# Patient Record
Sex: Female | Born: 1955
Health system: Southern US, Community
[De-identification: ages and names within clinical notes are randomized; demographics above are authoritative.]

## PROBLEM LIST (undated history)

## (undated) DIAGNOSIS — T7840XA Allergy, unspecified, initial encounter: Secondary | ICD-10-CM

## (undated) DIAGNOSIS — I10 Essential (primary) hypertension: Secondary | ICD-10-CM

## (undated) DIAGNOSIS — M810 Age-related osteoporosis without current pathological fracture: Secondary | ICD-10-CM

## (undated) DIAGNOSIS — E119 Type 2 diabetes mellitus without complications: Secondary | ICD-10-CM

## (undated) DIAGNOSIS — K219 Gastro-esophageal reflux disease without esophagitis: Secondary | ICD-10-CM

## (undated) DIAGNOSIS — E785 Hyperlipidemia, unspecified: Secondary | ICD-10-CM

## (undated) DIAGNOSIS — F419 Anxiety disorder, unspecified: Secondary | ICD-10-CM

## (undated) HISTORY — DX: Allergy, unspecified, initial encounter: T78.40XA

## (undated) HISTORY — DX: Essential (primary) hypertension: I10

## (undated) HISTORY — DX: Gastro-esophageal reflux disease without esophagitis: K21.9

## (undated) HISTORY — DX: Hyperlipidemia, unspecified: E78.5

## (undated) HISTORY — PX: OTHER SURGICAL HISTORY: SHX169

## (undated) HISTORY — DX: Anxiety disorder, unspecified: F41.9

## (undated) HISTORY — DX: Age-related osteoporosis without current pathological fracture: M81.0

## (undated) HISTORY — DX: Type 2 diabetes mellitus without complications: E11.9

## (undated) HISTORY — PX: TUBAL LIGATION: SHX77

## (undated) HISTORY — PX: POLYPECTOMY: SHX149

---

## 1998-04-21 ENCOUNTER — Other Ambulatory Visit: Admission: RE | Admit: 1998-04-21 | Discharge: 1998-04-21 | Payer: Self-pay | Admitting: Obstetrics and Gynecology

## 1999-12-21 ENCOUNTER — Encounter: Admission: RE | Admit: 1999-12-21 | Discharge: 1999-12-21 | Payer: Self-pay | Admitting: Obstetrics and Gynecology

## 1999-12-21 ENCOUNTER — Encounter: Payer: Self-pay | Admitting: Obstetrics and Gynecology

## 2000-12-26 ENCOUNTER — Encounter: Payer: Self-pay | Admitting: Obstetrics and Gynecology

## 2000-12-26 ENCOUNTER — Encounter: Admission: RE | Admit: 2000-12-26 | Discharge: 2000-12-26 | Payer: Self-pay | Admitting: Obstetrics and Gynecology

## 2002-01-02 ENCOUNTER — Encounter: Admission: RE | Admit: 2002-01-02 | Discharge: 2002-01-02 | Payer: Self-pay | Admitting: Obstetrics and Gynecology

## 2002-01-02 ENCOUNTER — Encounter: Payer: Self-pay | Admitting: Obstetrics and Gynecology

## 2002-11-13 ENCOUNTER — Ambulatory Visit (HOSPITAL_COMMUNITY): Admission: RE | Admit: 2002-11-13 | Discharge: 2002-11-13 | Payer: Self-pay | Admitting: Gastroenterology

## 2003-01-16 ENCOUNTER — Encounter: Payer: Self-pay | Admitting: Obstetrics and Gynecology

## 2003-01-16 ENCOUNTER — Encounter: Admission: RE | Admit: 2003-01-16 | Discharge: 2003-01-16 | Payer: Self-pay | Admitting: Obstetrics and Gynecology

## 2004-01-26 ENCOUNTER — Encounter: Admission: RE | Admit: 2004-01-26 | Discharge: 2004-01-26 | Payer: Self-pay | Admitting: Obstetrics and Gynecology

## 2005-01-27 ENCOUNTER — Encounter: Admission: RE | Admit: 2005-01-27 | Discharge: 2005-01-27 | Payer: Self-pay | Admitting: Obstetrics and Gynecology

## 2006-02-07 ENCOUNTER — Encounter: Admission: RE | Admit: 2006-02-07 | Discharge: 2006-02-07 | Payer: Self-pay | Admitting: Obstetrics and Gynecology

## 2007-02-21 ENCOUNTER — Encounter: Admission: RE | Admit: 2007-02-21 | Discharge: 2007-02-21 | Payer: Self-pay | Admitting: Obstetrics and Gynecology

## 2008-02-28 ENCOUNTER — Encounter: Admission: RE | Admit: 2008-02-28 | Discharge: 2008-02-28 | Payer: Self-pay | Admitting: Obstetrics and Gynecology

## 2009-03-02 ENCOUNTER — Encounter: Admission: RE | Admit: 2009-03-02 | Discharge: 2009-03-02 | Payer: Self-pay | Admitting: Obstetrics and Gynecology

## 2009-04-23 HISTORY — PX: COLONOSCOPY W/ POLYPECTOMY: SHX1380

## 2010-03-04 ENCOUNTER — Encounter: Admission: RE | Admit: 2010-03-04 | Discharge: 2010-03-04 | Payer: Self-pay | Admitting: Obstetrics and Gynecology

## 2010-09-29 ENCOUNTER — Ambulatory Visit: Payer: Self-pay | Admitting: Internal Medicine

## 2010-09-29 DIAGNOSIS — F411 Generalized anxiety disorder: Secondary | ICD-10-CM | POA: Insufficient documentation

## 2010-09-29 DIAGNOSIS — I10 Essential (primary) hypertension: Secondary | ICD-10-CM | POA: Insufficient documentation

## 2010-09-29 DIAGNOSIS — E559 Vitamin D deficiency, unspecified: Secondary | ICD-10-CM | POA: Insufficient documentation

## 2010-09-29 DIAGNOSIS — K219 Gastro-esophageal reflux disease without esophagitis: Secondary | ICD-10-CM

## 2010-09-30 ENCOUNTER — Ambulatory Visit: Payer: Self-pay | Admitting: Internal Medicine

## 2010-10-04 LAB — CONVERTED CEMR LAB
AST: 37 units/L (ref 0–37)
Alkaline Phosphatase: 110 units/L (ref 39–117)
Basophils Absolute: 0 10*3/uL (ref 0.0–0.1)
CO2: 28 meq/L (ref 19–32)
Chloride: 107 meq/L (ref 96–112)
Cholesterol: 172 mg/dL (ref 0–200)
Eosinophils Relative: 3 % (ref 0.0–5.0)
Glucose, Bld: 98 mg/dL (ref 70–99)
Hemoglobin: 14.4 g/dL (ref 12.0–15.0)
Lymphocytes Relative: 24.1 % (ref 12.0–46.0)
Lymphs Abs: 1.7 10*3/uL (ref 0.7–4.0)
MCV: 88.1 fL (ref 78.0–100.0)
Monocytes Absolute: 0.7 10*3/uL (ref 0.1–1.0)
Monocytes Relative: 10.3 % (ref 3.0–12.0)
Neutrophils Relative %: 62.4 % (ref 43.0–77.0)
Platelets: 248 10*3/uL (ref 150.0–400.0)
Potassium: 4.2 meq/L (ref 3.5–5.1)
RDW: 13.7 % (ref 11.5–14.6)
Sodium: 144 meq/L (ref 135–145)
Total Bilirubin: 0.2 mg/dL — ABNORMAL LOW (ref 0.3–1.2)
Total CHOL/HDL Ratio: 5
Total Protein: 6.4 g/dL (ref 6.0–8.3)
WBC: 7.1 10*3/uL (ref 4.5–10.5)

## 2010-11-18 ENCOUNTER — Other Ambulatory Visit (HOSPITAL_COMMUNITY): Payer: Self-pay | Admitting: *Deleted

## 2010-11-18 ENCOUNTER — Other Ambulatory Visit: Payer: Self-pay

## 2010-11-18 ENCOUNTER — Other Ambulatory Visit: Payer: Self-pay | Admitting: Obstetrics and Gynecology

## 2010-11-18 DIAGNOSIS — R1032 Left lower quadrant pain: Secondary | ICD-10-CM

## 2010-11-23 NOTE — Assessment & Plan Note (Signed)
Summary: new to est/diarhhea/kn   Vital Signs:  Patient profile:   55 year old female Height:      62 inches Weight:      169.8 pounds BMI:     31.17 Temp:     98.3 degrees F oral Pulse rate:   60 / minute Resp:     14 per minute BP sitting:   108 / 64  (left arm) Cuff size:   large  Vitals Entered By: Shonna Chock CMA (September 29, 2010 3:27 PM) CC: New Patient Establish: Diarrhea off/on since Aug   CC:  New Patient Establish: Diarrhea off/on since Aug.  History of Present Illness:    Miranda Gonzales is here to assess diarrhea, present intermittently since 05/2010. She has had  4 separate episodes, the last was 12/3. The first 2 episodes lasted up to 6 hrs & resoved w/o treatment,  the last 2 were responsive to Immodium within 2 hrs. The patient reports >6 stools per day, watery/unformed stools, voluminous stools, mucus in stool, fecal urgency,minimal  fecal soiling, nocturnal diarrhea, fasting diarrhea, and gassiness, but denies blood in stool, greasy stools, malodorous stools, alternating diarrhea/constipation, bloating, and abrupt onset of symptoms.  Associated symptoms include abdominal cramps  with 3rd & 4th episodes.  The patient denies fever, abdominal pain, vomiting, lightheadedness, and increased thirst.  The symptoms are  not worse with specific foods. She has no  risk factors for diarrhea such as  eating suspicious food, antibiotics or travel.  Her father had polyps. She had 2 small polyps 2010, Dr Reece Agar.  Preventive Screening-Counseling & Management  Alcohol-Tobacco     Smoking Status: never  Caffeine-Diet-Exercise     Does Patient Exercise: no  Current Medications (verified): 1)  Benicar Hct 20-12.5 Mg Tabs (Olmesartan Medoxomil-Hctz) .Marland Kitchen.. 1 By Mouth Once Daily 2)  Lexapro 30mg  .... 1 By Mouth Once Daily  Allergies (verified): 1)  ! Pcn  Past History:  Past Medical History: Anxiety, job related  Hypertension GERD  Past Surgical History: Colon  polypectomy Tubal ligation; G 0 P 0, Dr Elana Alm   Family History: Father: polyps, MI @ 55,cns hemorrhage Mother: DM, rectal cancer Siblings: sister:elevated  lipids, HTN  Social History: Occupation: Arts administrator Married Never Smoked Alcohol use-no Regular exercise-no Smoking Status:  never Does Patient Exercise:  no  Review of Systems  The patient denies anorexia, vision loss, decreased hearing, hoarseness, chest pain, dyspnea on exertion, peripheral edema, prolonged cough, hematuria, suspicious skin lesions, unusual weight change, abnormal bleeding, enlarged lymph nodes, and angioedema.         Weight loss of 7# with 1st episode. No  coke  colored urine; no clay colored stool. MS:  Denies joint pain, joint redness, and joint swelling. Derm:  Denies lesion(s) and rash.  Physical Exam  General:  well-nourished,in no acute distress; alert,appropriate and cooperative throughout examination Eyes:  No corneal or conjunctival inflammation noted. Perrla. No icterus; no lid lag Mouth:  Oral mucosa and oropharynx without lesions or exudates.  Teeth in good repair. No pharyngeal erythema.   Neck:  No deformities, masses, or tenderness noted. Lungs:  Normal respiratory effort, chest expands symmetrically. Lungs are clear to auscultation, no crackles or wheezes. Heart:  normal rate, regular rhythm, no gallop, no rub, no JVD, no HJR, and grade 1/2  /6 systolic murmur.   Abdomen:  Bowel sounds positive,abdomen soft and non-tender without masses, organomegaly or hernias noted. Pulses:  R and L carotid,radial,dorsalis pedis and posterior tibial pulses are  full and equal bilaterally Extremities:  No clubbing, cyanosis, edema, or deformity noted with normal full range of motion of all joints.   No onycholysis Neurologic:  alert & oriented X3 and DTRs symmetrical and normal.   Skin:  Intact without suspicious lesions or rashes; slightly damp Cervical Nodes:  No lymphadenopathy noted Axillary  Nodes:  No palpable lymphadenopathy Psych:  memory intact for recent and remote, normally interactive, and good eye contact.     Impression & Recommendations:  Problem # 1:  DIARRHEA (ICD-787.91)  Problem # 2:  HYPERTENSION (ICD-401.9) controlled Her updated medication list for this problem includes:    Benicar Hct 20-12.5 Mg Tabs (Olmesartan medoxomil-hctz) .Marland Kitchen... 1 by mouth once daily  Problem # 3:  VITAMIN D DEFICIENCY (ICD-268.9)  Problem # 4:  GERD (ICD-530.81)  controlled  Problem # 5:  ISCHEMIC HEART DISEASE, PREMATURE, FAMILY HX (ICD-V17.3) Father MI @ 47  Complete Medication List: 1)  Benicar Hct 20-12.5 Mg Tabs (Olmesartan medoxomil-hctz) .Marland Kitchen.. 1 by mouth once daily 2)  Lexapro 30mg   .... 1 by mouth once daily  Patient Instructions: 1)  Use "retroactive" food diary as discussed  if diarrhea recurs . Consider fasting labs: 2)  BMP ; 3)  Hepatic Panel; 4)  Lipid Panel ; 5)  TSH, free T4 ; 6)  CBC w/ Diff . 7)  It is important that you exercise regularly at least 20 minutes 5 times a week. If you develop chest pain, have severe difficulty breathing, or feel very tired , stop exercising immediately and seek medical attention.   Orders Added: 1)  New Patient Level IV [99204]  Appended Document: new to est/diarhhea/kn Patient dropped off note indicating that she checked her meds at home and she is taking 10mg  of Lexapro and not 30mg .Shonna Chock CMA  September 30, 2010 11:19 AM

## 2010-11-24 ENCOUNTER — Ambulatory Visit (HOSPITAL_COMMUNITY)
Admission: RE | Admit: 2010-11-24 | Discharge: 2010-11-24 | Disposition: A | Discharge status: Home / Self Care | Payer: Federal, State, Local not specified - PPO | Source: Ambulatory Visit | Attending: Internal Medicine | Admitting: Internal Medicine

## 2010-11-24 DIAGNOSIS — R1032 Left lower quadrant pain: Secondary | ICD-10-CM

## 2010-12-02 ENCOUNTER — Telehealth (INDEPENDENT_AMBULATORY_CARE_PROVIDER_SITE_OTHER): Payer: Self-pay | Admitting: *Deleted

## 2010-12-06 ENCOUNTER — Other Ambulatory Visit: Payer: Self-pay | Admitting: Internal Medicine

## 2010-12-06 ENCOUNTER — Encounter (INDEPENDENT_AMBULATORY_CARE_PROVIDER_SITE_OTHER): Payer: Self-pay | Admitting: *Deleted

## 2010-12-06 ENCOUNTER — Other Ambulatory Visit (INDEPENDENT_AMBULATORY_CARE_PROVIDER_SITE_OTHER): Payer: Federal, State, Local not specified - PPO

## 2010-12-06 DIAGNOSIS — R740 Nonspecific elevation of levels of transaminase and lactic acid dehydrogenase [LDH]: Secondary | ICD-10-CM

## 2010-12-06 DIAGNOSIS — Z8249 Family history of ischemic heart disease and other diseases of the circulatory system: Secondary | ICD-10-CM

## 2010-12-06 LAB — HEPATIC FUNCTION PANEL
AST: 43 U/L — ABNORMAL HIGH (ref 0–37)
Albumin: 3.6 g/dL (ref 3.5–5.2)
Total Bilirubin: 0.3 mg/dL (ref 0.3–1.2)
Total Protein: 6.6 g/dL (ref 6.0–8.3)

## 2010-12-09 NOTE — Progress Notes (Addendum)
Summary: need info for lab dated 2/13--added corrected info  Phone Note Call from Patient   Caller: Patient Summary of Call: patient called to schedule followup labs for 20/13/2012-------see phone note=12/11----it says:   Recheck fasting AST & ALT in 8-10  weeks with a special fasting cholesterol panel([facility] Panel, 1304X)   272.4, 790.4, V17.3---what do you want to do about [facility] lab info?? Initial call taken by: Jerolyn Shin,  December 02, 2010 9:51 AM  Follow-up for Phone Call        please change [facility] panel to NMR Lipoprofile (272.4, V17.3) Follow-up by: Marga Melnick MD,  December 02, 2010 12:48 PM  Additional Follow-up for Phone Call Additional follow up Details #1::        added corrected info Additional Follow-up by: Jerolyn Shin,  December 03, 2010 3:46 PM     Appended Document: need info for lab dated 2/13--added corrected info I talked to [place] ; patient will not be charged . Therefore do [facility] Panel( the one Kieler orders). She has + FH , elevated liver enzymes (790.4) & increased lipids & is post menopausal . Fasting [facility] Panel, hepatic panel; Codes: 272.4, 790.4, V17.3, 627.9). [employer]

## 2010-12-20 ENCOUNTER — Encounter: Payer: Self-pay | Admitting: Internal Medicine

## 2011-01-03 ENCOUNTER — Encounter: Payer: Self-pay | Admitting: Internal Medicine

## 2011-01-03 ENCOUNTER — Ambulatory Visit (INDEPENDENT_AMBULATORY_CARE_PROVIDER_SITE_OTHER): Payer: Federal, State, Local not specified - PPO | Admitting: Internal Medicine

## 2011-01-03 DIAGNOSIS — E785 Hyperlipidemia, unspecified: Secondary | ICD-10-CM

## 2011-01-03 DIAGNOSIS — Z8249 Family history of ischemic heart disease and other diseases of the circulatory system: Secondary | ICD-10-CM

## 2011-01-11 NOTE — Assessment & Plan Note (Signed)
Summary: review [facility]/kn   Vital Signs:  Patient profile:   55 year old female Weight:      168 pounds BMI:     30.84 Pulse rate:   72 / minute Resp:     12 per minute BP sitting:   112 / 70  (left arm) Cuff size:   large  Vitals Entered By: Shonna Chock CMA (January 03, 2011 3:30 PM) CC: Discuss [facility] lab, Hypertension Management, Lipid Management   CC:  Discuss [facility] lab, Hypertension Management, and Lipid Management.  History of Present Illness:     [facility] Panel reviewed ; major concern is HDL . He  denies the following symptoms: chest pain/pressure, dypsnea, palpitations, syncope, and pedal edema.  Dietary compliance has been fair.  The patient reports no exercise.  Adjunctive measures currently used by the patient include fiber, folic acid, and fish oil supplements. Her  father had MI @ 7.  Hypertension History:      Positive major cardiovascular risk factors include female age 13 years old or older, hyperlipidemia, hypertension, and family history for ischemic heart disease (males less than 56 years old).  Negative major cardiovascular risk factors include no history of diabetes and non-tobacco-user status.        Further assessment for target organ damage reveals no history of ASHD, stroke/TIA, or peripheral vascular disease.    Lipid Management History:      Positive NCEP/ATP III risk factors include female age 76 years old or older, early menopause without estrogen hormone replacement, HDL cholesterol less than 40, family history for ischemic heart disease (males less than 79 years old), and hypertension.  Negative NCEP/ATP III risk factors include non-diabetic, non-tobacco-user status, no ASHD (atherosclerotic heart disease), no prior stroke/TIA, no peripheral vascular disease, and no history of aortic aneurysm.      Current Medications (verified): 1)  Benicar Hct 20-12.5 Mg Tabs (Olmesartan Medoxomil-Hctz) .Marland Kitchen.. 1 By Mouth Once Daily 2)  Lexapro 10 Mg Tabs  (Escitalopram Oxalate) .Marland Kitchen.. 1 By Mouth Once Daily  Allergies: 1)  ! Pcn  Past History:  Past Medical History: Anxiety, job related  Hypertension GERD Hyperlipidemia: Fr[place] Study LDL goal = < 130.  Physical Exam  General:  well-nourished;alert,appropriate and cooperative throughout examination Lungs:  Normal respiratory effort, chest expands symmetrically. Lungs are clear to auscultation, no crackles or wheezes. Heart:  Normal rate and regular rhythm. S1 and S2 normal without gallop, murmur, click, rub or other extra sounds. Pulses:  R and L carotid,radial,dorsalis pedis and posterior tibial pulses are full and equal bilaterally Extremities:  No clubbing, cyanosis, edema.   Impression & Recommendations:  Problem # 1:  HYPERLIPIDEMIA (ICD-272.4)  Problem # 2:  ISCHEMIC HEART DISEASE, PREMATURE, FAMILY HX (ICD-V17.3)  Complete Medication List: 1)  Benicar Hct 20-12.5 Mg Tabs (Olmesartan medoxomil-hctz) .Marland Kitchen.. 1 by mouth once daily 2)  Lexapro 10 Mg Tabs (Escitalopram oxalate) .Marland Kitchen.. 1 by mouth once daily  Hypertension Assessment/Plan:      The patient's hypertensive risk group is category B: At least one risk factor (excluding diabetes) with no target organ damage.  Her calculated 10 year risk of coronary heart disease is 11 %.  Today's blood pressure is 112/70.    Lipid Assessment/Plan:      Based on NCEP/ATP III, the patient's risk factor category is "2 or more risk factors and a calculated 10 year CAD risk of < 20%".  The patient's lipid goals are as follows: Total cholesterol goal is 200; LDL  cholesterol goal is 130; HDL cholesterol goal is 40; Triglyceride goal is 150.    Patient Instructions: 1)  Focus on HDL raising interventions as discussed. Consider NMR Lipoprofile in 2013 to optimally assess risk. Stop vitamin E & Beta carotene.   Orders Added: 1)  Est. Patient Level III [04540]

## 2011-02-01 ENCOUNTER — Ambulatory Visit (INDEPENDENT_AMBULATORY_CARE_PROVIDER_SITE_OTHER): Payer: Federal, State, Local not specified - PPO | Admitting: Internal Medicine

## 2011-02-01 ENCOUNTER — Encounter: Payer: Self-pay | Admitting: Internal Medicine

## 2011-02-01 VITALS — BP 116/70 | HR 72 | Temp 98.2°F | Wt 162.8 lb

## 2011-02-01 DIAGNOSIS — J029 Acute pharyngitis, unspecified: Secondary | ICD-10-CM

## 2011-02-01 NOTE — Progress Notes (Signed)
  Subjective:    Patient ID: Miranda Gonzales, female    DOB: [birth year], 55 y.o.   MRN: [id number]  HPI SORE THROAT   Onset: 01/28/2011  Severity: 2 on 10 scale Better with: Echinacea & vit C did not help;but  ST is improving  Symptoms  Fever: no    Cough/URI sxs: yes, scant sputum , mainly dry Myalgias: no Headache: no Rash: no Swollen neck glands: yes    Recent Strep Exposure: no LUQ pain: no Heartburn/brash: no Allergy Sxs: no  Red Flags  Breathing difficulty: no  Trismus: no                                                                                                                                                    The Centor criteria for pharyngitis which include fever, pharyngeal exudate, tender cervical  lymphadenopathy , and absence of cough were assessed.      Review of Systems     Objective:   Physical Exam General appearance is one of good health and nourishment.See current vital signs Ears:  External ear exam shows no significant lesions or deformities.  Otoscopic examination reveals clear canals, tympanic membranes are intact bilaterally without bulging, retraction, inflammation or discharge. Eye - Pupils Equal Round Reactive to light, Conjunctiva without redness or discharge Oral exam: Dental hygiene is good; lips and gums are healthy appearing.There is no oropharyngeal erythema or exudate noted. Heart:  Normal rate and regular rhythm. S1 and S2 normal without gallop, murmur, click, rub or other extra sounds.                                                                                               Lungs:Chest clear to auscultation; no wheezes, rhonchi,rales ,or rubs present.No increased work of breathing. Lymphatic: No lymphadenopathy is noted about the head, neck,  Or axilla areas.        Assessment & Plan:  #1 pharyngitis , viral etiology   Plan: Zinc lozenges, vitamin C 2000 mg, Echinacea as discussed.

## 2011-02-01 NOTE — Patient Instructions (Signed)
Take the Immune Support as discussed ; report pain , pus & fever.

## 2011-02-03 ENCOUNTER — Telehealth: Payer: Self-pay | Admitting: *Deleted

## 2011-02-03 MED ORDER — SULFAMETHOXAZOLE-TMP DS 800-160 MG PO TABS: 1.0000 | ORAL_TABLET | Freq: Two times a day (BID) | ORAL | Status: AC

## 2011-02-03 NOTE — Telephone Encounter (Signed)
Left detailed msg rx sent to pharmacy .

## 2011-02-03 NOTE — Telephone Encounter (Signed)
Generic Septra DS one every 12 hours with  of 8 ounces of water( dispense 20 )

## 2011-02-04 ENCOUNTER — Other Ambulatory Visit: Payer: Self-pay | Admitting: Obstetrics and Gynecology

## 2011-02-04 DIAGNOSIS — Z1231 Encounter for screening mammogram for malignant neoplasm of breast: Secondary | ICD-10-CM

## 2011-02-07 ENCOUNTER — Other Ambulatory Visit (HOSPITAL_COMMUNITY): Payer: Self-pay | Admitting: Obstetrics and Gynecology

## 2011-02-08 ENCOUNTER — Other Ambulatory Visit (HOSPITAL_COMMUNITY): Payer: Self-pay | Admitting: Obstetrics and Gynecology

## 2011-02-10 ENCOUNTER — Other Ambulatory Visit (HOSPITAL_COMMUNITY): Payer: Self-pay | Admitting: Obstetrics and Gynecology

## 2011-02-14 ENCOUNTER — Other Ambulatory Visit (HOSPITAL_COMMUNITY): Payer: Self-pay | Admitting: Obstetrics and Gynecology

## 2011-03-07 ENCOUNTER — Ambulatory Visit
Admission: RE | Admit: 2011-03-07 | Discharge: 2011-03-07 | Disposition: A | Discharge status: Home / Self Care | Payer: Federal, State, Local not specified - PPO | Source: Ambulatory Visit | Attending: Obstetrics and Gynecology | Admitting: Obstetrics and Gynecology

## 2011-03-07 DIAGNOSIS — Z1231 Encounter for screening mammogram for malignant neoplasm of breast: Secondary | ICD-10-CM

## 2011-03-11 NOTE — Op Note (Signed)
NAME:  [name], [name] NO.:  [id number]   MEDICAL RECORD NO.:  [id number]                   PATIENT TYPE:  AMB   LOCATION:  ENDO                                 FACILITY:  [facility]   PHYSICIAN:  Miranda Gonzales, M.D.                DATE OF BIRTH:  05-13-1956   DATE OF PROCEDURE:  11/13/2002  DATE OF DISCHARGE:                                 OPERATIVE REPORT   PROCEDURE:  Colonoscopy.   REFERRED BY:  Miranda Gonzales, M.D.   INDICATIONS FOR PROCEDURE:  Miranda Gonzales is a 55 year old female born  12-25-1955. Miranda Gonzales has unexplained iron deficiency anemia based on a  serum Ferritin 3 NG/ML, iron saturation 3%, and hemoglobin 10 gm. After  three weeks of iron therapy, her hemoglobin rose from 10 gm to 11 gm. [name] submitted stool for guaiac testing and her stools were negative for  blood. Miranda Gonzales has not had a menstrual period since March 2003. She does  give blood to the [employer] on a regular basis.   ALLERGIES:  PENICILLIN.   CURRENT MEDICATIONS:  Iron, multivitamin, flax seed oil, soy.   PAST MEDICAL HISTORY:  Tubal ligation.   FAMILY HISTORY:  Negative for colon cancer.   HABITS:  Miranda Gonzales does not smoke cigarettes or consume alcohol.   ENDOSCOPIST:  Charolett Bumpers, M.D.   PREMEDICATION:  Versed 10 mg, Demerol 50 mg .   ENDOSCOPE:  Olympus pediatric colonoscope.   DESCRIPTION OF PROCEDURE:  After obtaining informed consent, Miranda Gonzales was  placed in the left lateral decubitus position. I administered intravenous  Demerol and intravenous Versed to achieve conscious sedation for the  procedure. The patient's blood pressure, oxygen saturation and cardiac  rhythm were monitored throughout the procedure and documented in the medical  record.   Anal inspection was normal. Digital rectal exam was normal. The Olympus  pediatric video colonoscope was introduced into the rectum and advanced to  the cecum. Colonic  preparation for the exam today was excellent.   RECTUM:  Normal.   SIGMOID COLON AND DESCENDING COLON:  Normal.   SPLENIC FLEXURE:  Normal.   TRANSVERSE COLON:  Normal.   HEPATIC FLEXURE:  Normal.   ASCENDING COLON:  Normal.   CECUM AND ILEOCECAL VALVE:  Normal.   ASSESSMENT:  Normal proctocolonoscopy to the cecum. No endoscopic evidence  for the presence of gastrointestinal bleeding or colorectal neoplasia.                                               Miranda Gonzales, M.D.    MJ/MEDQ  D:  11/13/2002  T:  11/13/2002  Job:  [id number]   cc:   Georgann Housekeeper, M.D.  [address]  [place]  [place]  [place] [address]  Fax: [phone]   S. Kyra Gonzales, M.D.  [phone] [address]  [place]  [place] [address]  Fax: [phone]

## 2011-04-28 ENCOUNTER — Other Ambulatory Visit: Payer: Self-pay | Admitting: Internal Medicine

## 2011-06-29 ENCOUNTER — Other Ambulatory Visit: Payer: Self-pay | Admitting: Internal Medicine

## 2011-06-29 MED ORDER — ESCITALOPRAM OXALATE 10 MG PO TABS: 10.0000 mg | ORAL_TABLET | Freq: Every day | ORAL | Status: DC

## 2011-06-29 NOTE — Telephone Encounter (Signed)
Rx sent in

## 2011-08-23 ENCOUNTER — Other Ambulatory Visit: Payer: Self-pay | Admitting: Internal Medicine

## 2011-12-19 ENCOUNTER — Other Ambulatory Visit: Payer: Self-pay | Admitting: Internal Medicine

## 2011-12-19 NOTE — Telephone Encounter (Signed)
Patient needs to schedule a CPX for 01/2012 or after

## 2011-12-19 NOTE — Telephone Encounter (Signed)
Patient needs to schedule a CPX for 01/2012 or after in order to continue receiving refills on med

## 2012-01-21 ENCOUNTER — Other Ambulatory Visit: Payer: Self-pay | Admitting: Internal Medicine

## 2012-01-23 ENCOUNTER — Other Ambulatory Visit: Payer: Self-pay | Admitting: Internal Medicine

## 2012-02-03 ENCOUNTER — Other Ambulatory Visit: Payer: Self-pay | Admitting: Obstetrics and Gynecology

## 2012-02-03 DIAGNOSIS — Z1231 Encounter for screening mammogram for malignant neoplasm of breast: Secondary | ICD-10-CM

## 2012-02-28 ENCOUNTER — Ambulatory Visit: Payer: Federal, State, Local not specified - PPO | Admitting: Internal Medicine

## 2012-03-05 ENCOUNTER — Ambulatory Visit: Payer: Federal, State, Local not specified - PPO | Admitting: Internal Medicine

## 2012-03-07 ENCOUNTER — Ambulatory Visit
Admission: RE | Admit: 2012-03-07 | Discharge: 2012-03-07 | Disposition: A | Discharge status: Home / Self Care | Payer: Federal, State, Local not specified - PPO | Source: Ambulatory Visit | Attending: Obstetrics and Gynecology | Admitting: Obstetrics and Gynecology

## 2012-03-07 DIAGNOSIS — Z1231 Encounter for screening mammogram for malignant neoplasm of breast: Secondary | ICD-10-CM

## 2012-03-12 ENCOUNTER — Encounter: Payer: Self-pay | Admitting: Internal Medicine

## 2012-03-12 ENCOUNTER — Ambulatory Visit (INDEPENDENT_AMBULATORY_CARE_PROVIDER_SITE_OTHER): Payer: Federal, State, Local not specified - PPO | Admitting: Internal Medicine

## 2012-03-12 VITALS — BP 110/70 | HR 75 | Ht 62.03 in | Wt 175.4 lb

## 2012-03-12 DIAGNOSIS — F411 Generalized anxiety disorder: Secondary | ICD-10-CM

## 2012-03-12 DIAGNOSIS — I10 Essential (primary) hypertension: Secondary | ICD-10-CM

## 2012-03-12 LAB — ALT: ALT: 49 U/L — ABNORMAL HIGH (ref 0–35)

## 2012-03-12 LAB — BASIC METABOLIC PANEL
BUN: 16 mg/dL (ref 6–23)
Calcium: 9.2 mg/dL (ref 8.4–10.5)
Glucose, Bld: 111 mg/dL — ABNORMAL HIGH (ref 70–99)
Sodium: 139 mEq/L (ref 135–145)

## 2012-03-12 NOTE — Patient Instructions (Addendum)
To prevent palpitations or premature beats, avoid stimulants such as decongestants, diet pills, nicotine, or caffeine (coffee, tea, cola, or chocolate) to excess.  Please try to go on My Chart within the next 24 hours to allow me to release the results directly to you.

## 2012-03-12 NOTE — Progress Notes (Signed)
Subjective:    Patient ID: Miranda Gonzales, female    DOB: Mar 11, 1956, 56 y.o.   MRN: [id number]  HPI She is here for a form completion related to her job. 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 Her training has not been detailed in this area; her references or "knowledge articles " which she has trouble comprehending.  These job stresses result in significant anxiety with associated physical signs and symptoms.   Specifically she notices pounding headache and tachycardia. She also has abdominal cramping with subsequent bowel change, loose to watery stool. This also if impacted her sleep. She states that she "cries all the time" and stays anxious/nervous.  Her initial job related symptoms were in 2011 and did improve with the SSRI Lexapro from her gynecologist.  She is concerned about the risk to her health long-term. Her father had heart disease her mother had 2 strokes.  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 She wishes to pursue this even though it would possibly involve down for a period.  She denies a constellation of headache, chest pain, flushing, and diarrhea    Review of Systems Constitutional: No fever, chills, significant weight change, fatigue, weakness or night sweats Eyes: No  blurred vision, double vision, or loss of vision  Cardiovascular: no chest pain,  syncope, nausea, sweating, claudication, or edema  Respiratory: No   dyspnea, paroxysmal nocturnal dyspnea, significant snoring, or  apnea    Gastrointestinal: No dysphagia, abdominal pain,  rectal bleeding, melena,  stool incontinence or jaundice Genitourinary: No dysuria,hematuria, pyuria, frequency Dermatologic: No rash, pruritus, urticaria, or change in color or temperature of skin Neurologic: No  vertigo, limb weakness,  gait disturbance,  memory loss, numbness or tingling Endocrine: No change in  hair/skin/ nails, excessive thirst, excessive hunger, excessive urination, or unexplained fatigue Hematologic/lymphatic: No bruising, lymphadenopathy,or  abnormal clotting      Objective:   Physical Exam Gen.: Healthy and well-nourished in appearance. Alert, appropriate and cooperative throughout exam. Head: Normocephalic without obvious abnormalities  Eyes: No corneal or conjunctival inflammation noted. No lid lag or proptosis. EOMI Mouth: Oral mucosa and oropharynx reveal no lesions or exudates. Teeth in good repair. Neck: No deformities, masses, or tenderness noted. Range of motion & thyroid normal  Lungs: Normal respiratory effort; chest expands symmetrically. Lungs are clear to auscultation without rales, wheezes, or increased work of breathing. Heart: Normal rate and rhythm. Normal S1 and S2. No gallop, click, or rub. Grade 1/6 systolic murmur . Abdomen: Bowel sounds normal; abdomen soft and nontender. No masses, organomegaly or hernias noted.Aorta palpable ; no AAA                                                                   Musculoskeletal/extremities: Tone & strength  normal.Joints normal. Nail health  good. Vascular: Carotid, radial artery, dorsalis pedis and  posterior tibial pulses are full and equal. No bruits present. Neurologic: Alert and oriented x3. Deep tendon reflexes symmetrical and normal. Fine tremor of hands Derm:no suspicious lesions or rashes. Lymph: No cervical, axillary lymphadenopathy present. Psych: Mood and affect are normal. Normally interactive  Assessment & Plan:

## 2012-03-12 NOTE — Assessment & Plan Note (Signed)
Medically it would be in her best interest to change her job  description.   Historically and clinically pheochromocytoma is not suggested Thyroid function tests will be performed along with a basic metabolic profile.

## 2012-03-12 NOTE — Assessment & Plan Note (Signed)
Blood pressure is well-controlled today; BMET will be checked

## 2012-03-13 LAB — T4, FREE: Free T4: 0.78 ng/dL (ref 0.60–1.60)

## 2012-03-13 LAB — TSH: TSH: 0.95 u[IU]/mL (ref 0.35–5.50)

## 2012-04-20 ENCOUNTER — Other Ambulatory Visit: Payer: Self-pay | Admitting: Internal Medicine

## 2012-05-18 ENCOUNTER — Other Ambulatory Visit: Payer: Self-pay | Admitting: Internal Medicine

## 2012-10-04 ENCOUNTER — Encounter: Payer: Self-pay | Admitting: Internal Medicine

## 2012-10-04 ENCOUNTER — Ambulatory Visit (INDEPENDENT_AMBULATORY_CARE_PROVIDER_SITE_OTHER): Payer: Federal, State, Local not specified - PPO | Admitting: Internal Medicine

## 2012-10-04 VITALS — BP 118/76 | HR 95 | Temp 97.9°F | Wt 172.4 lb

## 2012-10-04 DIAGNOSIS — A088 Other specified intestinal infections: Secondary | ICD-10-CM

## 2012-10-04 DIAGNOSIS — A084 Viral intestinal infection, unspecified: Secondary | ICD-10-CM

## 2012-10-04 NOTE — Progress Notes (Signed)
  Subjective:    Patient ID: Miranda Gonzales, female    DOB: 08/20/1956, 56 y.o.   MRN: [id number]  HPI Symptoms began 07/03/12 as a queasy sensation and some chills without fever. As of 9/11 she had had some cramps loose-watery stools on 3 occasions.  She has had the flu shot this year. She denies associated arthralgias or myalgias.  She's been out of work for 3 days ago and she feels that she will be well enough to return to work in the morning      Review of Systems She denies frontal headache, facial pain, sore throat, nasal purulence, cough, sputum production, dysuria, pyuria, or hematuria.     Objective:   Physical Exam General appearance:good health ;well nourished; no acute distress or increased work of breathing is present.  No  lymphadenopathy about the head, neck, or axilla noted.   Eyes: No conjunctival inflammation or lid edema is present. There is no scleral icterus.    Nose:  External nasal examination shows no deformity or inflammation. Nasal mucosa are pink and moist without lesions or exudates. No septal dislocation or deviation.No obstruction to airflow.   Oral exam: Dental hygiene is good; lips and gums are healthy appearing.There is no oropharyngeal erythema or exudate noted.   Neck:  No deformities,  masses, or tenderness noted.   Supple with full range of motion without pain.   Heart:  Normal rate and regular rhythm. S1 and S2 normal without gallop, murmur, click, rub or other extra sounds.   Lungs:Chest clear to auscultation; no wheezes, rhonchi,rales ,or rubs present.No increased work of breathing.    Abdomen: Abdomen soft without organomegaly or masses. Bowel sounds are hyperactive  Extremities:  No cyanosis, edema, or clubbing  noted    Skin: Warm & dry w/o jaundice or tenting.          Assessment & Plan:  #1 viral gastroenteritis, resolving  Plan: See recommendations

## 2012-10-04 NOTE — Patient Instructions (Addendum)
Stay on clear liquids for 48-72 hours or until bowels are normal.This would include  jello, sherbert (NOT ice cream), Lipton's chicken noodle soup(NOT cream based soups),Gatorade Lite, flat Ginger ale (without High Fructose Corn Syrup),dry toast or crackers, baked potato.No milk , dairy or grease until bowels are formed. Align , a [employer] , daily if stools are loose. Immodium AD for frankly watery stool. Report increasing pain, fever or rectal bleeding . You medically can return to work  08/05/2012

## 2012-10-20 ENCOUNTER — Other Ambulatory Visit: Payer: Self-pay | Admitting: Internal Medicine

## 2012-10-22 NOTE — Telephone Encounter (Signed)
Refill done.  

## 2012-11-15 ENCOUNTER — Other Ambulatory Visit: Payer: Self-pay | Admitting: Internal Medicine

## 2012-11-29 ENCOUNTER — Ambulatory Visit (INDEPENDENT_AMBULATORY_CARE_PROVIDER_SITE_OTHER): Payer: Federal, State, Local not specified - PPO | Admitting: Internal Medicine

## 2012-11-29 VITALS — BP 128/70 | HR 80 | Temp 98.1°F | Wt 180.0 lb

## 2012-11-29 DIAGNOSIS — J029 Acute pharyngitis, unspecified: Secondary | ICD-10-CM

## 2012-11-29 DIAGNOSIS — R05 Cough: Secondary | ICD-10-CM

## 2012-11-29 DIAGNOSIS — J069 Acute upper respiratory infection, unspecified: Secondary | ICD-10-CM

## 2012-11-29 MED ORDER — DOXYCYCLINE HYCLATE 100 MG PO TABS: 100.0000 mg | ORAL_TABLET | Freq: Two times a day (BID) | ORAL | Status: DC

## 2012-11-29 MED ORDER — FLUTICASONE PROPIONATE 50 MCG/ACT NA SUSP: 1.0000 | Freq: Two times a day (BID) | NASAL | Status: DC | PRN

## 2012-11-29 MED ORDER — HYDROCODONE-HOMATROPINE 5-1.5 MG/5ML PO SYRP: 5.0000 mL | ORAL_SOLUTION | Freq: Four times a day (QID) | ORAL | Status: DC | PRN

## 2012-11-29 NOTE — Progress Notes (Signed)
  Subjective:    Patient ID: Miranda Gonzales, female    DOB: April 23, 1956, 57 y.o.   MRN: [id number]  HPI The respiratory tract symptoms began 11/26/12 as sore throat then rhinitis, head congestion  & chest congestion  by 2/4 with ? colored sputum. Sputum is swallowed. Significant active  associated symptoms include persistent sore throat,scant green nasal purulence (mainly clear),  & ear pressure. Cough is not associated with  shortness of breath and wheezing . Some sneezing. Flu shot  current        Treatment with  Alka Seltzer Cold & throat lozenges w/o effect.   There is no history of asthma , seasonal, or perennial allergies. The patient had never smoked .                   Review of SystemsSymptoms not present include frontal headache, facial pain, dental pain,  and otic discharge Fever,chills & sweats not present . Itchy , watery eyes  were not noted.  Myalgias and arthralgias were not present     Objective:   Physical Exam General appearance:good health ;well nourished; no acute distress or increased work of breathing is present.  No  lymphadenopathy about the head, neck, or axilla noted.  Eyes: No conjunctival inflammation or lid edema is present. Ears:  External ear exam shows no significant lesions or deformities.  Otoscopic examination reveals clear canals, tympanic membranes are intact bilaterally without bulging, retraction, inflammation or discharge. Nose:  External nasal examination shows no deformity or inflammation. Nasal mucosa are pink and moist without lesions or exudates. No septal dislocation or deviation.No obstruction to airflow.  Oral exam: Dental hygiene is good; lips and gums are healthy appearing.There is no oropharyngeal erythema or exudate noted.  Neck:  No deformities, masses, or tenderness noted.    Heart:  Normal rate and regular rhythm. S1 and S2 normal without gallop, murmur, click, rub or other extra sounds.  Lungs:Chest clear to  auscultation; no wheezes, rhonchi,rales ,or rubs present.No increased work of breathing.  Brassy cough Extremities:  No cyanosis, edema, or clubbing  noted  Skin: Warm & dry .        Assessment & Plan:  #1 rhinosinusitis without significant bronchitis  Plan: Nasal hygiene interventions discussed. See prescription medications

## 2012-11-29 NOTE — Patient Instructions (Addendum)
Zicam Melts or Zinc lozenges for scratchy throat ; vitamin C 2000 mg daily; & Echinacea for 4-7 days.  Plain Mucinex (NOT D) for thick secretions ;force NON dairy fluids .   Nasal cleansing in the shower as discussed with lather of mild shampoo.After 10 seconds wash off lather while  exhaling through nostrils. Make sure that all residual soap is removed to prevent irritation.  Fluticasone 1 spray in each nostril twice a day as needed. Use the "crossover" technique into opposite nostril spraying toward opposite ear @ 45 degree angle, not straight up into nostril.  Use a Neti pot daily only  as needed for significant sinus congestion; going from open side to congested side . Plain Allegra (NOT D )  160 daily , Loratidine 10 mg , OR Zyrtec 10 mg @ bedtime  as needed for itchy eyes & sneezing.

## 2012-12-08 ENCOUNTER — Other Ambulatory Visit: Payer: Self-pay

## 2013-01-26 ENCOUNTER — Other Ambulatory Visit: Payer: Self-pay | Admitting: Internal Medicine

## 2013-01-30 ENCOUNTER — Other Ambulatory Visit: Payer: Self-pay

## 2013-01-30 DIAGNOSIS — Z1231 Encounter for screening mammogram for malignant neoplasm of breast: Secondary | ICD-10-CM

## 2013-02-14 ENCOUNTER — Other Ambulatory Visit: Payer: Self-pay | Admitting: Internal Medicine

## 2013-03-08 ENCOUNTER — Ambulatory Visit
Admission: RE | Admit: 2013-03-08 | Discharge: 2013-03-08 | Disposition: A | Discharge status: Home / Self Care | Payer: Federal, State, Local not specified - PPO | Source: Ambulatory Visit

## 2013-03-08 ENCOUNTER — Ambulatory Visit: Payer: Federal, State, Local not specified - PPO

## 2013-03-08 DIAGNOSIS — Z1231 Encounter for screening mammogram for malignant neoplasm of breast: Secondary | ICD-10-CM

## 2013-07-22 ENCOUNTER — Other Ambulatory Visit: Payer: Self-pay | Admitting: Internal Medicine

## 2013-07-23 ENCOUNTER — Other Ambulatory Visit: Payer: Self-pay | Admitting: *Deleted

## 2013-07-23 ENCOUNTER — Other Ambulatory Visit: Payer: Self-pay | Admitting: Internal Medicine

## 2013-07-23 DIAGNOSIS — I10 Essential (primary) hypertension: Secondary | ICD-10-CM

## 2013-07-23 MED ORDER — OLMESARTAN MEDOXOMIL-HCTZ 20-12.5 MG PO TABS: ORAL_TABLET | ORAL | Status: DC

## 2013-07-23 NOTE — Telephone Encounter (Signed)
Benicar refill sent to pharmacy. 

## 2013-07-23 NOTE — Telephone Encounter (Signed)
Benicar refilled for 1 month. OV is needed.

## 2013-08-21 ENCOUNTER — Other Ambulatory Visit: Payer: Self-pay | Admitting: *Deleted

## 2013-08-21 ENCOUNTER — Other Ambulatory Visit: Payer: Self-pay | Admitting: Internal Medicine

## 2013-08-21 MED ORDER — ESCITALOPRAM OXALATE 10 MG PO TABS: ORAL_TABLET | ORAL | Status: DC

## 2013-08-21 NOTE — Telephone Encounter (Signed)
Escitalopram refill sent to pharmacy #[id number], 0 refills. OV due

## 2013-08-29 ENCOUNTER — Other Ambulatory Visit: Payer: Self-pay

## 2013-09-20 ENCOUNTER — Other Ambulatory Visit: Payer: Self-pay | Admitting: Internal Medicine

## 2013-09-20 NOTE — Telephone Encounter (Signed)
Benicar refilled per protocol. OV due

## 2013-10-24 ENCOUNTER — Other Ambulatory Visit: Payer: Self-pay | Admitting: Internal Medicine

## 2013-10-25 NOTE — Telephone Encounter (Signed)
Benicar and Lexapro refilled for one month. Pt needs OV. JG//CMA

## 2013-11-21 ENCOUNTER — Other Ambulatory Visit: Payer: Self-pay | Admitting: Internal Medicine

## 2013-11-22 NOTE — Telephone Encounter (Signed)
Lexapro refilled for one month. OV due

## 2013-11-23 ENCOUNTER — Other Ambulatory Visit: Payer: Self-pay | Admitting: Internal Medicine

## 2013-11-25 NOTE — Telephone Encounter (Signed)
Benicar refilled per protocol. OV due. JG//CMA

## 2013-12-21 ENCOUNTER — Other Ambulatory Visit: Payer: Self-pay | Admitting: Internal Medicine

## 2013-12-27 ENCOUNTER — Other Ambulatory Visit: Payer: Self-pay | Admitting: *Deleted

## 2013-12-27 MED ORDER — OLMESARTAN MEDOXOMIL-HCTZ 20-12.5 MG PO TABS: ORAL_TABLET | ORAL | Status: DC

## 2013-12-27 MED ORDER — ESCITALOPRAM OXALATE 10 MG PO TABS: ORAL_TABLET | ORAL | Status: DC

## 2013-12-27 NOTE — Telephone Encounter (Signed)
Rx's sent to the pharmacy by e-script.  Pt needs complete physical.//AB/CMA

## 2014-01-19 ENCOUNTER — Other Ambulatory Visit: Payer: Self-pay | Admitting: Internal Medicine

## 2014-01-20 ENCOUNTER — Other Ambulatory Visit: Payer: Self-pay | Admitting: Internal Medicine

## 2014-01-20 MED ORDER — ESCITALOPRAM OXALATE 10 MG PO TABS: ORAL_TABLET | ORAL | Status: DC

## 2014-02-19 ENCOUNTER — Other Ambulatory Visit: Payer: Self-pay | Admitting: Internal Medicine

## 2014-02-24 ENCOUNTER — Other Ambulatory Visit: Payer: Self-pay | Admitting: Internal Medicine

## 2014-02-24 ENCOUNTER — Other Ambulatory Visit: Payer: Self-pay

## 2014-02-24 ENCOUNTER — Other Ambulatory Visit: Payer: Self-pay | Admitting: *Deleted

## 2014-02-24 DIAGNOSIS — Z1231 Encounter for screening mammogram for malignant neoplasm of breast: Secondary | ICD-10-CM

## 2014-02-24 MED ORDER — OLMESARTAN MEDOXOMIL-HCTZ 20-12.5 MG PO TABS: ORAL_TABLET | ORAL | Status: DC

## 2014-02-27 ENCOUNTER — Telehealth: Payer: Self-pay | Admitting: Internal Medicine

## 2014-02-27 ENCOUNTER — Encounter: Payer: Self-pay | Admitting: Internal Medicine

## 2014-02-27 ENCOUNTER — Ambulatory Visit (INDEPENDENT_AMBULATORY_CARE_PROVIDER_SITE_OTHER): Payer: Federal, State, Local not specified - PPO | Admitting: Internal Medicine

## 2014-02-27 ENCOUNTER — Other Ambulatory Visit (INDEPENDENT_AMBULATORY_CARE_PROVIDER_SITE_OTHER): Payer: Federal, State, Local not specified - PPO

## 2014-02-27 VITALS — BP 114/80 | HR 89 | Temp 97.9°F | Wt 193.8 lb

## 2014-02-27 DIAGNOSIS — E785 Hyperlipidemia, unspecified: Secondary | ICD-10-CM

## 2014-02-27 DIAGNOSIS — E559 Vitamin D deficiency, unspecified: Secondary | ICD-10-CM

## 2014-02-27 DIAGNOSIS — R7309 Other abnormal glucose: Secondary | ICD-10-CM

## 2014-02-27 DIAGNOSIS — M949 Disorder of cartilage, unspecified: Secondary | ICD-10-CM

## 2014-02-27 DIAGNOSIS — M899 Disorder of bone, unspecified: Secondary | ICD-10-CM

## 2014-02-27 DIAGNOSIS — I10 Essential (primary) hypertension: Secondary | ICD-10-CM

## 2014-02-27 DIAGNOSIS — K219 Gastro-esophageal reflux disease without esophagitis: Secondary | ICD-10-CM

## 2014-02-27 DIAGNOSIS — M8580 Other specified disorders of bone density and structure, unspecified site: Secondary | ICD-10-CM | POA: Insufficient documentation

## 2014-02-27 LAB — LIPID PANEL
CHOLESTEROL: 186 mg/dL (ref 0–200)
HDL: 40.6 mg/dL (ref >39.00)
LDL Cholesterol: 133 mg/dL — ABNORMAL HIGH (ref 0–99)
Total CHOL/HDL Ratio: 5
Triglycerides: 63 mg/dL (ref 0.0–149.0)
VLDL: 12.6 mg/dL (ref 0.0–40.0)

## 2014-02-27 LAB — CBC WITH DIFFERENTIAL/PLATELET
BASOS ABS: 0 10*3/uL (ref 0.0–0.1)
BASOS PCT: 0.2 % (ref 0.0–3.0)
EOS ABS: 0.2 10*3/uL (ref 0.0–0.7)
Eosinophils Relative: 2.9 % (ref 0.0–5.0)
HEMATOCRIT: 44.3 % (ref 36.0–46.0)
HEMOGLOBIN: 14.5 g/dL (ref 12.0–15.0)
LYMPHS ABS: 1.5 10*3/uL (ref 0.7–4.0)
Lymphocytes Relative: 20.4 % (ref 12.0–46.0)
MCHC: 32.8 g/dL (ref 30.0–36.0)
MCV: 86 fl (ref 78.0–100.0)
Monocytes Absolute: 0.8 10*3/uL (ref 0.1–1.0)
Monocytes Relative: 10.5 % (ref 3.0–12.0)
NEUTROS ABS: 4.9 10*3/uL (ref 1.4–7.7)
Neutrophils Relative %: 66 % (ref 43.0–77.0)
Platelets: 310 10*3/uL (ref 150.0–400.0)
RBC: 5.15 Mil/uL — AB (ref 3.87–5.11)
RDW: 13.7 % (ref 11.5–15.5)
WBC: 7.5 10*3/uL (ref 4.0–10.5)

## 2014-02-27 LAB — HEMOGLOBIN A1C: Hgb A1c MFr Bld: 6.7 % — ABNORMAL HIGH (ref 4.6–6.5)

## 2014-02-27 LAB — BASIC METABOLIC PANEL
BUN: 16 mg/dL (ref 6–23)
CO2: 30 meq/L (ref 19–32)
Calcium: 9.2 mg/dL (ref 8.4–10.5)
Chloride: 108 mEq/L (ref 96–112)
Creatinine, Ser: 0.9 mg/dL (ref 0.4–1.2)
GFR: 68.97 mL/min (ref >60.00)
Glucose, Bld: 113 mg/dL — ABNORMAL HIGH (ref 70–99)
Potassium: 4.3 mEq/L (ref 3.5–5.1)
SODIUM: 143 meq/L (ref 135–145)

## 2014-02-27 LAB — TSH: TSH: 1.61 u[IU]/mL (ref 0.35–4.50)

## 2014-02-27 LAB — HEPATIC FUNCTION PANEL
ALBUMIN: 3.6 g/dL (ref 3.5–5.2)
ALT: 37 U/L — ABNORMAL HIGH (ref 0–35)
AST: 28 U/L (ref 0–37)
Alkaline Phosphatase: 86 U/L (ref 39–117)
Bilirubin, Direct: 0.1 mg/dL (ref 0.0–0.3)
Total Bilirubin: 0.4 mg/dL (ref 0.2–1.2)
Total Protein: 6.7 g/dL (ref 6.0–8.3)

## 2014-02-27 MED ORDER — OLMESARTAN MEDOXOMIL-HCTZ 20-12.5 MG PO TABS
ORAL_TABLET | ORAL | Status: DC
Start: 2014-02-27 — End: 2014-07-21

## 2014-02-27 NOTE — Progress Notes (Signed)
Pre visit review using our clinic review tool, if applicable. No additional management support is needed unless otherwise documented below in the visit note. 

## 2014-02-27 NOTE — Patient Instructions (Addendum)
Your next office appointment will be determined based upon review of your pending labs . Those instructions will be transmitted to you through My Chart  OR  by mail;whichever process is your choice to receive results & recommendations .Minimal Blood Pressure Goal= AVERAGE < 140/90;  Ideal is an AVERAGE < 135/85. This AVERAGE should be calculated from @ least 5-7 BP readings taken @ different times of day on different days of week. You should not respond to isolated BP readings , but rather the AVERAGE for that week .Please bring your  blood pressure cuff to office visits to verify that it is reliable.It  can also be checked against the blood pressure device at the pharmacy. Finger or wrist cuffs are not dependable; an arm cuff is. Cardiovascular exercise, this can be as simple a program as walking, is recommended 30-45 minutes 3-4 times per week. If you're not exercising you should take 6-8 weeks to build up to this level. Reflux of gastric acid may be asymptomatic as this may occur mainly during sleep.The triggers for reflux  include stress; the "aspirin family" ; alcohol; peppermint; and caffeine (coffee, tea, cola, and chocolate). The aspirin family would include aspirin and the nonsteroidal agents such as ibuprofen &  Naproxen. Tylenol would not cause reflux. If having symptoms ; food & drink should be avoided for @ least 2 hours before going to bed.

## 2014-02-27 NOTE — Assessment & Plan Note (Signed)
Vit D level.

## 2014-02-27 NOTE — Assessment & Plan Note (Signed)
CBC & dif  Anti reflux measures 

## 2014-02-27 NOTE — Assessment & Plan Note (Signed)
Blood pressure goals reviewed. BMET 

## 2014-02-27 NOTE — Assessment & Plan Note (Signed)
Lipids, LFTs, TSH  Request copy NMR Lipoprofile

## 2014-02-27 NOTE — Telephone Encounter (Signed)
Patient is asking that a referral be sent to Dr. Brenton Grills with [facility] for her to have Colonoscopy.  She states that she discussed with Dr. Linna Darner about wanting to have a physical before 10 years and since her last one was in July 2010 they are needing a referral before scheduling.  Fax#: [phone]

## 2014-02-27 NOTE — Progress Notes (Signed)
   Subjective:    Patient ID: Miranda Gonzales, female    DOB: [birth year], 58 y.o.   MRN: [id number]  HPI  She is here for refill of her blood pressure medications and update of her active health issues  She is not on her blood pressure at home. She does not restrict salt. She is not exercising  She does have occasional palpitations which he relates to ingestion of tea ;these are nonexertional in nature.  Family history is positive for hypertension in mother, father, and sister. Her mother had a stroke in her 79s  Father had myocardial infarction at 18  Her advanced cholesterol testing was performed in 2001; those results aren't not available in the chart  She has GERD which is essentially stable with no worrisome signs or symptoms. Her last colonoscopy 2010. She's considering asking her gastroenterologist if he will repeat the colonoscopy this year as her mother had rectal cancer.  Her bone density was performed last year. She is unsure of the results. Values in 2012 reveal significant osteopenia with a value of -2.2 at the spine. She believes she is on 1000 international units of vitamin D daily  FBS had been up to 111 as per records     Review of Systems  Specifically denied are significant dyspepsia, dysphagia, abdominal pain, melena, rectal bleeding, or unexplained weight loss  She has no chest pain, dyspnea, claudication, edema, or paroxysmal nocturnal dyspnea.     Objective:   Physical Exam Appears healthy and well-nourished & in no acute distress  No carotid bruits are present.No neck pain distention present at 10 - 15 degrees. Thyroid normal to palpation  Heart rhythm and rate are normal with no gallop or murmur  Chest is clear with no increased work of breathing  There is no evidence of aortic aneurysm or renal artery bruits  Abdomen soft with no organomegaly or masses. No HJR  No clubbing, cyanosis or edema present.  Pedal pulses are intact   No ischemic  skin changes are present . Fingernails healthy   Alert and oriented. Strength, tone, DTRs reflexes normal          Assessment & Plan:  See Current Assessment & Plan in Problem List under specific Diagnosis

## 2014-02-27 NOTE — Assessment & Plan Note (Signed)
Results of 2014 study requested Weight bearing exercises

## 2014-02-28 ENCOUNTER — Other Ambulatory Visit: Payer: Self-pay | Admitting: Internal Medicine

## 2014-02-28 DIAGNOSIS — E785 Hyperlipidemia, unspecified: Secondary | ICD-10-CM

## 2014-02-28 DIAGNOSIS — R7402 Elevation of levels of lactic acid dehydrogenase (LDH): Secondary | ICD-10-CM | POA: Insufficient documentation

## 2014-02-28 DIAGNOSIS — R740 Nonspecific elevation of levels of transaminase and lactic acid dehydrogenase [LDH]: Secondary | ICD-10-CM

## 2014-02-28 DIAGNOSIS — E119 Type 2 diabetes mellitus without complications: Secondary | ICD-10-CM | POA: Insufficient documentation

## 2014-02-28 DIAGNOSIS — Z800 Family history of malignant neoplasm of digestive organs: Secondary | ICD-10-CM

## 2014-02-28 DIAGNOSIS — Z1211 Encounter for screening for malignant neoplasm of colon: Secondary | ICD-10-CM

## 2014-03-04 LAB — VITAMIN D 1,25 DIHYDROXY
Vitamin D 1, 25 (OH)2 Total: 99 pg/mL — ABNORMAL HIGH (ref 18–72)
Vitamin D2 1, 25 (OH)2: <8 pg/mL
Vitamin D3 1, 25 (OH)2: 99 pg/mL

## 2014-03-09 ENCOUNTER — Encounter: Payer: Self-pay | Admitting: Internal Medicine

## 2014-03-11 ENCOUNTER — Telehealth: Payer: Self-pay | Admitting: Internal Medicine

## 2014-03-11 ENCOUNTER — Ambulatory Visit
Admission: RE | Admit: 2014-03-11 | Discharge: 2014-03-11 | Disposition: A | Discharge status: Home / Self Care | Payer: Federal, State, Local not specified - PPO | Source: Ambulatory Visit

## 2014-03-11 DIAGNOSIS — Z1231 Encounter for screening mammogram for malignant neoplasm of breast: Secondary | ICD-10-CM

## 2014-03-11 NOTE — Telephone Encounter (Signed)
Rec'd from [facility] forward 12 pages to Dr.Hopper

## 2014-03-16 ENCOUNTER — Encounter: Payer: Self-pay | Admitting: Internal Medicine

## 2014-03-22 ENCOUNTER — Other Ambulatory Visit: Payer: Self-pay | Admitting: Internal Medicine

## 2014-03-24 ENCOUNTER — Encounter: Payer: Self-pay | Admitting: Internal Medicine

## 2014-03-24 NOTE — Telephone Encounter (Signed)
OK , R X2

## 2014-03-24 NOTE — Telephone Encounter (Signed)
Last office visit 02/27/2014

## 2014-04-30 ENCOUNTER — Other Ambulatory Visit: Payer: Self-pay | Admitting: Gastroenterology

## 2014-06-19 ENCOUNTER — Other Ambulatory Visit: Payer: Self-pay | Admitting: Internal Medicine

## 2014-06-20 NOTE — Telephone Encounter (Signed)
02/27/14 last office visit

## 2014-06-20 NOTE — Telephone Encounter (Signed)
OK X 3 mo 

## 2014-07-21 ENCOUNTER — Encounter (HOSPITAL_COMMUNITY): Payer: Self-pay | Admitting: Pharmacy Technician

## 2014-07-28 ENCOUNTER — Encounter (HOSPITAL_COMMUNITY): Payer: Self-pay | Admitting: *Deleted

## 2014-08-11 ENCOUNTER — Other Ambulatory Visit: Payer: Self-pay | Admitting: Gastroenterology

## 2014-08-11 NOTE — Anesthesia Preprocedure Evaluation (Addendum)
Anesthesia Evaluation  Patient identified by MRN, date of birth, ID band Patient awake    Reviewed: Allergy & Precautions, H&P , NPO status , Patient's Chart, lab work & pertinent test results  Airway Mallampati: II TM Distance: >3 FB Neck ROM: Full    Dental no notable dental hx.    Pulmonary neg pulmonary ROS,  breath sounds clear to auscultation  Pulmonary exam normal       Cardiovascular Exercise Tolerance: Good hypertension, Pt. on medications Rhythm:Regular Rate:Normal     Neuro/Psych negative neurological ROS  negative psych ROS   GI/Hepatic Neg liver ROS, GERD-  Medicated and Controlled,  Endo/Other  diabetes  Renal/GU negative Renal ROS  negative genitourinary   Musculoskeletal negative musculoskeletal ROS (+)   Abdominal   Peds negative pediatric ROS (+)  Hematology negative hematology ROS (+)   Anesthesia Other Findings   Reproductive/Obstetrics negative OB ROS                          Anesthesia Physical Anesthesia Plan  ASA: II  Anesthesia Plan: MAC   Post-op Pain Management:    Induction: Intravenous  Airway Management Planned: Nasal Cannula  Additional Equipment:   Intra-op Plan:   Post-operative Plan: Extubation in OR  Informed Consent: I have reviewed the patients History and Physical, chart, labs and discussed the procedure including the risks, benefits and alternatives for the proposed anesthesia with the patient or authorized representative who has indicated his/her understanding and acceptance.   Dental advisory given  Plan Discussed with: CRNA  Anesthesia Plan Comments:         Anesthesia Quick Evaluation

## 2014-08-12 ENCOUNTER — Ambulatory Visit (HOSPITAL_COMMUNITY)
Admission: RE | Admit: 2014-08-12 | Discharge: 2014-08-12 | Disposition: A | Discharge status: Home / Self Care | Payer: Federal, State, Local not specified - PPO | Source: Ambulatory Visit | Attending: Gastroenterology | Admitting: Gastroenterology

## 2014-08-12 ENCOUNTER — Encounter (HOSPITAL_COMMUNITY): Payer: Federal, State, Local not specified - PPO | Admitting: Anesthesiology

## 2014-08-12 ENCOUNTER — Encounter (HOSPITAL_COMMUNITY): Payer: Self-pay | Admitting: *Deleted

## 2014-08-12 ENCOUNTER — Encounter (HOSPITAL_COMMUNITY)
Admission: RE | Disposition: A | Discharge status: Home / Self Care | Payer: Self-pay | Source: Ambulatory Visit | Attending: Gastroenterology

## 2014-08-12 ENCOUNTER — Ambulatory Visit (HOSPITAL_COMMUNITY): Payer: Federal, State, Local not specified - PPO | Admitting: Anesthesiology

## 2014-08-12 DIAGNOSIS — Z800 Family history of malignant neoplasm of digestive organs: Secondary | ICD-10-CM | POA: Insufficient documentation

## 2014-08-12 DIAGNOSIS — Z1211 Encounter for screening for malignant neoplasm of colon: Secondary | ICD-10-CM | POA: Insufficient documentation

## 2014-08-12 DIAGNOSIS — Z86010 Personal history of colonic polyps: Secondary | ICD-10-CM | POA: Insufficient documentation

## 2014-08-12 DIAGNOSIS — I10 Essential (primary) hypertension: Secondary | ICD-10-CM | POA: Insufficient documentation

## 2014-08-12 HISTORY — PX: COLONOSCOPY WITH PROPOFOL: SHX5780

## 2014-08-12 SURGERY — COLONOSCOPY WITH PROPOFOL
Anesthesia: Monitor Anesthesia Care

## 2014-08-12 MED ORDER — PROPOFOL 10 MG/ML IV BOLUS
INTRAVENOUS | Status: AC
  Filled 2014-08-12: qty 20

## 2014-08-12 MED ORDER — LACTATED RINGERS IV SOLN
INTRAVENOUS | Status: DC | PRN
  Administered 2014-08-12: 11:00:00 via INTRAVENOUS

## 2014-08-12 MED ORDER — PROPOFOL 10 MG/ML IV EMUL
INTRAVENOUS | Status: DC | PRN
  Administered 2014-08-12: 60 mg via INTRAVENOUS

## 2014-08-12 MED ORDER — PROPOFOL INFUSION 10 MG/ML OPTIME
INTRAVENOUS | Status: DC | PRN
  Administered 2014-08-12: 140 ug/kg/min via INTRAVENOUS

## 2014-08-12 MED ORDER — LACTATED RINGERS IV SOLN
INTRAVENOUS | Status: DC
  Administered 2014-08-12: 1000 mL via INTRAVENOUS

## 2014-08-12 MED ORDER — LIDOCAINE HCL (CARDIAC) 20 MG/ML IV SOLN
INTRAVENOUS | Status: AC
  Filled 2014-08-12: qty 5

## 2014-08-12 SURGICAL SUPPLY — 22 items

## 2014-08-12 NOTE — H&P (Signed)
  Procedure: Surveillance colonoscopy. Colonoscopy with removal of a 5 mm ascending colon adenomatous polyp performed on 04/28/2009  History: The patient is a 58 year old female born [birth year]. Her mother was diagnosed with rectal cancer. She is scheduled to undergo a surveillance colonoscopy with polypectomy to prevent colon cancer.   Medication allergies: Penicillin  Past medical history: Hypertension. Bilateral tubal ligation.  Family history: Mother diagnosed with rectal cancer  Exam: The patient is alert and lying comfortably on the endoscopy stretcher. Abdomen is soft and nontender to palpation. Lungs are clear to auscultation. Cardiac exam reveals a regular rhythm.  Plan: Proceed with surveillance colonoscopy

## 2014-08-12 NOTE — Anesthesia Postprocedure Evaluation (Signed)
  Anesthesia Post-op Note  Patient: Miranda Gonzales  Procedure(s) Performed: Procedure(s) (LRB): COLONOSCOPY WITH PROPOFOL (N/A)  Patient Location: PACU  Anesthesia Type: MAC  Level of Consciousness: awake and alert   Airway and Oxygen Therapy: Patient Spontanous Breathing  Post-op Pain: mild  Post-op Assessment: Post-op Vital signs reviewed, Patient's Cardiovascular Status Stable, Respiratory Function Stable, Patent Airway and No signs of Nausea or vomiting  Last Vitals:  Filed Vitals:   08/12/14 1235  BP:   Pulse: 74  Temp:   Resp: 30    Post-op Vital Signs: stable   Complications: No apparent anesthesia complications

## 2014-08-12 NOTE — Discharge Instructions (Signed)
Colonoscopy, Care After °These instructions give you information on caring for yourself after your procedure. Your doctor may also give you more specific instructions. Call your doctor if you have any problems or questions after your procedure. °HOME CARE °· Do not drive for 24 hours. °· Do not sign important papers or use machinery for 24 hours. °· You may shower. °· You may go back to your usual activities, but go slower for the first 24 hours. °· Take rest breaks often during the first 24 hours. °· Walk around or use warm packs on your belly (abdomen) if you have belly cramping or gas. °· Drink enough fluids to keep your pee (urine) clear or pale yellow. °· Resume your normal diet. Avoid heavy or fried foods. °· Avoid drinking alcohol for 24 hours or as told by your doctor. °· Only take medicines as told by your doctor. °If a tissue sample (biopsy) was taken during the procedure:  °· Do not take aspirin or blood thinners for 7 days, or as told by your doctor. °· Do not drink alcohol for 7 days, or as told by your doctor. °· Eat soft foods for the first 24 hours. °GET HELP IF: °You still have a small amount of blood in your poop (stool) 2-3 days after the procedure. °GET HELP RIGHT AWAY IF: °· You have more than a small amount of blood in your poop. °· You see clumps of tissue (blood clots) in your poop. °· Your belly is puffy (swollen). °· You feel sick to your stomach (nauseous) or throw up (vomit). °· You have a fever. °· You have belly pain that gets worse and medicine does not help. °MAKE SURE YOU: °· Understand these instructions. °· Will watch your condition. °· Will get help right away if you are not doing well or get worse. °Document Released: 11/12/2010 Document Revised: 10/15/2013 Document Reviewed: 06/17/2013 °Exit[employer]® Patient Information ©2015 [employer]. This information is not intended to replace advice given to you by your health care provider. Make sure you discuss any questions you have with  your health care provider. ° °

## 2014-08-12 NOTE — Transfer of Care (Signed)
Immediate Anesthesia Transfer of Care Note  Patient: Miranda Gonzales  Procedure(s) Performed: Procedure(s): COLONOSCOPY WITH PROPOFOL (N/A)  Patient Location: PACU  Anesthesia Type:MAC  Level of Consciousness: awake, alert  and oriented  Airway & Oxygen Therapy: Patient Spontanous Breathing and Patient connected to face mask oxygen  Post-op Assessment: Report given to PACU RN and Post -op Vital signs reviewed and stable  Post vital signs: Reviewed and stable  Complications: No apparent anesthesia complications

## 2014-08-12 NOTE — Op Note (Signed)
Procedure: Surveillance colonoscopy. Colonoscopy with removal of a 5 mm ascending colon adenomatous colon polyp performed on 04/28/2009. Mother diagnosed with rectal cancer  Endoscopist: Earle Gell   Premedication: Propofol administered by anesthesia  Procedure: The patient was placed in the left lateral decubitus position. Anal inspection and digital rectal exam were normal. The Pentax pediatric colonoscope was introduced into the rectum and easily advanced to the cecum. A normal-appearing appendiceal orifice was identified. A normal-appearing ileocecal valve was identified. Colonic preparation for the exam today was good  Rectum. Normal. Retroflex view of the distal rectum normal  Sigmoid colon and descending colon. Normal  Splenic flexure. Normal  Transverse colon. Normal  Hepatic flexure. Normal  Ascending colon. Normal  Cecum and ileocecal valve. Normal  Assessment: Normal surveillance colonoscopy  Recommendation: Schedule surveillance colonoscopy in 5 years

## 2014-08-13 ENCOUNTER — Encounter (HOSPITAL_COMMUNITY): Payer: Self-pay | Admitting: Gastroenterology

## 2014-09-17 ENCOUNTER — Other Ambulatory Visit: Payer: Self-pay

## 2014-09-17 MED ORDER — ESCITALOPRAM OXALATE 10 MG PO TABS: 10.0000 mg | ORAL_TABLET | ORAL | Status: DC

## 2014-09-17 NOTE — Telephone Encounter (Signed)
OK X 3 mos 

## 2014-12-16 ENCOUNTER — Other Ambulatory Visit: Payer: Self-pay | Admitting: Internal Medicine

## 2014-12-17 ENCOUNTER — Other Ambulatory Visit: Payer: Self-pay | Admitting: Internal Medicine

## 2014-12-17 NOTE — Telephone Encounter (Signed)
Med last filled 09/17/14 #30 plus 2 refills  Last seen 02/27/14

## 2014-12-17 NOTE — Telephone Encounter (Signed)
OK X 1 but due for F/U OV

## 2015-01-16 ENCOUNTER — Other Ambulatory Visit: Payer: Self-pay | Admitting: Internal Medicine

## 2015-01-19 ENCOUNTER — Other Ambulatory Visit: Payer: Self-pay

## 2015-01-19 NOTE — Telephone Encounter (Signed)
Last seen 5/15  This is last refill

## 2015-01-19 NOTE — Telephone Encounter (Signed)
OK #30 Needs OV PTR ; last seen 5/15

## 2015-01-30 ENCOUNTER — Other Ambulatory Visit: Payer: Self-pay

## 2015-01-30 DIAGNOSIS — Z1231 Encounter for screening mammogram for malignant neoplasm of breast: Secondary | ICD-10-CM

## 2015-02-15 ENCOUNTER — Other Ambulatory Visit: Payer: Self-pay | Admitting: Internal Medicine

## 2015-02-16 NOTE — Telephone Encounter (Signed)
#  30 Last refiull w/o OV ; last seen 5/15

## 2015-03-13 ENCOUNTER — Ambulatory Visit
Admission: RE | Admit: 2015-03-13 | Discharge: 2015-03-13 | Disposition: A | Discharge status: Home / Self Care | Payer: Federal, State, Local not specified - PPO | Source: Ambulatory Visit

## 2015-03-13 DIAGNOSIS — Z1231 Encounter for screening mammogram for malignant neoplasm of breast: Secondary | ICD-10-CM

## 2015-03-16 ENCOUNTER — Telehealth: Payer: Self-pay | Admitting: Internal Medicine

## 2015-03-19 NOTE — Telephone Encounter (Signed)
I scheduled patient for 6/1, but she will run out of medication that was requested before then. Can you please call in enough for her. Pharmacy is [facility] on [address]

## 2015-03-20 ENCOUNTER — Other Ambulatory Visit: Payer: Self-pay

## 2015-03-20 ENCOUNTER — Other Ambulatory Visit: Payer: Self-pay | Admitting: Internal Medicine

## 2015-03-20 MED ORDER — OLMESARTAN MEDOXOMIL-HCTZ 20-12.5 MG PO TABS: 1.0000 | ORAL_TABLET | ORAL | Status: DC

## 2015-03-20 MED ORDER — ESCITALOPRAM OXALATE 10 MG PO TABS: ORAL_TABLET | ORAL | Status: DC

## 2015-03-20 NOTE — Telephone Encounter (Signed)
benicar and lexapro rx called in to pharm

## 2015-03-20 NOTE — Telephone Encounter (Signed)
benecar rx sent to pharm

## 2015-03-20 NOTE — Telephone Encounter (Signed)
Please advise, thanks.

## 2015-03-20 NOTE — Telephone Encounter (Signed)
OK #30 of each

## 2015-03-25 ENCOUNTER — Other Ambulatory Visit: Payer: Self-pay | Admitting: Internal Medicine

## 2015-03-25 ENCOUNTER — Other Ambulatory Visit (INDEPENDENT_AMBULATORY_CARE_PROVIDER_SITE_OTHER): Payer: Federal, State, Local not specified - PPO

## 2015-03-25 ENCOUNTER — Encounter: Payer: Self-pay | Admitting: Internal Medicine

## 2015-03-25 ENCOUNTER — Ambulatory Visit (INDEPENDENT_AMBULATORY_CARE_PROVIDER_SITE_OTHER): Payer: Federal, State, Local not specified - PPO | Admitting: Internal Medicine

## 2015-03-25 VITALS — BP 118/72 | HR 78 | Temp 97.6°F | Wt 192.1 lb

## 2015-03-25 DIAGNOSIS — E785 Hyperlipidemia, unspecified: Secondary | ICD-10-CM | POA: Diagnosis not present

## 2015-03-25 DIAGNOSIS — F411 Generalized anxiety disorder: Secondary | ICD-10-CM | POA: Diagnosis not present

## 2015-03-25 DIAGNOSIS — E119 Type 2 diabetes mellitus without complications: Secondary | ICD-10-CM

## 2015-03-25 DIAGNOSIS — E559 Vitamin D deficiency, unspecified: Secondary | ICD-10-CM

## 2015-03-25 DIAGNOSIS — I10 Essential (primary) hypertension: Secondary | ICD-10-CM | POA: Diagnosis not present

## 2015-03-25 LAB — HEPATIC FUNCTION PANEL
ALT: 37 U/L — ABNORMAL HIGH (ref 0–35)
AST: 23 U/L (ref 0–37)
Albumin: 4 g/dL (ref 3.5–5.2)
Alkaline Phosphatase: 108 U/L (ref 39–117)
BILIRUBIN TOTAL: 0.4 mg/dL (ref 0.2–1.2)
Bilirubin, Direct: 0 mg/dL (ref 0.0–0.3)
TOTAL PROTEIN: 7.2 g/dL (ref 6.0–8.3)

## 2015-03-25 LAB — BASIC METABOLIC PANEL
BUN: 16 mg/dL (ref 6–23)
CALCIUM: 9.5 mg/dL (ref 8.4–10.5)
CO2: 31 mEq/L (ref 19–32)
Chloride: 104 mEq/L (ref 96–112)
Creatinine, Ser: 0.85 mg/dL (ref 0.40–1.20)
GFR: 72.46 mL/min (ref >60.00)
Glucose, Bld: 121 mg/dL — ABNORMAL HIGH (ref 70–99)
POTASSIUM: 4.8 meq/L (ref 3.5–5.1)
Sodium: 140 mEq/L (ref 135–145)

## 2015-03-25 LAB — TSH: TSH: 2.05 u[IU]/mL (ref 0.35–4.50)

## 2015-03-25 LAB — MICROALBUMIN / CREATININE URINE RATIO
Creatinine,U: 39.5 mg/dL
MICROALB/CREAT RATIO: 1.8 mg/g (ref 0.0–30.0)
Microalb, Ur: <0.7 mg/dL (ref 0.0–1.9)

## 2015-03-25 LAB — VITAMIN D 25 HYDROXY (VIT D DEFICIENCY, FRACTURES): VITD: 17.98 ng/mL — ABNORMAL LOW (ref 30.00–100.00)

## 2015-03-25 LAB — HEMOGLOBIN A1C: HEMOGLOBIN A1C: 6.4 % (ref 4.6–6.5)

## 2015-03-25 NOTE — Assessment & Plan Note (Signed)
Blood pressure goals reviewed. BMET 

## 2015-03-25 NOTE — Patient Instructions (Signed)
  Your next office appointment will be determined based upon review of your pending labs  and  xrays  Those written interpretation of the lab results and instructions will be transmitted to you by mail for your records.  Critical results will be called.   Followup as needed for any active or acute issue. Please report any significant change in your symptoms.  Minimal Blood Pressure Goal= AVERAGE < 140/90;  Ideal is an AVERAGE < 135/85. This AVERAGE should be calculated from @ least 5-7 BP readings taken @ different times of day on different days of week. You should not respond to isolated BP readings , but rather the AVERAGE for that week .Please bring your  blood pressure cuff to office visits to verify that it is reliable.It  can also be checked against the blood pressure device at the pharmacy. Finger or wrist cuffs are not dependable; an arm cuff is.

## 2015-03-25 NOTE — Assessment & Plan Note (Signed)
NMR LipoProfile, hepatic function, TSH

## 2015-03-25 NOTE — Assessment & Plan Note (Signed)
A1c , urine microalbumin, BMET 

## 2015-03-25 NOTE — Assessment & Plan Note (Signed)
   Renew generic Lexapro as it is a low dose and she has excellent response with no adverse effects

## 2015-03-25 NOTE — Progress Notes (Signed)
   Subjective:    Patient ID: Miranda Gonzales, female    DOB: [birth year], 59 y.o.   MRN: [id number]  HPI  The patient is here to assess status of active health conditions.  PMH, FH, & Social History reviewed & updated.  She has been compliant with medications without adverse effects. He does eat red meat; she does not restrict salt. She does not exercise.  She's never smoked and does not drink.  Family history includes myocardial infarction her father @ 62 & 20. Mother had stroke in her 32s. Her mother and sister had hypertension. Her mother also had diabetes.  Colonoscopy was performed 2015 and was negative.She had a polyp in 2010.Father had polyps and mother rectal cancer.  She is on 1000 international units vitamin D daily.   She's been compliant with the generic low-dose Lexapro. She finds that keeps her calm and helps her handle stresses at work.  She has no active cardiovascular, psychiatric, GI, or diabetic related symptoms.   Review of Systems  Chest pain, palpitations, tachycardia, exertional dyspnea, paroxysmal nocturnal dyspnea, claudication or edema are absent. No unexplained weight loss, abdominal pain, significant dyspepsia, dysphagia, melena, rectal bleeding, or persistently small caliber stools. Dysuria, pyuria, hematuria, frequency, nocturia or polyuria are denied. Change in hair, skin, nails denied. No bowel changes of constipation or diarrhea. No intolerance to heat or cold.    Objective:   Physical Exam   Pertinent or positive findings include: Wax present in the right canal partially obscuring the TM. Abdomen is protuberant. Crepitus of knees, left greater than right.  General appearance :adequately nourished; in no distress. Eyes: No conjunctival inflammation or scleral icterus is present. Oral exam:  Lips and gums are healthy appearing.There is no oropharyngeal erythema or exudate noted. Dental hygiene is good. Heart:  Normal rate and regular rhythm. S1  and S2 normal without gallop, murmur, click, rub or other extra sounds   Lungs:Chest clear to auscultation; no wheezes, rhonchi,rales ,or rubs present.No increased work of breathing.  Abdomen: bowel sounds normal, soft and non-tender without masses, organomegaly or hernias noted.  No guarding or rebound.  Vascular : all pulses equal ; no bruits present. Skin:Warm & dry.  Intact without suspicious lesions or rashes ; no tenting or jaundice  Lymphatic: No lymphadenopathy is noted about the head, neck, axilla Neuro: Strength, tone & DTRs normal.        Assessment & Plan:  See Current Assessment & Plan in Problem List under specific Diagnosis

## 2015-03-27 LAB — NMR LIPOPROFILE WITH LIPIDS
CHOLESTEROL, TOTAL: 201 mg/dL — AB (ref 100–199)
HDL Particle Number: 29.4 umol/L — ABNORMAL LOW (ref >=30.5)
HDL Size: 8.7 nm — ABNORMAL LOW (ref >=9.2)
HDL-C: 50 mg/dL (ref >39)
LARGE HDL: 2.1 umol/L — AB (ref >=4.8)
LARGE VLDL-P: 3.8 nmol/L — AB (ref <=2.7)
LDL (calc): 130 mg/dL — ABNORMAL HIGH (ref 0–99)
LDL Particle Number: 1613 nmol/L — ABNORMAL HIGH (ref <1000)
LDL SIZE: 21.8 nm (ref >=20.8)
LP-IR Score: 61 — ABNORMAL HIGH (ref <=45)
SMALL LDL PARTICLE NUMBER: 368 nmol/L (ref <=527)
Triglycerides: 106 mg/dL (ref 0–149)
VLDL SIZE: 47.3 nm — AB (ref <=46.6)

## 2015-03-30 ENCOUNTER — Telehealth: Payer: Self-pay | Admitting: Internal Medicine

## 2015-03-30 ENCOUNTER — Other Ambulatory Visit: Payer: Self-pay | Admitting: Internal Medicine

## 2015-03-30 DIAGNOSIS — E785 Hyperlipidemia, unspecified: Secondary | ICD-10-CM

## 2015-03-30 MED ORDER — SIMVASTATIN 20 MG PO TABS: 20.0000 mg | ORAL_TABLET | Freq: Every day | ORAL | Status: DC

## 2015-03-30 NOTE — Telephone Encounter (Signed)
Patient aware and will come in around the beginning of August for fasting labs.

## 2015-03-30 NOTE — Telephone Encounter (Signed)
Rx sent Fasting labs in 10 weeks

## 2015-03-30 NOTE — Telephone Encounter (Signed)
Patient would like to start a statin for cholesterol. Please advise, thanks.

## 2015-03-30 NOTE — Telephone Encounter (Signed)
Patient states she just had a vm stating recent labs and asking if she would like cholesterol meds sent to pharmacy.  Patient would like something sent to her pharmacy.  [facility] [address].

## 2015-04-16 ENCOUNTER — Other Ambulatory Visit: Payer: Self-pay | Admitting: Internal Medicine

## 2015-04-17 ENCOUNTER — Other Ambulatory Visit: Payer: Self-pay | Admitting: Emergency Medicine

## 2015-04-17 MED ORDER — ESCITALOPRAM OXALATE 10 MG PO TABS: ORAL_TABLET | ORAL | Status: DC

## 2015-06-08 ENCOUNTER — Other Ambulatory Visit (INDEPENDENT_AMBULATORY_CARE_PROVIDER_SITE_OTHER): Payer: Federal, State, Local not specified - PPO

## 2015-06-08 DIAGNOSIS — E785 Hyperlipidemia, unspecified: Secondary | ICD-10-CM | POA: Diagnosis not present

## 2015-06-08 LAB — HEPATIC FUNCTION PANEL
ALBUMIN: 3.8 g/dL (ref 3.5–5.2)
ALK PHOS: 87 U/L (ref 39–117)
ALT: 41 U/L — ABNORMAL HIGH (ref 0–35)
AST: 23 U/L (ref 0–37)
Bilirubin, Direct: 0.1 mg/dL (ref 0.0–0.3)
Total Bilirubin: 0.3 mg/dL (ref 0.2–1.2)
Total Protein: 6.8 g/dL (ref 6.0–8.3)

## 2015-06-08 LAB — LIPID PANEL
Cholesterol: 139 mg/dL (ref 0–200)
HDL: 37.8 mg/dL — ABNORMAL LOW (ref >39.00)
LDL CALC: 77 mg/dL (ref 0–99)
NONHDL: 101.17
Total CHOL/HDL Ratio: 4
Triglycerides: 121 mg/dL (ref 0.0–149.0)
VLDL: 24.2 mg/dL (ref 0.0–40.0)

## 2015-06-08 LAB — CK: CK TOTAL: 50 U/L (ref 7–177)

## 2016-01-13 ENCOUNTER — Telehealth: Payer: Self-pay | Admitting: Behavioral Health

## 2016-01-13 NOTE — Telephone Encounter (Signed)
Unable to reach patient at time of Pre-Visit Call.  Left message for patient to return call when available.    

## 2016-01-14 ENCOUNTER — Encounter: Payer: Self-pay | Admitting: Family Medicine

## 2016-01-14 ENCOUNTER — Ambulatory Visit (INDEPENDENT_AMBULATORY_CARE_PROVIDER_SITE_OTHER): Payer: Federal, State, Local not specified - PPO | Admitting: Family Medicine

## 2016-01-14 VITALS — BP 99/65 | HR 77 | Temp 97.9°F | Ht 62.0 in | Wt 182.8 lb

## 2016-01-14 DIAGNOSIS — I10 Essential (primary) hypertension: Secondary | ICD-10-CM | POA: Diagnosis not present

## 2016-01-14 DIAGNOSIS — R739 Hyperglycemia, unspecified: Secondary | ICD-10-CM

## 2016-01-14 DIAGNOSIS — Z1159 Encounter for screening for other viral diseases: Secondary | ICD-10-CM

## 2016-01-14 DIAGNOSIS — E785 Hyperlipidemia, unspecified: Secondary | ICD-10-CM | POA: Diagnosis not present

## 2016-01-14 DIAGNOSIS — Z114 Encounter for screening for human immunodeficiency virus [HIV]: Secondary | ICD-10-CM

## 2016-01-14 LAB — HIV ANTIBODY (ROUTINE TESTING W REFLEX): HIV: NONREACTIVE

## 2016-01-14 MED ORDER — OLMESARTAN MEDOXOMIL 20 MG PO TABS
20.0000 mg | ORAL_TABLET | Freq: Every day | ORAL | Status: DC
Start: 2016-01-14 — End: 2016-01-17

## 2016-01-14 NOTE — Progress Notes (Signed)
Pre visit review using our clinic review tool, if applicable. No additional management support is needed unless otherwise documented below in the visit note. 

## 2016-01-14 NOTE — Patient Instructions (Signed)

## 2016-01-15 LAB — LIPID PANEL
CHOL/HDL RATIO: 4
CHOLESTEROL: 124 mg/dL (ref 0–200)
HDL: 35.4 mg/dL — ABNORMAL LOW (ref >39.00)
LDL Cholesterol: 72 mg/dL (ref 0–99)
NonHDL: 88.59
TRIGLYCERIDES: 83 mg/dL (ref 0.0–149.0)
VLDL: 16.6 mg/dL (ref 0.0–40.0)

## 2016-01-15 LAB — COMPREHENSIVE METABOLIC PANEL
ALBUMIN: 4.3 g/dL (ref 3.5–5.2)
ALK PHOS: 77 U/L (ref 39–117)
ALT: 25 U/L (ref 0–35)
AST: 18 U/L (ref 0–37)
BILIRUBIN TOTAL: 0.4 mg/dL (ref 0.2–1.2)
BUN: 24 mg/dL — ABNORMAL HIGH (ref 6–23)
CALCIUM: 10 mg/dL (ref 8.4–10.5)
CO2: 32 mEq/L (ref 19–32)
CREATININE: 0.81 mg/dL (ref 0.40–1.20)
Chloride: 101 mEq/L (ref 96–112)
GFR: 76.4 mL/min (ref >60.00)
Glucose, Bld: 82 mg/dL (ref 70–99)
Potassium: 3.9 mEq/L (ref 3.5–5.1)
Sodium: 140 mEq/L (ref 135–145)
TOTAL PROTEIN: 7.3 g/dL (ref 6.0–8.3)

## 2016-01-15 LAB — HEMOGLOBIN A1C: Hgb A1c MFr Bld: 6.5 % (ref 4.6–6.5)

## 2016-01-15 LAB — HEPATITIS C ANTIBODY: HCV AB: NEGATIVE

## 2016-01-17 MED ORDER — OLMESARTAN MEDOXOMIL 20 MG PO TABS: 20.0000 mg | ORAL_TABLET | Freq: Every day | ORAL | Status: DC

## 2016-01-17 MED ORDER — SIMVASTATIN 20 MG PO TABS: 20.0000 mg | ORAL_TABLET | Freq: Every day | ORAL | Status: DC

## 2016-01-17 NOTE — Progress Notes (Signed)
Patient ID: Miranda Gonzales, female    DOB: Aug 11, 1956  Age: 60 y.o. MRN: [id number]    Subjective:  Subjective HPI TAKAI CAUL presents to establish care and f/u bp.   No complaint.s    Review of Systems  Constitutional: Negative for diaphoresis, appetite change, fatigue and unexpected weight change.  Eyes: Negative for pain, redness and visual disturbance.  Respiratory: Negative for cough, chest tightness, shortness of breath and wheezing.   Cardiovascular: Negative for chest pain, palpitations and leg swelling.  Endocrine: Negative for cold intolerance, heat intolerance, polydipsia, polyphagia and polyuria.  Genitourinary: Negative for dysuria, frequency and difficulty urinating.  Neurological: Negative for dizziness, light-headedness, numbness and headaches.    History Past Medical History  Diagnosis Date  . Hyperlipidemia   . Hypertension   . GERD (gastroesophageal reflux disease)   . Anxiety     She has past surgical history that includes Colonoscopy w/ polypectomy (04/2009); Tubal ligation; and Colonoscopy with propofol (N/A, 08/12/2014).   Her family history includes Cancer in her mother; Colon polyps in her father; Diabetes in her mother; Heart attack in her father; Hyperlipidemia in her sister; Hypertension in her mother and sister; Stroke in her mother.She reports that she has never smoked. She has never used smokeless tobacco. She reports that she does not drink alcohol or use illicit drugs.  Current Outpatient Prescriptions on File Prior to Visit  Medication Sig Dispense Refill  . escitalopram (LEXAPRO) 10 MG tablet Take 1 tablet (10 mg total) by mouth every morning. 90 tablet 3  . ibuprofen (ADVIL,MOTRIN) 200 MG tablet Take 800 mg by mouth once as needed for headache or mild pain.    . mometasone (ELOCON) 0.1 % ointment   2   No current facility-administered medications on file prior to visit.     Objective:  Objective Physical Exam  Constitutional: She is  oriented to person, place, and time. She appears well-developed and well-nourished.  HENT:  Head: Normocephalic and atraumatic.  Eyes: Conjunctivae and EOM are normal.  Neck: Normal range of motion. Neck supple. No JVD present. Carotid bruit is not present. No thyromegaly present.  Cardiovascular: Normal rate, regular rhythm and normal heart sounds.   No murmur heard. Pulmonary/Chest: Effort normal and breath sounds normal. No respiratory distress. She has no wheezes. She has no rales. She exhibits no tenderness.  Musculoskeletal: She exhibits no edema.  Neurological: She is alert and oriented to person, place, and time.  Psychiatric: She has a normal mood and affect.   BP 99/65 mmHg  Pulse 77  Temp(Src) 97.9 F (36.6 C) (Oral)  Ht 5\' 2"  (1.575 m)  Wt 182 lb 12.8 oz (82.918 kg)  BMI 33.43 kg/m2  SpO2 99% Wt Readings from Last 3 Encounters:  01/14/16 182 lb 12.8 oz (82.918 kg)  03/25/15 192 lb 1.3 oz (87.127 kg)  08/12/14 179 lb (81.194 kg)     Lab Results  Component Value Date   WBC 7.5 02/27/2014   HGB 14.5 02/27/2014   HCT 44.3 02/27/2014   PLT 310.0 02/27/2014   GLUCOSE 82 01/14/2016   CHOL 124 01/14/2016   TRIG 83.0 01/14/2016   HDL 35.40* 01/14/2016   LDLCALC 72 01/14/2016   ALT 25 01/14/2016   AST 18 01/14/2016   NA 140 01/14/2016   K 3.9 01/14/2016   CL 101 01/14/2016   CREATININE 0.81 01/14/2016   BUN 24* 01/14/2016   CO2 32 01/14/2016   TSH 2.05 03/25/2015   HGBA1C 6.5 01/14/2016  MICROALBUR <0.7 03/25/2015    Mm Screening Breast Tomo Bilateral  03/13/2015  CLINICAL DATA:  Screening. EXAM: DIGITAL SCREENING BILATERAL MAMMOGRAM WITH 3D TOMO WITH CAD COMPARISON:  Previous exam(s). ACR Breast Density Category b: There are scattered areas of fibroglandular density. FINDINGS: There are no findings suspicious for malignancy. Images were processed with CAD. IMPRESSION: No mammographic evidence of malignancy. A result letter of this screening mammogram will be  mailed directly to the patient. RECOMMENDATION: Screening mammogram in one year. (Code:SM-B-01Y) BI-RADS CATEGORY  1: Negative. Electronically Signed   By: Conchita Paris M.D.   On: 03/13/2015 08:53     Assessment & Plan:  Plan I have discontinued Ms. Kirtley's BENICAR HCT and nystatin. I have also changed her simvastatin. Additionally, I am having her maintain her ibuprofen, mometasone, escitalopram, and olmesartan.  Meds ordered this encounter  Medications  . DISCONTD: olmesartan (BENICAR) 20 MG tablet    Sig: Take 1 tablet (20 mg total) by mouth daily.    Dispense:  90 tablet    Refill:  1  . simvastatin (ZOCOR) 20 MG tablet    Sig: Take 1 tablet (20 mg total) by mouth at bedtime.    Dispense:  90 tablet    Refill:  3  . olmesartan (BENICAR) 20 MG tablet    Sig: Take 1 tablet (20 mg total) by mouth daily.    Dispense:  90 tablet    Refill:  1    Problem List Items Addressed This Visit      Unprioritized   Essential hypertension - Primary    Stable con't meds      Relevant Medications   simvastatin (ZOCOR) 20 MG tablet   olmesartan (BENICAR) 20 MG tablet   Other Relevant Orders   Comprehensive metabolic panel (Completed)   Lipid panel (Completed)   Hyperlipidemia   Relevant Medications   simvastatin (ZOCOR) 20 MG tablet   olmesartan (BENICAR) 20 MG tablet   Other Relevant Orders   Comprehensive metabolic panel (Completed)   Lipid panel (Completed)    Other Visit Diagnoses    Hyperglycemia        Relevant Orders    Hemoglobin A1c (Completed)    Need for hepatitis C screening test        Relevant Orders    Hepatitis C Antibody (Completed)    Screening for HIV (human immunodeficiency virus)        Relevant Orders    HIV antibody (Completed)       Follow-up: Return in about 3 weeks (around 02/04/2016), or if symptoms worsen or fail to improve, for hypertension.  Garnet Koyanagi, DO

## 2016-01-17 NOTE — Assessment & Plan Note (Signed)
Stable con't meds 

## 2016-02-08 ENCOUNTER — Ambulatory Visit (INDEPENDENT_AMBULATORY_CARE_PROVIDER_SITE_OTHER): Payer: Federal, State, Local not specified - PPO | Admitting: Family Medicine

## 2016-02-08 ENCOUNTER — Encounter: Payer: Self-pay | Admitting: Family Medicine

## 2016-02-08 VITALS — BP 116/72 | HR 76 | Temp 98.1°F | Ht 62.0 in | Wt 181.0 lb

## 2016-02-08 DIAGNOSIS — R739 Hyperglycemia, unspecified: Secondary | ICD-10-CM

## 2016-02-08 DIAGNOSIS — I10 Essential (primary) hypertension: Secondary | ICD-10-CM | POA: Diagnosis not present

## 2016-02-08 MED ORDER — OLMESARTAN MEDOXOMIL 20 MG PO TABS: ORAL_TABLET | ORAL | Status: DC

## 2016-02-08 NOTE — Progress Notes (Signed)
Pre visit review using our clinic review tool, if applicable. No additional management support is needed unless otherwise documented below in the visit note. 

## 2016-02-08 NOTE — Patient Instructions (Signed)
Hypertension Hypertension, commonly called high blood pressure, is when the force of blood pumping through your arteries is too strong. Your arteries are the blood vessels that carry blood from your heart throughout your body. A blood pressure reading consists of a higher number over a lower number, such as 110/72. The higher number (systolic) is the pressure inside your arteries when your heart pumps. The lower number (diastolic) is the pressure inside your arteries when your heart relaxes. Ideally you want your blood pressure below 120/80. Hypertension forces your heart to work harder to pump blood. Your arteries may become narrow or stiff. Having untreated or uncontrolled hypertension can cause heart attack, stroke, kidney disease, and other problems. RISK FACTORS Some risk factors for high blood pressure are controllable. Others are not.  Risk factors you cannot control include:   Race. You may be at higher risk if you are African American.  Age. Risk increases with age.  Gender. Men are at higher risk than women before age 45 years. After age 65, women are at higher risk than men. Risk factors you can control include:  Not getting enough exercise or physical activity.  Being overweight.  Getting too much fat, sugar, calories, or salt in your diet.  Drinking too much alcohol. SIGNS AND SYMPTOMS Hypertension does not usually cause signs or symptoms. Extremely high blood pressure (hypertensive crisis) may cause headache, anxiety, shortness of breath, and nosebleed. DIAGNOSIS To check if you have hypertension, your health care provider will measure your blood pressure while you are seated, with your arm held at the level of your heart. It should be measured at least twice using the same arm. Certain conditions can cause a difference in blood pressure between your right and left arms. A blood pressure reading that is higher than normal on one occasion does not mean that you need treatment. If  it is not clear whether you have high blood pressure, you may be asked to return on a different day to have your blood pressure checked again. Or, you may be asked to monitor your blood pressure at home for 1 or more weeks. TREATMENT Treating high blood pressure includes making lifestyle changes and possibly taking medicine. Living a healthy lifestyle can help lower high blood pressure. You may need to change some of your habits. Lifestyle changes may include:  Following the DASH diet. This diet is high in fruits, vegetables, and whole grains. It is low in salt, red meat, and added sugars.  Keep your sodium intake below 2,300 mg per day.  Getting at least 30-45 minutes of aerobic exercise at least 4 times per week.  Losing weight if necessary.  Not smoking.  Limiting alcoholic beverages.  Learning ways to reduce stress. Your health care provider may prescribe medicine if lifestyle changes are not enough to get your blood pressure under control, and if one of the following is true:  You are 18-59 years of age and your systolic blood pressure is above 140.  You are 60 years of age or older, and your systolic blood pressure is above 150.  Your diastolic blood pressure is above 90.  You have diabetes, and your systolic blood pressure is over 140 or your diastolic blood pressure is over 90.  You have kidney disease and your blood pressure is above 140/90.  You have heart disease and your blood pressure is above 140/90. Your personal target blood pressure may vary depending on your medical conditions, your age, and other factors. HOME CARE INSTRUCTIONS    Have your blood pressure rechecked as directed by your health care provider.   Take medicines only as directed by your health care provider. Follow the directions carefully. Blood pressure medicines must be taken as prescribed. The medicine does not work as well when you skip doses. Skipping doses also puts you at risk for  problems.  Do not smoke.   Monitor your blood pressure at home as directed by your health care provider. SEEK MEDICAL CARE IF:   You think you are having a reaction to medicines taken.  You have recurrent headaches or feel dizzy.  You have swelling in your ankles.  You have trouble with your vision. SEEK IMMEDIATE MEDICAL CARE IF:  You develop a severe headache or confusion.  You have unusual weakness, numbness, or feel faint.  You have severe chest or abdominal pain.  You vomit repeatedly.  You have trouble breathing. MAKE SURE YOU:   Understand these instructions.  Will watch your condition.  Will get help right away if you are not doing well or get worse.   This information is not intended to replace advice given to you by your health care provider. Make sure you discuss any questions you have with your health care provider.   Document Released: 10/10/2005 Document Revised: 02/24/2015 Document Reviewed: 08/02/2013 Elsevier Interactive Patient Education 2016 Elsevier Inc.  

## 2016-02-08 NOTE — Progress Notes (Signed)
Patient ID: Miranda Gonzales, female    DOB: 02-Oct-1956  Age: 60 y.o. MRN: [id number]    Subjective:  Subjective HPI LOURINE ELLINGHAM presents for f/u bp and labs.  No complaints.  Home pressures have been low.    Review of Systems  Constitutional: Negative for diaphoresis, appetite change, fatigue and unexpected weight change.  Eyes: Negative for pain, redness and visual disturbance.  Respiratory: Negative for cough, chest tightness, shortness of breath and wheezing.   Cardiovascular: Negative for chest pain, palpitations and leg swelling.  Endocrine: Negative for cold intolerance, heat intolerance, polydipsia, polyphagia and polyuria.  Genitourinary: Negative for dysuria, frequency and difficulty urinating.  Neurological: Negative for dizziness, light-headedness, numbness and headaches.    History Past Medical History  Diagnosis Date  . Hyperlipidemia   . Hypertension   . GERD (gastroesophageal reflux disease)   . Anxiety     She has past surgical history that includes Colonoscopy w/ polypectomy (04/2009); Tubal ligation; and Colonoscopy with propofol (N/A, 08/12/2014).   Her family history includes Cancer in her mother; Colon polyps in her father; Diabetes in her mother; Heart attack in her father; Hyperlipidemia in her sister; Hypertension in her mother and sister; Stroke in her mother.She reports that she has never smoked. She has never used smokeless tobacco. She reports that she does not drink alcohol or use illicit drugs.  Current Outpatient Prescriptions on File Prior to Visit  Medication Sig Dispense Refill  . escitalopram (LEXAPRO) 10 MG tablet Take 1 tablet (10 mg total) by mouth every morning. 90 tablet 3  . ibuprofen (ADVIL,MOTRIN) 200 MG tablet Take 800 mg by mouth once as needed for headache or mild pain.    . mometasone (ELOCON) 0.1 % ointment   2  . simvastatin (ZOCOR) 20 MG tablet Take 1 tablet (20 mg total) by mouth at bedtime. 90 tablet 3   No current  facility-administered medications on file prior to visit.     Objective:  Objective Physical Exam  Constitutional: She is oriented to person, place, and time. She appears well-developed and well-nourished.  HENT:  Head: Normocephalic and atraumatic.  Eyes: Conjunctivae and EOM are normal.  Neck: Normal range of motion. Neck supple. No JVD present. Carotid bruit is not present. No thyromegaly present.  Cardiovascular: Normal rate, regular rhythm and normal heart sounds.   No murmur heard. Pulmonary/Chest: Effort normal and breath sounds normal. No respiratory distress. She has no wheezes. She has no rales. She exhibits no tenderness.  Musculoskeletal: She exhibits no edema.  Neurological: She is alert and oriented to person, place, and time.  Psychiatric: She has a normal mood and affect. Her behavior is normal.  Nursing note and vitals reviewed.  BP 116/72 mmHg  Pulse 76  Temp(Src) 98.1 F (36.7 C) (Oral)  Ht 5\' 2"  (1.575 m)  Wt 181 lb (82.101 kg)  BMI 33.10 kg/m2  SpO2 97% Wt Readings from Last 3 Encounters:  02/08/16 181 lb (82.101 kg)  01/14/16 182 lb 12.8 oz (82.918 kg)  03/25/15 192 lb 1.3 oz (87.127 kg)     Lab Results  Component Value Date   WBC 7.5 02/27/2014   HGB 14.5 02/27/2014   HCT 44.3 02/27/2014   PLT 310.0 02/27/2014   GLUCOSE 82 01/14/2016   CHOL 124 01/14/2016   TRIG 83.0 01/14/2016   HDL 35.40* 01/14/2016   LDLCALC 72 01/14/2016   ALT 25 01/14/2016   AST 18 01/14/2016   NA 140 01/14/2016   K 3.9 01/14/2016  CL 101 01/14/2016   CREATININE 0.81 01/14/2016   BUN 24* 01/14/2016   CO2 32 01/14/2016   TSH 2.05 03/25/2015   HGBA1C 6.5 01/14/2016   MICROALBUR <0.7 03/25/2015    Mm Screening Breast Tomo Bilateral  03/13/2015  CLINICAL DATA:  Screening. EXAM: DIGITAL SCREENING BILATERAL MAMMOGRAM WITH 3D TOMO WITH CAD COMPARISON:  Previous exam(s). ACR Breast Density Category b: There are scattered areas of fibroglandular density. FINDINGS: There  are no findings suspicious for malignancy. Images were processed with CAD. IMPRESSION: No mammographic evidence of malignancy. A result letter of this screening mammogram will be mailed directly to the patient. RECOMMENDATION: Screening mammogram in one year. (Code:SM-B-01Y) BI-RADS CATEGORY  1: Negative. Electronically Signed   By: Conchita Paris M.D.   On: 03/13/2015 08:53     Assessment & Plan:  Plan I have discontinued Ms. Crandle's olmesartan. I am also having her start on olmesartan. Additionally, I am having her maintain her ibuprofen, mometasone, escitalopram, and simvastatin.  Meds ordered this encounter  Medications  . olmesartan (BENICAR) 20 MG tablet    Sig: 1/2 tab po qd    Dispense:  45 tablet    Refill:  3    Problem List Items Addressed This Visit      Unprioritized   Essential hypertension - Primary    bp runnning low--- will lower benicar dose F/u 3 weeks       Relevant Medications   olmesartan (BENICAR) 20 MG tablet   Other Relevant Orders   Lipid panel   Hemoglobin A1c   Comprehensive metabolic panel    Other Visit Diagnoses    Hyperglycemia        Relevant Orders    Lipid panel    Hemoglobin A1c    Comprehensive metabolic panel       Follow-up: Return in about 3 weeks (around 02/29/2016), or if symptoms worsen or fail to improve, for hypertension.  Ann Held, DO

## 2016-02-08 NOTE — Assessment & Plan Note (Signed)
bp runnning low--- will lower benicar dose F/u 3 weeks

## 2016-02-09 ENCOUNTER — Other Ambulatory Visit: Payer: Self-pay

## 2016-02-09 DIAGNOSIS — Z1231 Encounter for screening mammogram for malignant neoplasm of breast: Secondary | ICD-10-CM

## 2016-02-23 ENCOUNTER — Other Ambulatory Visit (INDEPENDENT_AMBULATORY_CARE_PROVIDER_SITE_OTHER): Payer: Federal, State, Local not specified - PPO

## 2016-02-23 DIAGNOSIS — I10 Essential (primary) hypertension: Secondary | ICD-10-CM

## 2016-02-23 DIAGNOSIS — R739 Hyperglycemia, unspecified: Secondary | ICD-10-CM | POA: Diagnosis not present

## 2016-02-23 LAB — COMPREHENSIVE METABOLIC PANEL
ALBUMIN: 4 g/dL (ref 3.5–5.2)
ALK PHOS: 76 U/L (ref 39–117)
ALT: 30 U/L (ref 0–35)
AST: 20 U/L (ref 0–37)
BILIRUBIN TOTAL: 0.3 mg/dL (ref 0.2–1.2)
BUN: 21 mg/dL (ref 6–23)
CALCIUM: 9.3 mg/dL (ref 8.4–10.5)
CO2: 30 mEq/L (ref 19–32)
Chloride: 107 mEq/L (ref 96–112)
Creatinine, Ser: 0.72 mg/dL (ref 0.40–1.20)
GFR: 87.49 mL/min (ref >60.00)
GLUCOSE: 121 mg/dL — AB (ref 70–99)
Potassium: 4.2 mEq/L (ref 3.5–5.1)
Sodium: 141 mEq/L (ref 135–145)
TOTAL PROTEIN: 6.5 g/dL (ref 6.0–8.3)

## 2016-02-23 LAB — LIPID PANEL
CHOLESTEROL: 92 mg/dL (ref 0–200)
HDL: 33 mg/dL — AB (ref >39.00)
LDL Cholesterol: 50 mg/dL (ref 0–99)
NonHDL: 59.21
TRIGLYCERIDES: 44 mg/dL (ref 0.0–149.0)
Total CHOL/HDL Ratio: 3
VLDL: 8.8 mg/dL (ref 0.0–40.0)

## 2016-02-23 LAB — HEMOGLOBIN A1C: Hgb A1c MFr Bld: 6.5 % (ref 4.6–6.5)

## 2016-02-29 ENCOUNTER — Ambulatory Visit (INDEPENDENT_AMBULATORY_CARE_PROVIDER_SITE_OTHER): Payer: Federal, State, Local not specified - PPO | Admitting: Family Medicine

## 2016-02-29 ENCOUNTER — Encounter: Payer: Self-pay | Admitting: Family Medicine

## 2016-02-29 VITALS — BP 110/74 | HR 78 | Temp 98.3°F | Ht 62.0 in | Wt 177.8 lb

## 2016-02-29 DIAGNOSIS — M546 Pain in thoracic spine: Secondary | ICD-10-CM

## 2016-02-29 DIAGNOSIS — I10 Essential (primary) hypertension: Secondary | ICD-10-CM | POA: Diagnosis not present

## 2016-02-29 MED ORDER — OLMESARTAN MEDOXOMIL 20 MG PO TABS: ORAL_TABLET | ORAL | Status: DC

## 2016-02-29 MED ORDER — CYCLOBENZAPRINE HCL 10 MG PO TABS: 10.0000 mg | ORAL_TABLET | Freq: Three times a day (TID) | ORAL | Status: DC | PRN

## 2016-02-29 NOTE — Patient Instructions (Signed)
+      Thoracic Strain A thoracic strain, which is sometimes called a mid-back strain, is an injury to the muscles or tendons that attach to the upper part of your back behind your chest. This type of injury occurs when a muscle is overstretched or overloaded.  Thoracic strains can range from mild to severe. Mild strains may involve stretching a muscle or tendon without tearing it. These injuries may heal in 1-2 weeks. More severe strains involve tearing of muscle fibers or tendons. These will cause more pain and may take 6-8 weeks to heal. CAUSES This condition may be caused by:  An injury in which a sudden force is placed on the muscle.  Exercising without properly warming up.  Overuse of the muscle.  Improper form during certain movements.  Other injuries that surround or cause stress on the mid-back, causing a strain on the muscles. In some cases, the cause may not be known. RISK FACTORS This injury is more common in:  Athletes.  People with obesity. SYMPTOMS The main symptom of this condition is pain, especially with movement. Other symptoms include:  Bruising.  Swelling.  Spasm. DIAGNOSIS This condition may be diagnosed with a physical exam. X-rays may be taken to check for a fracture. TREATMENT This condition may be treated with:  Resting and icing the injured area.  Physical therapy. This will involve doing stretching and strengthening exercises.  Medicines for pain and inflammation. HOME CARE INSTRUCTIONS  Rest as needed. Follow instructions from your health care provider about any restrictions on activity.  If directed, apply ice to the injured area:  Put ice in a plastic bag.  Place a towel between your skin and the bag.  Leave the ice on for 20 minutes, 2-3 times per day.  Take over-the-counter and prescription medicines only as told by your health care provider.  Begin doing exercises as told by your health care provider or physical  therapist.  Always warm up properly before physical activity or sports.  Bend your knees before you lift heavy objects.  Keep all follow-up visits as told by your health care provider. This is important. SEEK MEDICAL CARE IF:  Your pain is not helped by medicine.  Your pain, bruising, or swelling is getting worse.  You have a fever. SEEK IMMEDIATE MEDICAL CARE IF:  You have shortness of breath.  You have chest pain.  You develop numbness or weakness in your legs.  You have involuntary loss of urine (urinary incontinence).   This information is not intended to replace advice given to you by your health care provider. Make sure you discuss any questions you have with your health care provider.   Document Released: 12/31/2003 Document Revised: 07/01/2015 Document Reviewed: 12/04/2014 Elsevier Interactive Patient Education [employer].

## 2016-02-29 NOTE — Assessment & Plan Note (Signed)
Stable con't meds 

## 2016-02-29 NOTE — Progress Notes (Signed)
Pre visit review using our clinic review tool, if applicable. No additional management support is needed unless otherwise documented below in the visit note. 

## 2016-02-29 NOTE — Progress Notes (Signed)
Subjective:    Patient ID: Miranda Gonzales, female    DOB: [birth year], 60 y.o.   MRN: [id number]  HPI  Patient here for f/u bp and c/o spasms in mid back between shoulder blades.  It started this am while she was putting her makeup on.    Past Medical History  Diagnosis Date  . Hyperlipidemia   . Hypertension   . GERD (gastroesophageal reflux disease)   . Anxiety     Review of Systems  Constitutional: Negative for diaphoresis, appetite change, fatigue and unexpected weight change.  Eyes: Negative for pain, redness and visual disturbance.  Respiratory: Negative for cough, chest tightness, shortness of breath and wheezing.   Cardiovascular: Negative for chest pain, palpitations and leg swelling.  Endocrine: Negative for cold intolerance, heat intolerance, polydipsia, polyphagia and polyuria.  Genitourinary: Negative for dysuria, frequency and difficulty urinating.  Musculoskeletal: Positive for back pain. Negative for myalgias and neck pain.  Neurological: Negative for dizziness, light-headedness, numbness and headaches.       Objective:    Physical Exam  Constitutional: She is oriented to person, place, and time. She appears well-developed and well-nourished.  HENT:  Head: Normocephalic and atraumatic.  Eyes: Conjunctivae and EOM are normal.  Neck: Normal range of motion. Neck supple. No JVD present. Carotid bruit is not present. No thyromegaly present.  Cardiovascular: Normal rate, regular rhythm and normal heart sounds.   No murmur heard. Pulmonary/Chest: Effort normal and breath sounds normal. No respiratory distress. She has no wheezes. She has no rales. She exhibits no tenderness.  Musculoskeletal: She exhibits tenderness. She exhibits no edema.       Arms: Neurological: She is alert and oriented to person, place, and time.  Psychiatric: She has a normal mood and affect. Her behavior is normal.  Nursing note and vitals reviewed.   BP 110/74 mmHg  Pulse 78   Temp(Src) 98.3 F (36.8 C) (Oral)  Ht 5\' 2"  (1.575 m)  Wt 177 lb 12.8 oz (80.65 kg)  BMI 32.51 kg/m2  SpO2 95% Wt Readings from Last 3 Encounters:  02/29/16 177 lb 12.8 oz (80.65 kg)  02/08/16 181 lb (82.101 kg)  01/14/16 182 lb 12.8 oz (82.918 kg)     Lab Results  Component Value Date   WBC 7.5 02/27/2014   HGB 14.5 02/27/2014   HCT 44.3 02/27/2014   PLT 310.0 02/27/2014   GLUCOSE 121* 02/23/2016   CHOL 92 02/23/2016   TRIG 44.0 02/23/2016   HDL 33.00* 02/23/2016   LDLCALC 50 02/23/2016   ALT 30 02/23/2016   AST 20 02/23/2016   NA 141 02/23/2016   K 4.2 02/23/2016   CL 107 02/23/2016   CREATININE 0.72 02/23/2016   BUN 21 02/23/2016   CO2 30 02/23/2016   TSH 2.05 03/25/2015   HGBA1C 6.5 02/23/2016   MICROALBUR <0.7 03/25/2015    Mm Screening Breast Tomo Bilateral  03/13/2015  CLINICAL DATA:  Screening. EXAM: DIGITAL SCREENING BILATERAL MAMMOGRAM WITH 3D TOMO WITH CAD COMPARISON:  Previous exam(s). ACR Breast Density Category b: There are scattered areas of fibroglandular density. FINDINGS: There are no findings suspicious for malignancy. Images were processed with CAD. IMPRESSION: No mammographic evidence of malignancy. A result letter of this screening mammogram will be mailed directly to the patient. RECOMMENDATION: Screening mammogram in one year. (Code:SM-B-01Y) BI-RADS CATEGORY  1: Negative. Electronically Signed   By: Conchita Paris M.D.   On: 03/13/2015 08:53       Assessment & Plan:  Problem List Items Addressed This Visit      Unprioritized   Essential hypertension    Stable con't meds       Relevant Medications   olmesartan (BENICAR) 20 MG tablet    Other Visit Diagnoses    Midline thoracic back pain    -  Primary    Relevant Medications    cyclobenzaprine (FLEXERIL) 10 MG tablet        Ann Held, DO

## 2016-03-18 ENCOUNTER — Ambulatory Visit
Admission: RE | Admit: 2016-03-18 | Discharge: 2016-03-18 | Disposition: A | Discharge status: Home / Self Care | Payer: Federal, State, Local not specified - PPO | Source: Ambulatory Visit

## 2016-03-18 DIAGNOSIS — Z1231 Encounter for screening mammogram for malignant neoplasm of breast: Secondary | ICD-10-CM

## 2016-06-04 ENCOUNTER — Other Ambulatory Visit: Payer: Self-pay | Admitting: Internal Medicine

## 2016-06-06 NOTE — Telephone Encounter (Signed)
Routing to patient's new pcp to handle 

## 2016-06-19 ENCOUNTER — Other Ambulatory Visit: Payer: Self-pay | Admitting: Internal Medicine

## 2016-06-21 ENCOUNTER — Other Ambulatory Visit: Payer: Self-pay | Admitting: Internal Medicine

## 2016-06-23 ENCOUNTER — Telehealth: Payer: Self-pay | Admitting: Family Medicine

## 2016-06-23 DIAGNOSIS — E785 Hyperlipidemia, unspecified: Secondary | ICD-10-CM

## 2016-06-23 NOTE — Telephone Encounter (Signed)
Relation to [id number] Call back [phone] Pharmacy: [facility] [place], [place] [phone] ([phone] (Fax)     Reason for call:  Patient requesting a refill simvastatin (ZOCOR) 20 MG tablet

## 2016-06-24 MED ORDER — SIMVASTATIN 20 MG PO TABS: 20.0000 mg | ORAL_TABLET | Freq: Every day | ORAL | 1 refills | Status: DC

## 2016-09-02 ENCOUNTER — Other Ambulatory Visit: Payer: Self-pay | Admitting: Emergency Medicine

## 2016-09-02 MED ORDER — ESCITALOPRAM OXALATE 10 MG PO TABS: ORAL_TABLET | ORAL | 0 refills | Status: DC

## 2016-09-06 ENCOUNTER — Other Ambulatory Visit: Payer: Self-pay

## 2016-11-30 DIAGNOSIS — H5213 Myopia, bilateral: Secondary | ICD-10-CM | POA: Diagnosis not present

## 2016-11-30 DIAGNOSIS — H43813 Vitreous degeneration, bilateral: Secondary | ICD-10-CM | POA: Diagnosis not present

## 2016-12-03 ENCOUNTER — Other Ambulatory Visit: Payer: Self-pay | Admitting: Family Medicine

## 2016-12-05 NOTE — Telephone Encounter (Signed)
Pt has f/u with PCP on 12/23/16. Lexapro refill sent to pharmacy.

## 2016-12-20 ENCOUNTER — Other Ambulatory Visit: Payer: Self-pay | Admitting: Family Medicine

## 2016-12-20 DIAGNOSIS — E785 Hyperlipidemia, unspecified: Secondary | ICD-10-CM

## 2016-12-23 ENCOUNTER — Encounter: Payer: Self-pay | Admitting: Family Medicine

## 2016-12-23 ENCOUNTER — Ambulatory Visit (INDEPENDENT_AMBULATORY_CARE_PROVIDER_SITE_OTHER): Payer: Federal, State, Local not specified - PPO | Admitting: Family Medicine

## 2016-12-23 VITALS — BP 112/70 | HR 72 | Temp 98.0°F | Resp 16 | Ht 62.0 in | Wt 191.6 lb

## 2016-12-23 DIAGNOSIS — I10 Essential (primary) hypertension: Secondary | ICD-10-CM

## 2016-12-23 DIAGNOSIS — Z23 Encounter for immunization: Secondary | ICD-10-CM | POA: Diagnosis not present

## 2016-12-23 DIAGNOSIS — E119 Type 2 diabetes mellitus without complications: Secondary | ICD-10-CM | POA: Diagnosis not present

## 2016-12-23 DIAGNOSIS — E785 Hyperlipidemia, unspecified: Secondary | ICD-10-CM | POA: Diagnosis not present

## 2016-12-23 LAB — LIPID PANEL
CHOL/HDL RATIO: 3.2 ratio (ref <5.0)
CHOLESTEROL: 121 mg/dL (ref <200)
HDL: 38 mg/dL — ABNORMAL LOW (ref >50)
LDL Cholesterol: 67 mg/dL (ref <100)
TRIGLYCERIDES: 82 mg/dL (ref <150)
VLDL: 16 mg/dL (ref <30)

## 2016-12-23 LAB — CBC WITH DIFFERENTIAL/PLATELET
BASOS ABS: 0 {cells}/uL (ref 0–200)
Basophils Relative: 0 %
EOS PCT: 4 %
Eosinophils Absolute: 364 cells/uL (ref 15–500)
HEMATOCRIT: 46.2 % — AB (ref 35.0–45.0)
HEMOGLOBIN: 15.1 g/dL (ref 11.7–15.5)
LYMPHS ABS: 2366 {cells}/uL (ref 850–3900)
Lymphocytes Relative: 26 %
MCH: 28.9 pg (ref 27.0–33.0)
MCHC: 32.7 g/dL (ref 32.0–36.0)
MCV: 88.3 fL (ref 80.0–100.0)
MONO ABS: 910 {cells}/uL (ref 200–950)
MPV: 11.8 fL (ref 7.5–12.5)
Monocytes Relative: 10 %
NEUTROS ABS: 5460 {cells}/uL (ref 1500–7800)
Neutrophils Relative %: 60 %
Platelets: 291 10*3/uL (ref 140–400)
RBC: 5.23 MIL/uL — AB (ref 3.80–5.10)
RDW: 13.1 % (ref 11.0–15.0)
WBC: 9.1 10*3/uL (ref 3.8–10.8)

## 2016-12-23 LAB — COMPREHENSIVE METABOLIC PANEL
ALT: 41 U/L — AB (ref 6–29)
AST: 25 U/L (ref 10–35)
Albumin: 4 g/dL (ref 3.6–5.1)
Alkaline Phosphatase: 85 U/L (ref 33–130)
BILIRUBIN TOTAL: 0.4 mg/dL (ref 0.2–1.2)
BUN: 13 mg/dL (ref 7–25)
CHLORIDE: 106 mmol/L (ref 98–110)
CO2: 27 mmol/L (ref 20–31)
CREATININE: 0.76 mg/dL (ref 0.50–0.99)
Calcium: 9.8 mg/dL (ref 8.6–10.4)
Glucose, Bld: 85 mg/dL (ref 65–99)
Potassium: 4.8 mmol/L (ref 3.5–5.3)
SODIUM: 142 mmol/L (ref 135–146)
TOTAL PROTEIN: 6.3 g/dL (ref 6.1–8.1)

## 2016-12-23 LAB — POC URINALSYSI DIPSTICK (AUTOMATED)
Bilirubin, UA: NEGATIVE
GLUCOSE UA: NEGATIVE
KETONES UA: NEGATIVE
Leukocytes, UA: NEGATIVE
Nitrite, UA: NEGATIVE
PROTEIN UA: NEGATIVE
RBC UA: NEGATIVE
Urobilinogen, UA: 0.2
pH, UA: 6

## 2016-12-23 MED ORDER — SIMVASTATIN 20 MG PO TABS: 20.0000 mg | ORAL_TABLET | Freq: Every day | ORAL | 1 refills | Status: DC

## 2016-12-23 MED ORDER — OLMESARTAN MEDOXOMIL 20 MG PO TABS: ORAL_TABLET | ORAL | 3 refills | Status: DC

## 2016-12-23 NOTE — Assessment & Plan Note (Signed)
Check labs con't meds 

## 2016-12-23 NOTE — Assessment & Plan Note (Signed)
Stable con't meds 

## 2016-12-23 NOTE — Progress Notes (Signed)
Patient ID: Miranda Gonzales, female    DOB: [birth year]  Age: 61 y.o. MRN: [id number]    Subjective:  Subjective  HPI Miranda Gonzales presents for f/u dm, cholesterol and htn.  Pt has not been good with diet or exercise .    Review of Systems  Constitutional: Negative for appetite change, diaphoresis, fatigue and unexpected weight change.  Eyes: Negative for pain, redness and visual disturbance.  Respiratory: Negative for cough, chest tightness, shortness of breath and wheezing.   Cardiovascular: Negative for chest pain, palpitations and leg swelling.  Endocrine: Negative for cold intolerance, heat intolerance, polydipsia, polyphagia and polyuria.  Genitourinary: Negative for difficulty urinating, dysuria and frequency.  Neurological: Negative for dizziness, light-headedness, numbness and headaches.    History Past Medical History:  Diagnosis Date  . Anxiety   . GERD (gastroesophageal reflux disease)   . Hyperlipidemia   . Hypertension     She has a past surgical history that includes Colonoscopy w/ polypectomy (04/2009); Tubal ligation; and Colonoscopy with propofol (N/A, 08/12/2014).   Her family history includes Cancer in her mother; Colon polyps in her father; Diabetes in her mother; Heart attack in her father; Hyperlipidemia in her sister; Hypertension in her mother and sister; Stroke in her mother.She reports that she has never smoked. She has never used smokeless tobacco. She reports that she does not drink alcohol or use drugs.  Current Outpatient Prescriptions on File Prior to Visit  Medication Sig Dispense Refill  . escitalopram (LEXAPRO) 10 MG tablet TAKE 1 TABLET(10 MG) BY MOUTH EVERY MORNING 90 tablet 0  . ibuprofen (ADVIL,MOTRIN) 200 MG tablet Take 800 mg by mouth once as needed for headache or mild pain.    . cyclobenzaprine (FLEXERIL) 10 MG tablet Take 1 tablet (10 mg total) by mouth 3 (three) times daily as needed for muscle spasms. (Patient not taking: Reported on  12/23/2016) 30 tablet 0  . mometasone (ELOCON) 0.1 % ointment   2   No current facility-administered medications on file prior to visit.      Objective:  Objective  Physical Exam  Constitutional: She is oriented to person, place, and time. She appears well-developed and well-nourished.  HENT:  Head: Normocephalic and atraumatic.  Eyes: Conjunctivae and EOM are normal.  Neck: Normal range of motion. Neck supple. No JVD present. Carotid bruit is not present. No thyromegaly present.  Cardiovascular: Normal rate, regular rhythm and normal heart sounds.   No murmur heard. Pulmonary/Chest: Effort normal and breath sounds normal. No respiratory distress. She has no wheezes. She has no rales. She exhibits no tenderness.  Musculoskeletal: She exhibits no edema.  Neurological: She is alert and oriented to person, place, and time.  Psychiatric: She has a normal mood and affect. Her behavior is normal. Judgment and thought content normal.  Nursing note and vitals reviewed. Sensory exam of the foot is normal, tested with the monofilament. Good pulses, no lesions or ulcers, good peripheral pulses.  BP 112/70 (BP Location: Left Arm, Patient Position: Sitting, Cuff Size: Large)   Pulse 72   Temp 98 F (36.7 C) (Oral)   Resp 16   Ht 5\' 2"  (1.575 m)   Wt 191 lb 9.6 oz (86.9 kg)   SpO2 96%   BMI 35.04 kg/m  Wt Readings from Last 3 Encounters:  12/23/16 191 lb 9.6 oz (86.9 kg)  02/29/16 177 lb 12.8 oz (80.6 kg)  02/08/16 181 lb (82.1 kg)     Lab Results  Component Value Date  WBC 7.5 02/27/2014   HGB 14.5 02/27/2014   HCT 44.3 02/27/2014   PLT 310.0 02/27/2014   GLUCOSE 121 (H) 02/23/2016   CHOL 92 02/23/2016   TRIG 44.0 02/23/2016   HDL 33.00 (L) 02/23/2016   LDLCALC 50 02/23/2016   ALT 30 02/23/2016   AST 20 02/23/2016   NA 141 02/23/2016   K 4.2 02/23/2016   CL 107 02/23/2016   CREATININE 0.72 02/23/2016   BUN 21 02/23/2016   CO2 30 02/23/2016   TSH 2.05 03/25/2015   HGBA1C  6.5 02/23/2016   MICROALBUR <0.7 03/25/2015    Mm Screening Breast Tomo Bilateral  Result Date: 03/18/2016 CLINICAL DATA:  Screening. EXAM: 2D DIGITAL SCREENING BILATERAL MAMMOGRAM WITH CAD AND ADJUNCT TOMO COMPARISON:  Previous exam(s). ACR Breast Density Category b: There are scattered areas of fibroglandular density. FINDINGS: There are no findings suspicious for malignancy. Images were processed with CAD. IMPRESSION: No mammographic evidence of malignancy. A result letter of this screening mammogram will be mailed directly to the patient. RECOMMENDATION: Screening mammogram in one year. (Code:[id number]-B-01Y) BI-RADS CATEGORY  1: Negative. Electronically Signed   By: Abelardo Diesel M.D.   On: 03/22/2016 07:57     Assessment & Plan:  Plan  I have changed Miranda Gonzales's simvastatin. I am also having her maintain her ibuprofen, mometasone, cyclobenzaprine, escitalopram, and olmesartan.  Meds ordered this encounter  Medications  . simvastatin (ZOCOR) 20 MG tablet    Sig: Take 1 tablet (20 mg total) by mouth daily at 6 PM.    Dispense:  90 tablet    Refill:  1  . olmesartan (BENICAR) 20 MG tablet    Sig: 1/2 tab po qd    Dispense:  45 tablet    Refill:  3    Problem List Items Addressed This Visit      Unprioritized   Diabetes mellitus type 2, controlled, without complications (HCC)    Stable con't meds  Check labs      Relevant Medications   simvastatin (ZOCOR) 20 MG tablet   olmesartan (BENICAR) 20 MG tablet   Essential hypertension    Stable con't meds      Relevant Medications   simvastatin (ZOCOR) 20 MG tablet   olmesartan (BENICAR) 20 MG tablet   Other Relevant Orders   CBC with Differential/Platelet   Comprehensive metabolic panel   POCT Urinalysis Dipstick (Automated) (Completed)   Hyperlipidemia    Check labs  con't meds      Relevant Medications   simvastatin (ZOCOR) 20 MG tablet   olmesartan (BENICAR) 20 MG tablet    Other Visit Diagnoses    Type 2 diabetes,  diet controlled (HCC)    -  Primary   Relevant Medications   simvastatin (ZOCOR) 20 MG tablet   olmesartan (BENICAR) 20 MG tablet   Other Relevant Orders   Hemoglobin A1c   Comprehensive metabolic panel   Hyperlipidemia LDL goal <100       Relevant Medications   simvastatin (ZOCOR) 20 MG tablet   olmesartan (BENICAR) 20 MG tablet   Other Relevant Orders   Lipid panel   Comprehensive metabolic panel   Need for diphtheria-tetanus-pertussis (Tdap) vaccine       Relevant Orders   Tdap vaccine greater than or equal to 7yo IM (Completed)   Need for 23-polyvalent pneumococcal polysaccharide vaccine       Relevant Orders   Pneumococcal polysaccharide vaccine 23-valent greater than or equal to 2yo subcutaneous/IM (Completed)  Follow-up: Return in about 3 months (around 03/25/2017) for hypertension, hyperlipidemia, diabetes II.  Ann Held, DO

## 2016-12-23 NOTE — Assessment & Plan Note (Signed)
Stable con't meds Check labs 

## 2016-12-23 NOTE — Progress Notes (Signed)
Pre visit review using our clinic review tool, if applicable. No additional management support is needed unless otherwise documented below in the visit note. 

## 2016-12-23 NOTE — Patient Instructions (Signed)
Carbohydrate Counting for Diabetes Mellitus, Adult Carbohydrate counting is a method for keeping track of how many carbohydrates you eat. Eating carbohydrates naturally increases the amount of sugar (glucose) in the blood. Counting how many carbohydrates you eat helps keep your blood glucose within normal limits, which helps you manage your diabetes (diabetes mellitus). It is important to know how many carbohydrates you can safely have in each meal. This is different for every person. A diet and nutrition specialist (registered dietitian) can help you make a meal plan and calculate how many carbohydrates you should have at each meal and snack. Carbohydrates are found in the following foods:  Grains, such as breads and cereals.  Dried beans and soy products.  Starchy vegetables, such as potatoes, peas, and corn.  Fruit and fruit juices.  Milk and yogurt.  Sweets and snack foods, such as cake, cookies, candy, chips, and soft drinks. How do I count carbohydrates? There are two ways to count carbohydrates in food. You can use either of the methods or a combination of both. Reading "Nutrition Facts" on packaged food  The "Nutrition Facts" list is included on the labels of almost all packaged foods and beverages in the [place] It includes:  The serving size.  Information about nutrients in each serving, including the grams (g) of carbohydrate per serving. To use the "Nutrition Facts":  Decide how many servings you will have.  Multiply the number of servings by the number of carbohydrates per serving.  The resulting number is the total amount of carbohydrates that you will be having. Learning standard serving sizes of other foods  When you eat foods containing carbohydrates that are not packaged or do not include "Nutrition Facts" on the label, you need to measure the servings in order to count the amount of carbohydrates:  Measure the foods that you will eat with a food scale or measuring  cup, if needed.  Decide how many standard-size servings you will eat.  Multiply the number of servings by 15. Most carbohydrate-rich foods have about 15 g of carbohydrates per serving.  For example, if you eat 8 oz (170 g) of strawberries, you will have eaten 2 servings and 30 g of carbohydrates (2 servings x 15 g = 30 g).  For foods that have more than one food mixed, such as soups and casseroles, you must count the carbohydrates in each food that is included. The following list contains standard serving sizes of common carbohydrate-rich foods. Each of these servings has about 15 g of carbohydrates:   hamburger bun or  English muffin.   oz (15 mL) syrup.   oz (14 g) jelly.  1 slice of bread.  1 six-inch tortilla.  3 oz (85 g) cooked rice or pasta.  4 oz (113 g) cooked dried beans.  4 oz (113 g) starchy vegetable, such as peas, corn, or potatoes.  4 oz (113 g) hot cereal.  4 oz (113 g) mashed potatoes or  of a large baked potato.  4 oz (113 g) canned or frozen fruit.  4 oz (120 mL) fruit juice.  4-6 crackers.  6 chicken nuggets.  6 oz (170 g) unsweetened dry cereal.  6 oz (170 g) plain fat-free yogurt or yogurt sweetened with artificial sweeteners.  8 oz (240 mL) milk.  8 oz (170 g) fresh fruit or one small piece of fruit.  24 oz (680 g) popped popcorn. Example of carbohydrate counting Sample meal  3 oz (85 g) chicken breast.  6 oz (  170 g) brown rice.  4 oz (113 g) corn.  8 oz (240 mL) milk.  8 oz (170 g) strawberries with sugar-free whipped topping. Carbohydrate calculation 1. Identify the foods that contain carbohydrates:  Rice.  Corn.  Milk.  Strawberries. 2. Calculate how many servings you have of each food:  2 servings rice.  1 serving corn.  1 serving milk.  1 serving strawberries. 3. Multiply each number of servings by 15 g:  2 servings rice x 15 g = 30 g.  1 serving corn x 15 g = 15 g.  1 serving milk x 15 g = 15  g.  1 serving strawberries x 15 g = 15 g. 4. Add together all of the amounts to find the total grams of carbohydrates eaten:  30 g + 15 g + 15 g + 15 g = 75 g of carbohydrates total. This information is not intended to replace advice given to you by your health care provider. Make sure you discuss any questions you have with your health care provider. Document Released: 10/10/2005 Document Revised: 04/29/2016 Document Reviewed: 03/23/2016 Elsevier Interactive Patient Education  2017 Elsevier Inc.  

## 2016-12-24 LAB — HEMOGLOBIN A1C
Hgb A1c MFr Bld: 6.1 % — ABNORMAL HIGH (ref <5.7)
MEAN PLASMA GLUCOSE: 128 mg/dL

## 2017-01-04 DIAGNOSIS — K080 Exfoliation of teeth due to systemic causes: Secondary | ICD-10-CM | POA: Diagnosis not present

## 2017-01-23 ENCOUNTER — Telehealth: Payer: Self-pay | Admitting: Family Medicine

## 2017-01-23 NOTE — Telephone Encounter (Signed)
Caller name: Zissel Relationship to patient: self Can be reached: [phone]  Reason for call: pt asking if we have received the more potent shingles shot. Please call her back. Ok to leave msg.

## 2017-01-23 NOTE — Telephone Encounter (Signed)
Called left detailed message that "Yes" we do have and PCP instructed for her to call her insurance company to determine her cost/if any, then she can schedule a nurse visit appointment here at our office to have done.

## 2017-02-15 ENCOUNTER — Other Ambulatory Visit: Payer: Self-pay | Admitting: Obstetrics and Gynecology

## 2017-02-15 DIAGNOSIS — Z1231 Encounter for screening mammogram for malignant neoplasm of breast: Secondary | ICD-10-CM

## 2017-03-02 ENCOUNTER — Other Ambulatory Visit: Payer: Self-pay | Admitting: Family Medicine

## 2017-03-05 ENCOUNTER — Other Ambulatory Visit: Payer: Self-pay | Admitting: Family Medicine

## 2017-03-05 DIAGNOSIS — I10 Essential (primary) hypertension: Secondary | ICD-10-CM

## 2017-03-21 ENCOUNTER — Ambulatory Visit: Payer: Federal, State, Local not specified - PPO

## 2017-03-22 ENCOUNTER — Other Ambulatory Visit: Payer: Self-pay | Admitting: Family Medicine

## 2017-03-22 DIAGNOSIS — E785 Hyperlipidemia, unspecified: Secondary | ICD-10-CM

## 2017-03-28 ENCOUNTER — Ambulatory Visit
Admission: RE | Admit: 2017-03-28 | Discharge: 2017-03-28 | Disposition: A | Discharge status: Home / Self Care | Payer: Federal, State, Local not specified - PPO | Source: Ambulatory Visit | Attending: Obstetrics and Gynecology | Admitting: Obstetrics and Gynecology

## 2017-03-28 DIAGNOSIS — Z1231 Encounter for screening mammogram for malignant neoplasm of breast: Secondary | ICD-10-CM | POA: Diagnosis not present

## 2017-03-31 ENCOUNTER — Ambulatory Visit: Payer: Federal, State, Local not specified - PPO | Admitting: Family Medicine

## 2017-04-06 DIAGNOSIS — D225 Melanocytic nevi of trunk: Secondary | ICD-10-CM | POA: Diagnosis not present

## 2017-04-06 DIAGNOSIS — L814 Other melanin hyperpigmentation: Secondary | ICD-10-CM | POA: Diagnosis not present

## 2017-04-06 DIAGNOSIS — L570 Actinic keratosis: Secondary | ICD-10-CM | POA: Diagnosis not present

## 2017-05-26 ENCOUNTER — Other Ambulatory Visit: Payer: Self-pay | Admitting: Family Medicine

## 2017-05-26 DIAGNOSIS — I10 Essential (primary) hypertension: Secondary | ICD-10-CM

## 2017-06-19 ENCOUNTER — Telehealth: Payer: Self-pay | Admitting: Family Medicine

## 2017-06-19 DIAGNOSIS — I10 Essential (primary) hypertension: Secondary | ICD-10-CM

## 2017-06-19 DIAGNOSIS — E119 Type 2 diabetes mellitus without complications: Secondary | ICD-10-CM

## 2017-06-19 DIAGNOSIS — E785 Hyperlipidemia, unspecified: Secondary | ICD-10-CM

## 2017-06-19 NOTE — Telephone Encounter (Signed)
Pt says that she would like to have her shingles vac and has to have a recheck A1C. Pt says that she was advised to come back and repeat lab in Sept.    Is it okay to schedule shingles? And also could orders please be placed for lab

## 2017-06-19 NOTE — Telephone Encounter (Signed)
Shingrix out of stock (on back order). Labs ordered- needs lab appt (Fasting). Please schedule at her convenience. Thank you.

## 2017-06-20 ENCOUNTER — Other Ambulatory Visit: Payer: Self-pay | Admitting: Family Medicine

## 2017-06-20 DIAGNOSIS — E785 Hyperlipidemia, unspecified: Secondary | ICD-10-CM

## 2017-06-20 NOTE — Telephone Encounter (Signed)
Called. Lvm for pt to return call to schedule labs. Advised of shingles vac.

## 2017-06-21 ENCOUNTER — Other Ambulatory Visit: Payer: Self-pay | Admitting: Family Medicine

## 2017-06-21 DIAGNOSIS — E785 Hyperlipidemia, unspecified: Secondary | ICD-10-CM

## 2017-06-21 NOTE — Telephone Encounter (Signed)
Sent Rx to pharmacy. LB 

## 2017-06-27 ENCOUNTER — Other Ambulatory Visit (INDEPENDENT_AMBULATORY_CARE_PROVIDER_SITE_OTHER): Payer: Federal, State, Local not specified - PPO

## 2017-06-27 DIAGNOSIS — E785 Hyperlipidemia, unspecified: Secondary | ICD-10-CM

## 2017-06-27 DIAGNOSIS — I10 Essential (primary) hypertension: Secondary | ICD-10-CM | POA: Diagnosis not present

## 2017-06-27 DIAGNOSIS — E119 Type 2 diabetes mellitus without complications: Secondary | ICD-10-CM

## 2017-06-27 LAB — LIPID PANEL
CHOL/HDL RATIO: 4
Cholesterol: 138 mg/dL (ref 0–200)
HDL: 36.2 mg/dL — ABNORMAL LOW (ref >39.00)
LDL CALC: 82 mg/dL (ref 0–99)
NonHDL: 102.17
TRIGLYCERIDES: 99 mg/dL (ref 0.0–149.0)
VLDL: 19.8 mg/dL (ref 0.0–40.0)

## 2017-06-27 LAB — COMPREHENSIVE METABOLIC PANEL
ALT: 29 U/L (ref 0–35)
AST: 20 U/L (ref 0–37)
Albumin: 3.8 g/dL (ref 3.5–5.2)
Alkaline Phosphatase: 104 U/L (ref 39–117)
BILIRUBIN TOTAL: 0.4 mg/dL (ref 0.2–1.2)
BUN: 14 mg/dL (ref 6–23)
CALCIUM: 9.3 mg/dL (ref 8.4–10.5)
CHLORIDE: 105 meq/L (ref 96–112)
CO2: 30 meq/L (ref 19–32)
CREATININE: 0.85 mg/dL (ref 0.40–1.20)
GFR: 71.92 mL/min (ref >60.00)
GLUCOSE: 131 mg/dL — AB (ref 70–99)
Potassium: 4.3 mEq/L (ref 3.5–5.1)
Sodium: 140 mEq/L (ref 135–145)
Total Protein: 6.3 g/dL (ref 6.0–8.3)

## 2017-06-27 LAB — HEMOGLOBIN A1C: Hgb A1c MFr Bld: 6.4 % (ref 4.6–6.5)

## 2017-06-30 ENCOUNTER — Other Ambulatory Visit: Payer: Self-pay | Admitting: Family Medicine

## 2017-06-30 DIAGNOSIS — E119 Type 2 diabetes mellitus without complications: Secondary | ICD-10-CM

## 2017-06-30 DIAGNOSIS — E785 Hyperlipidemia, unspecified: Secondary | ICD-10-CM

## 2017-07-03 DIAGNOSIS — Z6834 Body mass index (BMI) 34.0-34.9, adult: Secondary | ICD-10-CM | POA: Diagnosis not present

## 2017-07-03 DIAGNOSIS — Z01419 Encounter for gynecological examination (general) (routine) without abnormal findings: Secondary | ICD-10-CM | POA: Diagnosis not present

## 2017-07-19 DIAGNOSIS — K080 Exfoliation of teeth due to systemic causes: Secondary | ICD-10-CM | POA: Diagnosis not present

## 2017-08-26 ENCOUNTER — Other Ambulatory Visit: Payer: Self-pay | Admitting: Family Medicine

## 2017-08-26 DIAGNOSIS — I10 Essential (primary) hypertension: Secondary | ICD-10-CM

## 2017-08-29 NOTE — Telephone Encounter (Signed)
Pt is due for follow up please call and schedule appointment.  

## 2017-08-30 NOTE — Telephone Encounter (Signed)
Call pt. LVM to call office back to schedule.

## 2017-09-05 ENCOUNTER — Ambulatory Visit: Payer: Federal, State, Local not specified - PPO | Admitting: Family Medicine

## 2017-09-21 ENCOUNTER — Other Ambulatory Visit: Payer: Self-pay | Admitting: Family Medicine

## 2017-09-21 DIAGNOSIS — E785 Hyperlipidemia, unspecified: Secondary | ICD-10-CM

## 2017-10-05 ENCOUNTER — Ambulatory Visit: Payer: Federal, State, Local not specified - PPO | Admitting: Family Medicine

## 2017-11-09 DIAGNOSIS — D229 Melanocytic nevi, unspecified: Secondary | ICD-10-CM | POA: Diagnosis not present

## 2017-11-09 DIAGNOSIS — L814 Other melanin hyperpigmentation: Secondary | ICD-10-CM | POA: Diagnosis not present

## 2017-11-09 DIAGNOSIS — L821 Other seborrheic keratosis: Secondary | ICD-10-CM | POA: Diagnosis not present

## 2017-11-09 DIAGNOSIS — L820 Inflamed seborrheic keratosis: Secondary | ICD-10-CM | POA: Diagnosis not present

## 2017-11-09 DIAGNOSIS — L570 Actinic keratosis: Secondary | ICD-10-CM | POA: Diagnosis not present

## 2017-11-14 ENCOUNTER — Encounter: Payer: Self-pay | Admitting: Family Medicine

## 2017-11-14 ENCOUNTER — Ambulatory Visit: Payer: Federal, State, Local not specified - PPO | Admitting: Family Medicine

## 2017-11-14 VITALS — BP 120/76 | HR 79 | Temp 97.7°F | Resp 16 | Ht 62.0 in | Wt 185.0 lb

## 2017-11-14 DIAGNOSIS — E119 Type 2 diabetes mellitus without complications: Secondary | ICD-10-CM | POA: Diagnosis not present

## 2017-11-14 DIAGNOSIS — E559 Vitamin D deficiency, unspecified: Secondary | ICD-10-CM

## 2017-11-14 DIAGNOSIS — E785 Hyperlipidemia, unspecified: Secondary | ICD-10-CM

## 2017-11-14 DIAGNOSIS — F411 Generalized anxiety disorder: Secondary | ICD-10-CM

## 2017-11-14 DIAGNOSIS — I10 Essential (primary) hypertension: Secondary | ICD-10-CM

## 2017-11-14 LAB — LIPID PANEL
CHOLESTEROL: 133 mg/dL (ref 0–200)
HDL: 40.8 mg/dL (ref >39.00)
LDL Cholesterol: 77 mg/dL (ref 0–99)
NonHDL: 91.88
TRIGLYCERIDES: 76 mg/dL (ref 0.0–149.0)
Total CHOL/HDL Ratio: 3
VLDL: 15.2 mg/dL (ref 0.0–40.0)

## 2017-11-14 LAB — COMPREHENSIVE METABOLIC PANEL
ALBUMIN: 4.2 g/dL (ref 3.5–5.2)
ALK PHOS: 116 U/L (ref 39–117)
ALT: 34 U/L (ref 0–35)
AST: 22 U/L (ref 0–37)
BUN: 16 mg/dL (ref 6–23)
CALCIUM: 9.5 mg/dL (ref 8.4–10.5)
CO2: 32 mEq/L (ref 19–32)
Chloride: 105 mEq/L (ref 96–112)
Creatinine, Ser: 0.67 mg/dL (ref 0.40–1.20)
GFR: 94.53 mL/min (ref >60.00)
Glucose, Bld: 109 mg/dL — ABNORMAL HIGH (ref 70–99)
Potassium: 4.5 mEq/L (ref 3.5–5.1)
Sodium: 141 mEq/L (ref 135–145)
TOTAL PROTEIN: 7.1 g/dL (ref 6.0–8.3)
Total Bilirubin: 0.5 mg/dL (ref 0.2–1.2)

## 2017-11-14 LAB — MICROALBUMIN / CREATININE URINE RATIO
CREATININE, U: 127.7 mg/dL
MICROALB/CREAT RATIO: 1 mg/g (ref 0.0–30.0)
Microalb, Ur: 1.3 mg/dL (ref 0.0–1.9)

## 2017-11-14 LAB — VITAMIN D 25 HYDROXY (VIT D DEFICIENCY, FRACTURES): VITD: 16.13 ng/mL — ABNORMAL LOW (ref 30.00–100.00)

## 2017-11-14 LAB — HEMOGLOBIN A1C: Hgb A1c MFr Bld: 6.6 % — ABNORMAL HIGH (ref 4.6–6.5)

## 2017-11-14 NOTE — Patient Instructions (Signed)

## 2017-11-14 NOTE — Assessment & Plan Note (Signed)
Well controlled, no changes to meds. Encouraged heart healthy diet such as the DASH diet and exercise as tolerated.  °

## 2017-11-14 NOTE — Progress Notes (Signed)
Patient ID: Miranda Gonzales, female   DOB: 06/28/1956, 62 y.o.   MRN: [id number]    Subjective:  I acted as a Education administrator for Dr. Carollee Herter.  Guerry Bruin, [place]   Patient ID: Miranda Gonzales, female    DOB: 02/25/1956, 63 y.o.   MRN: [id number]  Chief Complaint  Patient presents with  . Diabetes    HPI  Patient is in today for follow up diabetes, htn , and cholesterol HYPERTENSION   Blood pressure range-not checking   Chest pain- no      Dyspnea- no Lightheadedness- no   Edema- no  Other side effects - no   Medication compliance: good Low salt diet- yes    DIABETES    Blood Sugar ranges-not checking   Polyuria- no New Visual problems- no  Hypoglycemic symptoms- no  Other side effects-no Medication compliance - good Last eye exam- [id number] Foot exam- today   HYPERLIPIDEMIA  Medication compliance- good RUQ pain- no  Muscle aches- no Other side effects-no    Patient Care Team: Ann Held, DO as PCP - General (Family Medicine) Everlene Farrier, MD as Consulting Physician (Obstetrics and Gynecology) Katy Apo, MD as Consulting Physician (Ophthalmology)   Past Medical History:  Diagnosis Date  . Anxiety   . GERD (gastroesophageal reflux disease)   . Hyperlipidemia   . Hypertension     Past Surgical History:  Procedure Laterality Date  . COLONOSCOPY W/ POLYPECTOMY  04/2009   due 2015  . COLONOSCOPY WITH PROPOFOL N/A 08/12/2014   Procedure: COLONOSCOPY WITH PROPOFOL;  Surgeon: Garlan Fair, MD;  Location: [facility];  Service: Endoscopy;  Laterality: N/A;  . TUBAL LIGATION      Family History  Problem Relation Age of Onset  . Cancer Mother        rectal  . Diabetes Mother   . Hypertension Mother   . Stroke Mother        in 54s  . Colon polyps Father   . Heart attack Father        45 & 66  . Hyperlipidemia Sister   . Hypertension Sister   . Breast cancer Maternal Aunt     Social History   Socioeconomic History  . Marital status:  Married    Spouse name: Not on file  . Number of children: Not on file  . Years of education: Not on file  . Highest education level: Not on file  Social Needs  . Financial resource strain: Not on file  . Food insecurity - worry: Not on file  . Food insecurity - inability: Not on file  . Transportation needs - medical: Not on file  . Transportation needs - non-medical: Not on file  Occupational History  . Not on file  Tobacco Use  . Smoking status: Never Smoker  . Smokeless tobacco: Never Used  Substance and Sexual Activity  . Alcohol use: No  . Drug use: No  . Sexual activity: Not on file  Other Topics Concern  . Not on file  Social History Narrative  . Not on file    Outpatient Medications Prior to Visit  Medication Sig Dispense Refill  . escitalopram (LEXAPRO) 10 MG tablet TAKE 1 TABLET(10 MG) BY MOUTH EVERY MORNING 90 tablet 2  . olmesartan (BENICAR) 20 MG tablet TAKE 1/2 TABLET BY MOUTH EVERY DAY 45 tablet 0  . simvastatin (ZOCOR) 20 MG tablet TAKE 1 TABLET BY MOUTH EVERY NIGHT AT BEDTIME 90 tablet 0  . cyclobenzaprine (  FLEXERIL) 10 MG tablet Take 1 tablet (10 mg total) by mouth 3 (three) times daily as needed for muscle spasms. (Patient not taking: Reported on 12/23/2016) 30 tablet 0  . ibuprofen (ADVIL,MOTRIN) 200 MG tablet Take 800 mg by mouth once as needed for headache or mild pain.    . mometasone (ELOCON) 0.1 % ointment   2  . olmesartan (BENICAR) 20 MG tablet 1/2 tab po qd 45 tablet 3   No facility-administered medications prior to visit.     Allergies  Allergen Reactions  . Penicillins     REACTION: Rash and itching    Review of Systems  Constitutional: Negative for chills, fever and malaise/fatigue.  HENT: Negative for congestion and hearing loss.   Eyes: Negative for blurred vision and discharge.  Respiratory: Negative for cough, sputum production and shortness of breath.   Cardiovascular: Negative for chest pain, palpitations and leg swelling.    Gastrointestinal: Negative for abdominal pain, blood in stool, constipation, diarrhea, heartburn, nausea and vomiting.  Genitourinary: Negative for dysuria, frequency, hematuria and urgency.  Musculoskeletal: Negative for back pain, falls and myalgias.  Skin: Negative for rash.  Neurological: Negative for dizziness, sensory change, loss of consciousness, weakness and headaches.  Endo/Heme/Allergies: Negative for environmental allergies. Does not bruise/bleed easily.  Psychiatric/Behavioral: Negative for depression and suicidal ideas. The patient is not nervous/anxious and does not have insomnia.        Objective:    Physical Exam  Constitutional: She is oriented to person, place, and time. She appears well-developed and well-nourished.  HENT:  Head: Normocephalic and atraumatic.  Eyes: Conjunctivae and EOM are normal.  Neck: Normal range of motion. Neck supple. No JVD present. Carotid bruit is not present. No thyromegaly present.  Cardiovascular: Normal rate, regular rhythm and normal heart sounds.  No murmur heard. Pulmonary/Chest: Effort normal and breath sounds normal. No respiratory distress. She has no wheezes. She has no rales. She exhibits no tenderness.  Musculoskeletal: She exhibits no edema.  Neurological: She is alert and oriented to person, place, and time.  Psychiatric: She has a normal mood and affect. Her behavior is normal. Judgment and thought content normal.  Nursing note and vitals reviewed. Sensory exam of the foot is normal, tested with the monofilament. Good pulses, no lesions or ulcers, good peripheral pulses.  BP 120/76 (BP Location: Left Arm, Cuff Size: Normal)   Pulse 79   Temp 97.7 F (36.5 C) (Oral)   Resp 16   Ht 5\' 2"  (1.575 m)   Wt 185 lb (83.9 kg)   SpO2 96%   BMI 33.84 kg/m  Wt Readings from Last 3 Encounters:  11/14/17 185 lb (83.9 kg)  12/23/16 191 lb 9.6 oz (86.9 kg)  02/29/16 177 lb 12.8 oz (80.6 kg)   BP Readings from Last 3  Encounters:  11/14/17 120/76  12/23/16 112/70  02/29/16 110/74     Immunization History  Administered Date(s) Administered  . Pneumococcal Conjugate-13 03/30/2015  . Pneumococcal Polysaccharide-23 12/23/2016  . Tdap 12/23/2016  . Zoster 03/30/2015    Health Maintenance  Topic Date Due  . OPHTHALMOLOGY EXAM  11/25/2017  . HEMOGLOBIN A1C  12/25/2017  . MAMMOGRAM  03/28/2018  . PAP SMEAR  05/24/2018  . FOOT EXAM  11/14/2018  . PNEUMOCOCCAL POLYSACCHARIDE VACCINE (2) 12/23/2021  . COLONOSCOPY  08/12/2024  . TETANUS/TDAP  12/24/2026  . INFLUENZA VACCINE  Completed  . Hepatitis C Screening  Completed  . HIV Screening  Completed    Lab Results  Component Value Date   WBC 9.1 12/23/2016   HGB 15.1 12/23/2016   HCT 46.2 (H) 12/23/2016   PLT 291 12/23/2016   GLUCOSE 131 (H) 06/27/2017   CHOL 138 06/27/2017   TRIG 99.0 06/27/2017   HDL 36.20 (L) 06/27/2017   LDLCALC 82 06/27/2017   ALT 29 06/27/2017   AST 20 06/27/2017   NA 140 06/27/2017   K 4.3 06/27/2017   CL 105 06/27/2017   CREATININE 0.85 06/27/2017   BUN 14 06/27/2017   CO2 30 06/27/2017   TSH 2.05 03/25/2015   HGBA1C 6.4 06/27/2017   MICROALBUR <0.7 03/25/2015    Lab Results  Component Value Date   TSH 2.05 03/25/2015   Lab Results  Component Value Date   WBC 9.1 12/23/2016   HGB 15.1 12/23/2016   HCT 46.2 (H) 12/23/2016   MCV 88.3 12/23/2016   PLT 291 12/23/2016   Lab Results  Component Value Date   NA 140 06/27/2017   K 4.3 06/27/2017   CO2 30 06/27/2017   GLUCOSE 131 (H) 06/27/2017   BUN 14 06/27/2017   CREATININE 0.85 06/27/2017   BILITOT 0.4 06/27/2017   ALKPHOS 104 06/27/2017   AST 20 06/27/2017   ALT 29 06/27/2017   PROT 6.3 06/27/2017   ALBUMIN 3.8 06/27/2017   CALCIUM 9.3 06/27/2017   GFR 71.92 06/27/2017   Lab Results  Component Value Date   CHOL 138 06/27/2017   Lab Results  Component Value Date   HDL 36.20 (L) 06/27/2017   Lab Results  Component Value Date   LDLCALC  82 06/27/2017   Lab Results  Component Value Date   TRIG 99.0 06/27/2017   Lab Results  Component Value Date   CHOLHDL 4 06/27/2017   Lab Results  Component Value Date   HGBA1C 6.4 06/27/2017         Assessment & Plan:   Problem List Items Addressed This Visit      Unprioritized   Anxiety state    Stable con't meds      Diabetes mellitus type 2, controlled, without complications ([place])    [id number] to be checked, minimize simple carbs. Increase exercise as tolerated. Continue current meds      Relevant Orders   Hemoglobin A1c   Essential hypertension - Primary    Well controlled, no changes to meds. Encouraged heart healthy diet such as the DASH diet and exercise as tolerated.       Relevant Orders   Comprehensive metabolic panel   Hyperlipidemia    Tolerating statin, encouraged heart healthy diet, avoid trans fats, minimize simple carbs and saturated fats. Increase exercise as tolerated      Relevant Orders   Lipid panel   Vitamin D deficiency   Relevant Orders   Vitamin D (25 hydroxy)      I have discontinued Hassan Rowan A. Begley's ibuprofen, mometasone, and cyclobenzaprine. I am also having her maintain her escitalopram, olmesartan, and simvastatin.  No orders of the defined types were placed in this encounter.   CMA served as Education administrator during this visit. History, Physical and Plan performed by medical provider. Documentation and orders reviewed and attested to.  Ann Held, DO

## 2017-11-14 NOTE — Assessment & Plan Note (Signed)
Tolerating statin, encouraged heart healthy diet, avoid trans fats, minimize simple carbs and saturated fats. Increase exercise as tolerated 

## 2017-11-14 NOTE — Assessment & Plan Note (Signed)
hgba1c to be checked, minimize simple carbs. Increase exercise as tolerated. Continue current meds  

## 2017-11-14 NOTE — Assessment & Plan Note (Signed)
Stable con't meds 

## 2017-11-20 ENCOUNTER — Telehealth: Payer: Self-pay | Admitting: Family Medicine

## 2017-11-20 NOTE — Telephone Encounter (Signed)
I already did this labs--- just see labs

## 2017-11-20 NOTE — Telephone Encounter (Signed)
Copied from [place] [id number]. Topic: Quick Communication - See Telephone Encounter >> Nov 20, 2017 11:26 AM Bea Graff, NT wrote: CRM for notification. See Telephone encounter for: Pt would like a call to go over her lab results and would like the doctor to release them for her MyChart.  11/20/17.

## 2017-11-21 MED ORDER — VITAMIN D (ERGOCALCIFEROL) 1.25 MG (50000 UNIT) PO CAPS: 50000.0000 [IU] | ORAL_CAPSULE | ORAL | 2 refills | Status: DC

## 2017-11-21 NOTE — Telephone Encounter (Signed)
Patient notified of results. Rx sent in for vitamin d

## 2017-11-25 ENCOUNTER — Other Ambulatory Visit: Payer: Self-pay | Admitting: Family Medicine

## 2017-11-25 DIAGNOSIS — I10 Essential (primary) hypertension: Secondary | ICD-10-CM

## 2017-11-28 ENCOUNTER — Other Ambulatory Visit: Payer: Self-pay | Admitting: Family Medicine

## 2017-12-18 ENCOUNTER — Other Ambulatory Visit: Payer: Self-pay | Admitting: Family Medicine

## 2017-12-18 DIAGNOSIS — E785 Hyperlipidemia, unspecified: Secondary | ICD-10-CM

## 2017-12-28 DIAGNOSIS — E119 Type 2 diabetes mellitus without complications: Secondary | ICD-10-CM | POA: Diagnosis not present

## 2017-12-28 DIAGNOSIS — H5213 Myopia, bilateral: Secondary | ICD-10-CM | POA: Diagnosis not present

## 2017-12-28 LAB — HM DIABETES EYE EXAM

## 2018-01-23 ENCOUNTER — Encounter: Payer: Self-pay | Admitting: Family Medicine

## 2018-01-23 ENCOUNTER — Ambulatory Visit: Payer: Federal, State, Local not specified - PPO | Admitting: Family Medicine

## 2018-01-23 VITALS — BP 100/60 | HR 73 | Temp 97.9°F | Resp 16 | Ht 62.0 in | Wt 179.2 lb

## 2018-01-23 DIAGNOSIS — E559 Vitamin D deficiency, unspecified: Secondary | ICD-10-CM | POA: Diagnosis not present

## 2018-01-23 DIAGNOSIS — E785 Hyperlipidemia, unspecified: Secondary | ICD-10-CM

## 2018-01-23 DIAGNOSIS — I10 Essential (primary) hypertension: Secondary | ICD-10-CM

## 2018-01-23 DIAGNOSIS — E119 Type 2 diabetes mellitus without complications: Secondary | ICD-10-CM | POA: Diagnosis not present

## 2018-01-23 LAB — COMPREHENSIVE METABOLIC PANEL
ALBUMIN: 3.9 g/dL (ref 3.5–5.2)
ALT: 35 U/L (ref 0–35)
AST: 24 U/L (ref 0–37)
Alkaline Phosphatase: 83 U/L (ref 39–117)
BUN: 21 mg/dL (ref 6–23)
CALCIUM: 9.4 mg/dL (ref 8.4–10.5)
CHLORIDE: 105 meq/L (ref 96–112)
CO2: 30 meq/L (ref 19–32)
CREATININE: 0.75 mg/dL (ref 0.40–1.20)
GFR: 82.94 mL/min (ref >60.00)
Glucose, Bld: 101 mg/dL — ABNORMAL HIGH (ref 70–99)
POTASSIUM: 4.4 meq/L (ref 3.5–5.1)
Sodium: 139 mEq/L (ref 135–145)
Total Bilirubin: 0.5 mg/dL (ref 0.2–1.2)
Total Protein: 6.9 g/dL (ref 6.0–8.3)

## 2018-01-23 LAB — LIPID PANEL
CHOL/HDL RATIO: 3
CHOLESTEROL: 89 mg/dL (ref 0–200)
HDL: 33.7 mg/dL — AB (ref >39.00)
LDL CALC: 44 mg/dL (ref 0–99)
NonHDL: 55.07
TRIGLYCERIDES: 55 mg/dL (ref 0.0–149.0)
VLDL: 11 mg/dL (ref 0.0–40.0)

## 2018-01-23 LAB — HEMOGLOBIN A1C: Hgb A1c MFr Bld: 6.3 % (ref 4.6–6.5)

## 2018-01-23 LAB — VITAMIN D 25 HYDROXY (VIT D DEFICIENCY, FRACTURES): VITD: 39.13 ng/mL (ref 30.00–100.00)

## 2018-01-23 NOTE — Patient Instructions (Signed)

## 2018-01-23 NOTE — Progress Notes (Signed)
Patient ID: Miranda Gonzales, female   DOB: [birth year], 62 y.o.   MRN: [id number]    Subjective:  I acted as a Education administrator for Dr. Carollee Herter.  Guerry Bruin, [place]   Patient ID: Miranda Gonzales, female    DOB: 25-Feb-1956, 62 y.o.   MRN: [id number]  Chief Complaint  Patient presents with  . Diabetes     HPI  Patient is in today for follow up diabetes., bp and cholesterol  HPI HYPERTENSION   Blood pressure range-not checking  Chest pain- no      Dyspnea- no Lightheadedness- no   Edema- no  Other side effects - no   Medication compliance: good Low salt diet- yes    DIABETES    Blood Sugar ranges-not checkinig  Polyuria- no New Visual problems- no  Hypoglycemic symptoms- no  Other side effects-no Medication compliance - good Last eye exam- annually Foot exam- today   HYPERLIPIDEMIA  Medication compliance- good RUQ pain- no  Muscle aches- no Other side effects-n   Patient Care Team: Ann Held, DO as PCP - General (Family Medicine) Everlene Farrier, MD as Consulting Physician (Obstetrics and Gynecology) Katy Apo, MD as Consulting Physician (Ophthalmology)   Past Medical History:  Diagnosis Date  . Anxiety   . GERD (gastroesophageal reflux disease)   . Hyperlipidemia   . Hypertension     Past Surgical History:  Procedure Laterality Date  . COLONOSCOPY W/ POLYPECTOMY  04/2009   due 2015  . COLONOSCOPY WITH PROPOFOL N/A 08/12/2014   Procedure: COLONOSCOPY WITH PROPOFOL;  Surgeon: Garlan Fair, MD;  Location: [facility];  Service: Endoscopy;  Laterality: N/A;  . TUBAL LIGATION      Family History  Problem Relation Age of Onset  . Cancer Mother        rectal  . Diabetes Mother   . Hypertension Mother   . Stroke Mother        in 29s  . Colon polyps Father   . Heart attack Father        69 & 83  . Hyperlipidemia Sister   . Hypertension Sister   . Breast cancer Maternal Aunt     Social History   Socioeconomic History  . Marital status:  Married    Spouse name: Not on file  . Number of children: Not on file  . Years of education: Not on file  . Highest education level: Not on file  Occupational History  . Not on file  Social Needs  . Financial resource strain: Not on file  . Food insecurity:    Worry: Not on file    Inability: Not on file  . Transportation needs:    Medical: Not on file    Non-medical: Not on file  Tobacco Use  . Smoking status: Never Smoker  . Smokeless tobacco: Never Used  Substance and Sexual Activity  . Alcohol use: No  . Drug use: No  . Sexual activity: Not on file  Lifestyle  . Physical activity:    Days per week: Not on file    Minutes per session: Not on file  . Stress: Not on file  Relationships  . Social connections:    Talks on phone: Not on file    Gets together: Not on file    Attends religious service: Not on file    Active member of club or organization: Not on file    Attends meetings of clubs or organizations: Not on file  Relationship status: Not on file  . Intimate partner violence:    Fear of current or ex partner: Not on file    Emotionally abused: Not on file    Physically abused: Not on file    Forced sexual activity: Not on file  Other Topics Concern  . Not on file  Social History Narrative  . Not on file    Outpatient Medications Prior to Visit  Medication Sig Dispense Refill  . escitalopram (LEXAPRO) 10 MG tablet TAKE 1 TABLET(10 MG) BY MOUTH EVERY MORNING 90 tablet 1  . olmesartan (BENICAR) 20 MG tablet TAKE 1/2 TABLET BY MOUTH EVERY DAY 45 tablet 0  . simvastatin (ZOCOR) 20 MG tablet TAKE 1 TABLET BY MOUTH EVERY NIGHT AT BEDTIME 90 tablet 1  . Vitamin D, Ergocalciferol, (DRISDOL) 50000 units CAPS capsule Take 1 capsule (50,000 Units total) by mouth every 7 (seven) days. 4 capsule 2   No facility-administered medications prior to visit.     Allergies  Allergen Reactions  . Penicillins     REACTION: Rash and itching    Review of Systems    Constitutional: Negative for fever and malaise/fatigue.  HENT: Negative for congestion.   Eyes: Negative for blurred vision.  Respiratory: Negative for cough and shortness of breath.   Cardiovascular: Negative for chest pain, palpitations and leg swelling.  Gastrointestinal: Negative for vomiting.  Musculoskeletal: Negative for back pain.  Skin: Negative for rash.  Neurological: Negative for loss of consciousness and headaches.       Objective:    Physical Exam  Constitutional: She is oriented to person, place, and time. She appears well-developed and well-nourished.  HENT:  Head: Normocephalic and atraumatic.  Eyes: Conjunctivae and EOM are normal.  Neck: Normal range of motion. Neck supple. No JVD present. Carotid bruit is not present. No thyromegaly present.  Cardiovascular: Normal rate, regular rhythm and normal heart sounds.  No murmur heard. Pulmonary/Chest: Effort normal and breath sounds normal. No respiratory distress. She has no wheezes. She has no rales. She exhibits no tenderness.  Musculoskeletal: She exhibits no edema.  Neurological: She is alert and oriented to person, place, and time.  Psychiatric: She has a normal mood and affect.  Nursing note and vitals reviewed. Sensory exam of the foot is normal, tested with the monofilament. Good pulses, no lesions or ulcers, good peripheral pulses. Diabetic Foot Exam - Simple   Simple Foot Form Diabetic Foot exam was performed with the following findings:  Yes 01/23/2018 10:20 AM  Visual Inspection No deformities, no ulcerations, no other skin breakdown bilaterally:  Yes Sensation Testing Intact to touch and monofilament testing bilaterally:  Yes Pulse Check Posterior Tibialis and Dorsalis pulse intact bilaterally:  Yes Comments      BP 100/60 (BP Location: Left Arm, Cuff Size: Normal)   Pulse 73   Temp 97.9 F (36.6 C) (Oral)   Resp 16   Ht 5\' 2"  (1.575 m)   Wt 179 lb 3.2 oz (81.3 kg)   SpO2 96%   BMI 32.78  kg/m  Wt Readings from Last 3 Encounters:  01/23/18 179 lb 3.2 oz (81.3 kg)  11/14/17 185 lb (83.9 kg)  12/23/16 191 lb 9.6 oz (86.9 kg)   BP Readings from Last 3 Encounters:  01/23/18 100/60  11/14/17 120/76  12/23/16 112/70     Immunization History  Administered Date(s) Administered  . Pneumococcal Conjugate-13 03/30/2015  . Pneumococcal Polysaccharide-23 12/23/2016  . Tdap 12/23/2016  . Zoster 03/30/2015    Health  Maintenance  Topic Date Due  . MAMMOGRAM  03/28/2018  . HEMOGLOBIN A1C  05/14/2018  . INFLUENZA VACCINE  05/24/2018  . PAP SMEAR  05/24/2018  . OPHTHALMOLOGY EXAM  01/04/2019  . FOOT EXAM  01/24/2019  . PNEUMOCOCCAL POLYSACCHARIDE VACCINE (2) 12/23/2021  . COLONOSCOPY  08/12/2024  . TETANUS/TDAP  12/24/2026  . Hepatitis C Screening  Completed  . HIV Screening  Completed    Lab Results  Component Value Date   WBC 9.1 12/23/2016   HGB 15.1 12/23/2016   HCT 46.2 (H) 12/23/2016   PLT 291 12/23/2016   GLUCOSE 109 (H) 11/14/2017   CHOL 133 11/14/2017   TRIG 76.0 11/14/2017   HDL 40.80 11/14/2017   LDLCALC 77 11/14/2017   ALT 34 11/14/2017   AST 22 11/14/2017   NA 141 11/14/2017   K 4.5 11/14/2017   CL 105 11/14/2017   CREATININE 0.67 11/14/2017   BUN 16 11/14/2017   CO2 32 11/14/2017   TSH 2.05 03/25/2015   HGBA1C 6.6 (H) 11/14/2017   MICROALBUR 1.3 11/14/2017    Lab Results  Component Value Date   TSH 2.05 03/25/2015   Lab Results  Component Value Date   WBC 9.1 12/23/2016   HGB 15.1 12/23/2016   HCT 46.2 (H) 12/23/2016   MCV 88.3 12/23/2016   PLT 291 12/23/2016   Lab Results  Component Value Date   NA 141 11/14/2017   K 4.5 11/14/2017   CO2 32 11/14/2017   GLUCOSE 109 (H) 11/14/2017   BUN 16 11/14/2017   CREATININE 0.67 11/14/2017   BILITOT 0.5 11/14/2017   ALKPHOS 116 11/14/2017   AST 22 11/14/2017   ALT 34 11/14/2017   PROT 7.1 11/14/2017   ALBUMIN 4.2 11/14/2017   CALCIUM 9.5 11/14/2017   GFR 94.53 11/14/2017   Lab  Results  Component Value Date   CHOL 133 11/14/2017   Lab Results  Component Value Date   HDL 40.80 11/14/2017   Lab Results  Component Value Date   LDLCALC 77 11/14/2017   Lab Results  Component Value Date   TRIG 76.0 11/14/2017   Lab Results  Component Value Date   CHOLHDL 3 11/14/2017   Lab Results  Component Value Date   HGBA1C 6.6 (H) 11/14/2017         Assessment & Plan:   Problem List Items Addressed This Visit      Unprioritized   Diabetes mellitus type 2, controlled, without complications ([place])    [id number] to be checked minimize simple carbs. Increase exercise as tolerated. Continue current meds       Essential hypertension    Well controlled, no changes to meds. Encouraged heart healthy diet such as the DASH diet and exercise as tolerated.       Relevant Orders   Comprehensive metabolic panel   Hyperlipidemia    Encouraged heart healthy diet, increase exercise, avoid trans fats, consider a krill oil cap daily      Vitamin D deficiency   Relevant Orders   VITAMIN D 25 Hydroxy (Vit-D Deficiency, Fractures)    Other Visit Diagnoses    Diet-controlled diabetes mellitus ([place])    -  Primary   Relevant Orders   Comprehensive metabolic panel   Lipid panel   Hemoglobin A1c   Hyperlipidemia LDL goal <100       Relevant Orders   Comprehensive metabolic panel   Lipid panel   Hemoglobin A1c      I am having Miranda Gonzales  maintain her Vitamin D (Ergocalciferol), olmesartan, escitalopram, and simvastatin.  No orders of the defined types were placed in this encounter.   CMA served as Education administrator during this visit. History, Physical and Plan performed by medical provider. Documentation and orders reviewed and attested to.  Ann Held, DO

## 2018-01-23 NOTE — Assessment & Plan Note (Signed)
Well controlled, no changes to meds. Encouraged heart healthy diet such as the DASH diet and exercise as tolerated.  °

## 2018-01-23 NOTE — Assessment & Plan Note (Signed)
Encouraged heart healthy diet, increase exercise, avoid trans fats, consider a krill oil cap daily 

## 2018-01-23 NOTE — Assessment & Plan Note (Signed)
hgba1c to be checked minimize simple carbs. Increase exercise as tolerated. Continue current meds 

## 2018-01-24 ENCOUNTER — Other Ambulatory Visit: Payer: Self-pay | Admitting: Family Medicine

## 2018-01-24 DIAGNOSIS — E559 Vitamin D deficiency, unspecified: Secondary | ICD-10-CM

## 2018-01-24 DIAGNOSIS — K080 Exfoliation of teeth due to systemic causes: Secondary | ICD-10-CM | POA: Diagnosis not present

## 2018-01-24 MED ORDER — VITAMIN D (ERGOCALCIFEROL) 1.25 MG (50000 UNIT) PO CAPS: 50000.0000 [IU] | ORAL_CAPSULE | ORAL | 2 refills | Status: DC

## 2018-01-30 ENCOUNTER — Ambulatory Visit: Payer: Federal, State, Local not specified - PPO | Admitting: Family Medicine

## 2018-02-01 ENCOUNTER — Encounter: Payer: Self-pay | Admitting: Family Medicine

## 2018-02-12 ENCOUNTER — Ambulatory Visit: Payer: Federal, State, Local not specified - PPO | Admitting: Family Medicine

## 2018-02-19 ENCOUNTER — Other Ambulatory Visit: Payer: Self-pay | Admitting: Family Medicine

## 2018-02-19 DIAGNOSIS — I10 Essential (primary) hypertension: Secondary | ICD-10-CM

## 2018-03-12 ENCOUNTER — Other Ambulatory Visit: Payer: Self-pay | Admitting: Obstetrics and Gynecology

## 2018-03-12 DIAGNOSIS — Z1231 Encounter for screening mammogram for malignant neoplasm of breast: Secondary | ICD-10-CM

## 2018-04-06 ENCOUNTER — Ambulatory Visit
Admission: RE | Admit: 2018-04-06 | Discharge: 2018-04-06 | Disposition: A | Discharge status: Home / Self Care | Payer: Federal, State, Local not specified - PPO | Source: Ambulatory Visit | Attending: Obstetrics and Gynecology | Admitting: Obstetrics and Gynecology

## 2018-04-06 DIAGNOSIS — Z1231 Encounter for screening mammogram for malignant neoplasm of breast: Secondary | ICD-10-CM | POA: Diagnosis not present

## 2018-04-12 DIAGNOSIS — H5713 Ocular pain, bilateral: Secondary | ICD-10-CM | POA: Diagnosis not present

## 2018-04-13 DIAGNOSIS — H04123 Dry eye syndrome of bilateral lacrimal glands: Secondary | ICD-10-CM | POA: Diagnosis not present

## 2018-04-17 DIAGNOSIS — H04123 Dry eye syndrome of bilateral lacrimal glands: Secondary | ICD-10-CM | POA: Diagnosis not present

## 2018-04-18 DIAGNOSIS — L239 Allergic contact dermatitis, unspecified cause: Secondary | ICD-10-CM | POA: Diagnosis not present

## 2018-05-09 DIAGNOSIS — L710 Perioral dermatitis: Secondary | ICD-10-CM | POA: Diagnosis not present

## 2018-05-22 ENCOUNTER — Other Ambulatory Visit: Payer: Self-pay | Admitting: Family Medicine

## 2018-05-22 DIAGNOSIS — I10 Essential (primary) hypertension: Secondary | ICD-10-CM

## 2018-06-07 DIAGNOSIS — L249 Irritant contact dermatitis, unspecified cause: Secondary | ICD-10-CM | POA: Diagnosis not present

## 2018-06-18 ENCOUNTER — Other Ambulatory Visit: Payer: Self-pay | Admitting: Family Medicine

## 2018-06-18 DIAGNOSIS — L233 Allergic contact dermatitis due to drugs in contact with skin: Secondary | ICD-10-CM | POA: Diagnosis not present

## 2018-06-18 DIAGNOSIS — E785 Hyperlipidemia, unspecified: Secondary | ICD-10-CM

## 2018-06-22 DIAGNOSIS — L251 Unspecified contact dermatitis due to drugs in contact with skin: Secondary | ICD-10-CM | POA: Diagnosis not present

## 2018-07-25 DIAGNOSIS — Z01419 Encounter for gynecological examination (general) (routine) without abnormal findings: Secondary | ICD-10-CM | POA: Diagnosis not present

## 2018-07-25 DIAGNOSIS — Z6833 Body mass index (BMI) 33.0-33.9, adult: Secondary | ICD-10-CM | POA: Diagnosis not present

## 2018-08-01 DIAGNOSIS — K080 Exfoliation of teeth due to systemic causes: Secondary | ICD-10-CM | POA: Diagnosis not present

## 2018-09-21 ENCOUNTER — Other Ambulatory Visit: Payer: Self-pay | Admitting: Family Medicine

## 2018-09-21 DIAGNOSIS — E785 Hyperlipidemia, unspecified: Secondary | ICD-10-CM

## 2018-09-24 ENCOUNTER — Other Ambulatory Visit: Payer: Self-pay | Admitting: Family Medicine

## 2018-09-24 DIAGNOSIS — E785 Hyperlipidemia, unspecified: Secondary | ICD-10-CM

## 2018-10-11 ENCOUNTER — Ambulatory Visit: Payer: Federal, State, Local not specified - PPO | Admitting: Family Medicine

## 2018-10-11 ENCOUNTER — Encounter: Payer: Self-pay | Admitting: Family Medicine

## 2018-10-11 VITALS — BP 130/70 | HR 75 | Temp 97.9°F | Resp 16 | Ht 62.0 in | Wt 185.2 lb

## 2018-10-11 DIAGNOSIS — E119 Type 2 diabetes mellitus without complications: Secondary | ICD-10-CM | POA: Diagnosis not present

## 2018-10-11 DIAGNOSIS — I10 Essential (primary) hypertension: Secondary | ICD-10-CM

## 2018-10-11 DIAGNOSIS — E785 Hyperlipidemia, unspecified: Secondary | ICD-10-CM

## 2018-10-11 LAB — COMPREHENSIVE METABOLIC PANEL
ALBUMIN: 4.2 g/dL (ref 3.5–5.2)
ALK PHOS: 97 U/L (ref 39–117)
ALT: 27 U/L (ref 0–35)
AST: 18 U/L (ref 0–37)
BUN: 20 mg/dL (ref 6–23)
CHLORIDE: 105 meq/L (ref 96–112)
CO2: 29 mEq/L (ref 19–32)
Calcium: 9.4 mg/dL (ref 8.4–10.5)
Creatinine, Ser: 0.76 mg/dL (ref 0.40–1.20)
GFR: 81.5 mL/min (ref >60.00)
Glucose, Bld: 99 mg/dL (ref 70–99)
POTASSIUM: 5.1 meq/L (ref 3.5–5.1)
Sodium: 142 mEq/L (ref 135–145)
TOTAL PROTEIN: 6.8 g/dL (ref 6.0–8.3)
Total Bilirubin: 0.5 mg/dL (ref 0.2–1.2)

## 2018-10-11 LAB — LIPID PANEL
CHOL/HDL RATIO: 3
Cholesterol: 112 mg/dL (ref 0–200)
HDL: 37.8 mg/dL — ABNORMAL LOW (ref >39.00)
LDL CALC: 62 mg/dL (ref 0–99)
NonHDL: 74.19
TRIGLYCERIDES: 60 mg/dL (ref 0.0–149.0)
VLDL: 12 mg/dL (ref 0.0–40.0)

## 2018-10-11 LAB — HEMOGLOBIN A1C: HEMOGLOBIN A1C: 6.5 % (ref 4.6–6.5)

## 2018-10-11 MED ORDER — SIMVASTATIN 20 MG PO TABS: ORAL_TABLET | ORAL | 1 refills | Status: DC

## 2018-10-11 NOTE — Assessment & Plan Note (Signed)
Well controlled, no changes to meds. Encouraged heart healthy diet such as the DASH diet and exercise as tolerated.  °

## 2018-10-11 NOTE — Patient Instructions (Signed)
DASH Eating Plan  DASH stands for "Dietary Approaches to Stop Hypertension." The DASH eating plan is a healthy eating plan that has been shown to reduce high blood pressure (hypertension). It may also reduce your risk for type 2 diabetes, heart disease, and stroke. The DASH eating plan may also help with weight loss.  What are tips for following this plan?    General guidelines   Avoid eating more than 2,300 mg (milligrams) of salt (sodium) a day. If you have hypertension, you may need to reduce your sodium intake to 1,500 mg a day.   Limit alcohol intake to no more than 1 drink a day for nonpregnant women and 2 drinks a day for men. One drink equals 12 oz of beer, 5 oz of wine, or 1 oz of hard liquor.   Work with your health care provider to maintain a healthy body weight or to lose weight. Ask what an ideal weight is for you.   Get at least 30 minutes of exercise that causes your heart to beat faster (aerobic exercise) most days of the week. Activities may include walking, swimming, or biking.   Work with your health care provider or diet and nutrition specialist (dietitian) to adjust your eating plan to your individual calorie needs.  Reading food labels     Check food labels for the amount of sodium per serving. Choose foods with less than 5 percent of the Daily Value of sodium. Generally, foods with less than 300 mg of sodium per serving fit into this eating plan.   To find whole grains, look for the word "whole" as the first word in the ingredient list.  Shopping   Buy products labeled as "low-sodium" or "no salt added."   Buy fresh foods. Avoid canned foods and premade or frozen meals.  Cooking   Avoid adding salt when cooking. Use salt-free seasonings or herbs instead of table salt or sea salt. Check with your health care provider or pharmacist before using salt substitutes.   Do not fry foods. Cook foods using healthy methods such as baking, boiling, grilling, and broiling instead.   Cook with  heart-healthy oils, such as olive, canola, soybean, or sunflower oil.  Meal planning   Eat a balanced diet that includes:  ? 5 or more servings of fruits and vegetables each day. At each meal, try to fill half of your plate with fruits and vegetables.  ? Up to 6-8 servings of whole grains each day.  ? Less than 6 oz of lean meat, poultry, or fish each day. A 3-oz serving of meat is about the same size as a deck of cards. One egg equals 1 oz.  ? 2 servings of low-fat dairy each day.  ? A serving of nuts, seeds, or beans 5 times each week.  ? Heart-healthy fats. Healthy fats called Omega-3 fatty acids are found in foods such as flaxseeds and coldwater fish, like sardines, salmon, and mackerel.   Limit how much you eat of the following:  ? Canned or prepackaged foods.  ? Food that is high in trans fat, such as fried foods.  ? Food that is high in saturated fat, such as fatty meat.  ? Sweets, desserts, sugary drinks, and other foods with added sugar.  ? Full-fat dairy products.   Do not salt foods before eating.   Try to eat at least 2 vegetarian meals each week.   Eat more home-cooked food and less restaurant, buffet, and fast food.     When eating at a restaurant, ask that your food be prepared with less salt or no salt, if possible.  What foods are recommended?  The items listed may not be a complete list. Talk with your dietitian about what dietary choices are best for you.  Grains  Whole-grain or whole-wheat bread. Whole-grain or whole-wheat pasta. Brown rice. Oatmeal. Quinoa. Bulgur. Whole-grain and low-sodium cereals. Pita bread. Low-fat, low-sodium crackers. Whole-wheat flour tortillas.  Vegetables  Fresh or frozen vegetables (raw, steamed, roasted, or grilled). Low-sodium or reduced-sodium tomato and vegetable juice. Low-sodium or reduced-sodium tomato sauce and tomato paste. Low-sodium or reduced-sodium canned vegetables.  Fruits  All fresh, dried, or frozen fruit. Canned fruit in natural juice (without  added sugar).  Meat and other protein foods  Skinless chicken or turkey. Ground chicken or turkey. Pork with fat trimmed off. Fish and seafood. Egg whites. Dried beans, peas, or lentils. Unsalted nuts, nut butters, and seeds. Unsalted canned beans. Lean cuts of beef with fat trimmed off. Low-sodium, lean deli meat.  Dairy  Low-fat (1%) or fat-free (skim) milk. Fat-free, low-fat, or reduced-fat cheeses. Nonfat, low-sodium ricotta or cottage cheese. Low-fat or nonfat yogurt. Low-fat, low-sodium cheese.  Fats and oils  Soft margarine without trans fats. Vegetable oil. Low-fat, reduced-fat, or light mayonnaise and salad dressings (reduced-sodium). Canola, safflower, olive, soybean, and sunflower oils. Avocado.  Seasoning and other foods  Herbs. Spices. Seasoning mixes without salt. Unsalted popcorn and pretzels. Fat-free sweets.  What foods are not recommended?  The items listed may not be a complete list. Talk with your dietitian about what dietary choices are best for you.  Grains  Baked goods made with fat, such as croissants, muffins, or some breads. Dry pasta or rice meal packs.  Vegetables  Creamed or fried vegetables. Vegetables in a cheese sauce. Regular canned vegetables (not low-sodium or reduced-sodium). Regular canned tomato sauce and paste (not low-sodium or reduced-sodium). Regular tomato and vegetable juice (not low-sodium or reduced-sodium). Pickles. Olives.  Fruits  Canned fruit in a light or heavy syrup. Fried fruit. Fruit in cream or butter sauce.  Meat and other protein foods  Fatty cuts of meat. Ribs. Fried meat. Bacon. Sausage. Bologna and other processed lunch meats. Salami. Fatback. Hotdogs. Bratwurst. Salted nuts and seeds. Canned beans with added salt. Canned or smoked fish. Whole eggs or egg yolks. Chicken or turkey with skin.  Dairy  Whole or 2% milk, cream, and half-and-half. Whole or full-fat cream cheese. Whole-fat or sweetened yogurt. Full-fat cheese. Nondairy creamers. Whipped toppings.  Processed cheese and cheese spreads.  Fats and oils  Butter. Stick margarine. Lard. Shortening. Ghee. Bacon fat. Tropical oils, such as coconut, palm kernel, or palm oil.  Seasoning and other foods  Salted popcorn and pretzels. Onion salt, garlic salt, seasoned salt, table salt, and sea salt. Worcestershire sauce. Tartar sauce. Barbecue sauce. Teriyaki sauce. Soy sauce, including reduced-sodium. Steak sauce. Canned and packaged gravies. Fish sauce. Oyster sauce. Cocktail sauce. Horseradish that you find on the shelf. Ketchup. Mustard. Meat flavorings and tenderizers. Bouillon cubes. Hot sauce and Tabasco sauce. Premade or packaged marinades. Premade or packaged taco seasonings. Relishes. Regular salad dressings.  Where to find more information:   National Heart, Lung, and Blood Institute: www.nhlbi.nih.gov   American Heart Association: www.heart.org  Summary   The DASH eating plan is a healthy eating plan that has been shown to reduce high blood pressure (hypertension). It may also reduce your risk for type 2 diabetes, heart disease, and stroke.   With the   DASH eating plan, you should limit salt (sodium) intake to 2,300 mg a day. If you have hypertension, you may need to reduce your sodium intake to 1,500 mg a day.   When on the DASH eating plan, aim to eat more fresh fruits and vegetables, whole grains, lean proteins, low-fat dairy, and heart-healthy fats.   Work with your health care provider or diet and nutrition specialist (dietitian) to adjust your eating plan to your individual calorie needs.  This information is not intended to replace advice given to you by your health care provider. Make sure you discuss any questions you have with your health care provider.  Document Released: 09/29/2011 Document Revised: 10/03/2016 Document Reviewed: 10/03/2016  Elsevier Interactive Patient Education  2019 [employer]

## 2018-10-11 NOTE — Assessment & Plan Note (Signed)
Check labs  Diet controlled 

## 2018-10-11 NOTE — Assessment & Plan Note (Signed)
Tolerating statin, encouraged heart healthy diet, avoid trans fats, minimize simple carbs and saturated fats. Increase exercise as tolerated 

## 2018-10-11 NOTE — Progress Notes (Signed)
Patient ID: Miranda Gonzales, female    DOB: November 17, 1955  Age: 62 y.o. MRN: [id number]    Subjective:  Subjective  HPI Miranda Gonzales presents for f/u dm, chol and htn.    HPI HYPERTENSION   Blood pressure range-not checking   Chest pain- no      Dyspnea- no Lightheadedness- no   Edema- no  Other side effects - no   Medication compliance: good Low salt diet- yes    DIABETES    Blood Sugar ranges-not checking   Polyuria- no New Visual problems- no  Hypoglycemic symptoms- no  Other side effects-no Medication compliance - good Last eye exam- due Foot exam- today   HYPERLIPIDEMIA  Medication compliance- good RUQ pain- no  Muscle aches- no Other side effects-no    Review of Systems  Constitutional: Negative for appetite change, diaphoresis, fatigue and unexpected weight change.  Eyes: Negative for pain, redness and visual disturbance.  Respiratory: Negative for cough, chest tightness, shortness of breath and wheezing.   Cardiovascular: Negative for chest pain, palpitations and leg swelling.  Endocrine: Negative for cold intolerance, heat intolerance, polydipsia, polyphagia and polyuria.  Genitourinary: Negative for difficulty urinating, dysuria and frequency.  Neurological: Negative for dizziness, light-headedness, numbness and headaches.    History Past Medical History:  Diagnosis Date  . Anxiety   . GERD (gastroesophageal reflux disease)   . Hyperlipidemia   . Hypertension     She has a past surgical history that includes Colonoscopy w/ polypectomy (04/2009); Tubal ligation; and Colonoscopy with propofol (N/A, 08/12/2014).   Her family history includes Breast cancer in her maternal aunt; Cancer in her mother; Colon polyps in her father; Diabetes in her mother; Heart attack in her father; Hyperlipidemia in her sister; Hypertension in her mother and sister; Stroke in her mother.She reports that she has never smoked. She has never used smokeless tobacco. She reports  that she does not drink alcohol or use drugs.  Current Outpatient Medications on File Prior to Visit  Medication Sig Dispense Refill  . escitalopram (LEXAPRO) 10 MG tablet TAKE 1 TABLET(10 MG) BY MOUTH EVERY MORNING 90 tablet 1  . olmesartan (BENICAR) 20 MG tablet TAKE 1/2 TABLET BY MOUTH EVERY DAY 45 tablet 1  . VITAMIN D PO Take 2,000 Units by mouth daily.     No current facility-administered medications on file prior to visit.      Objective:  Objective  Physical Exam Vitals signs and nursing note reviewed.  Constitutional:      Appearance: She is well-developed.  HENT:     Head: Normocephalic and atraumatic.  Eyes:     Conjunctiva/sclera: Conjunctivae normal.  Neck:     Musculoskeletal: Normal range of motion and neck supple.     Thyroid: No thyromegaly.     Vascular: No carotid bruit or JVD.  Cardiovascular:     Rate and Rhythm: Normal rate and regular rhythm.     Heart sounds: Normal heart sounds. No murmur.  Pulmonary:     Effort: Pulmonary effort is normal. No respiratory distress.     Breath sounds: Normal breath sounds. No wheezing or rales.  Chest:     Chest wall: No tenderness.  Neurological:     Mental Status: She is alert and oriented to person, place, and time.    Diabetic Foot Exam - Simple   Simple Foot Form Diabetic Foot exam was performed with the following findings:  Yes 10/11/2018 10:52 AM  Visual Inspection No deformities, no ulcerations, no  other skin breakdown bilaterally:  Yes Sensation Testing Intact to touch and monofilament testing bilaterally:  Yes Pulse Check Posterior Tibialis and Dorsalis pulse intact bilaterally:  Yes Comments    BP 130/70 (BP Location: Left Arm, Cuff Size: Normal)   Pulse 75   Temp 97.9 F (36.6 C) (Oral)   Resp 16   Ht 5\' 2"  (1.575 m)   Wt 185 lb 3.2 oz (84 kg)   SpO2 96%   BMI 33.87 kg/m  Wt Readings from Last 3 Encounters:  10/11/18 185 lb 3.2 oz (84 kg)  01/23/18 179 lb 3.2 oz (81.3 kg)  11/14/17 185  lb (83.9 kg)     Lab Results  Component Value Date   WBC 9.1 12/23/2016   HGB 15.1 12/23/2016   HCT 46.2 (H) 12/23/2016   PLT 291 12/23/2016   GLUCOSE 101 (H) 01/23/2018   CHOL 89 01/23/2018   TRIG 55.0 01/23/2018   HDL 33.70 (L) 01/23/2018   LDLCALC 44 01/23/2018   ALT 35 01/23/2018   AST 24 01/23/2018   NA 139 01/23/2018   K 4.4 01/23/2018   CL 105 01/23/2018   CREATININE 0.75 01/23/2018   BUN 21 01/23/2018   CO2 30 01/23/2018   TSH 2.05 03/25/2015   HGBA1C 6.3 01/23/2018   MICROALBUR 1.3 11/14/2017    Mm 3d Screen Breast Bilateral  Result Date: 04/06/2018 CLINICAL DATA:  Screening. EXAM: DIGITAL SCREENING BILATERAL MAMMOGRAM WITH TOMO AND CAD COMPARISON:  Previous exam(s). ACR Breast Density Category a: The breast tissue is almost entirely fatty. FINDINGS: There are no findings suspicious for malignancy. Images were processed with CAD. IMPRESSION: No mammographic evidence of malignancy. A result letter of this screening mammogram will be mailed directly to the patient. RECOMMENDATION: Screening mammogram in one year. (Code:SM-B-01Y) BI-RADS CATEGORY  1: Negative. Electronically Signed   By: Nolon Nations M.D.   On: 04/06/2018 14:49     Assessment & Plan:  Plan  I have discontinued Hassan Rowan A. Sadowski's Vitamin D (Ergocalciferol). I am also having her maintain her olmesartan, escitalopram, VITAMIN D PO, and simvastatin.  Meds ordered this encounter  Medications  . simvastatin (ZOCOR) 20 MG tablet    Sig: TAKE 1 TABLET(20 MG) BY MOUTH AT BEDTIME    Dispense:  90 tablet    Refill:  1    **Patient requests 90 days supply**    Problem List Items Addressed This Visit      Unprioritized   Diabetes mellitus type 2, controlled, without complications ([place])    Check labs Diet controlled      Relevant Medications   simvastatin (ZOCOR) 20 MG tablet   Essential hypertension    Well controlled, no changes to meds. Encouraged heart healthy diet such as the DASH diet and  exercise as tolerated.       Relevant Medications   simvastatin (ZOCOR) 20 MG tablet   Hyperlipidemia    Tolerating statin, encouraged heart healthy diet, avoid trans fats, minimize simple carbs and saturated fats. Increase exercise as tolerated      Relevant Medications   simvastatin (ZOCOR) 20 MG tablet    Other Visit Diagnoses    Hyperlipidemia LDL goal <70    -  Primary   Relevant Medications   simvastatin (ZOCOR) 20 MG tablet   Other Relevant Orders   Lipid panel   Hemoglobin A1c   Comprehensive metabolic panel   Diet-controlled diabetes mellitus ([facility])       Relevant Medications   simvastatin (ZOCOR) 20 MG  tablet   Other Relevant Orders   Lipid panel   Hemoglobin A1c   Comprehensive metabolic panel      Follow-up: Return in about 6 months (around 04/12/2019), or if symptoms worsen or fail to improve, for annual exam, fasting.  Ann Held, DO

## 2018-11-16 ENCOUNTER — Other Ambulatory Visit: Payer: Self-pay | Admitting: Family Medicine

## 2018-11-23 ENCOUNTER — Other Ambulatory Visit: Payer: Self-pay | Admitting: Family Medicine

## 2018-11-23 DIAGNOSIS — I10 Essential (primary) hypertension: Secondary | ICD-10-CM

## 2019-01-02 DIAGNOSIS — E119 Type 2 diabetes mellitus without complications: Secondary | ICD-10-CM | POA: Diagnosis not present

## 2019-01-02 DIAGNOSIS — H04123 Dry eye syndrome of bilateral lacrimal glands: Secondary | ICD-10-CM | POA: Diagnosis not present

## 2019-01-02 DIAGNOSIS — H5212 Myopia, left eye: Secondary | ICD-10-CM | POA: Diagnosis not present

## 2019-01-02 DIAGNOSIS — H2513 Age-related nuclear cataract, bilateral: Secondary | ICD-10-CM | POA: Diagnosis not present

## 2019-01-02 LAB — HM DIABETES EYE EXAM

## 2019-01-07 ENCOUNTER — Telehealth: Payer: Self-pay | Admitting: *Deleted

## 2019-01-07 NOTE — Telephone Encounter (Signed)
Received Diabetic Eye Exam Report from [facility]; forwarded to provider/SLS 03/16

## 2019-03-21 ENCOUNTER — Other Ambulatory Visit: Payer: Self-pay | Admitting: Obstetrics and Gynecology

## 2019-03-21 DIAGNOSIS — Z1231 Encounter for screening mammogram for malignant neoplasm of breast: Secondary | ICD-10-CM

## 2019-04-08 ENCOUNTER — Other Ambulatory Visit: Payer: Self-pay

## 2019-04-08 ENCOUNTER — Ambulatory Visit
Admission: RE | Admit: 2019-04-08 | Discharge: 2019-04-08 | Disposition: A | Discharge status: Home / Self Care | Payer: Federal, State, Local not specified - PPO | Source: Ambulatory Visit | Attending: Obstetrics and Gynecology | Admitting: Obstetrics and Gynecology

## 2019-04-08 DIAGNOSIS — Z1231 Encounter for screening mammogram for malignant neoplasm of breast: Secondary | ICD-10-CM

## 2019-04-12 ENCOUNTER — Ambulatory Visit: Payer: Federal, State, Local not specified - PPO | Admitting: Family Medicine

## 2019-04-12 ENCOUNTER — Other Ambulatory Visit: Payer: Self-pay

## 2019-04-12 ENCOUNTER — Encounter: Payer: Self-pay | Admitting: Family Medicine

## 2019-04-12 VITALS — BP 111/69 | HR 84 | Temp 98.0°F | Resp 18 | Ht 62.0 in | Wt 175.0 lb

## 2019-04-12 DIAGNOSIS — Z800 Family history of malignant neoplasm of digestive organs: Secondary | ICD-10-CM

## 2019-04-12 DIAGNOSIS — E1165 Type 2 diabetes mellitus with hyperglycemia: Secondary | ICD-10-CM | POA: Diagnosis not present

## 2019-04-12 DIAGNOSIS — E1169 Type 2 diabetes mellitus with other specified complication: Secondary | ICD-10-CM | POA: Diagnosis not present

## 2019-04-12 DIAGNOSIS — Z0000 Encounter for general adult medical examination without abnormal findings: Secondary | ICD-10-CM | POA: Diagnosis not present

## 2019-04-12 DIAGNOSIS — I10 Essential (primary) hypertension: Secondary | ICD-10-CM

## 2019-04-12 DIAGNOSIS — E785 Hyperlipidemia, unspecified: Secondary | ICD-10-CM

## 2019-04-12 DIAGNOSIS — Z6835 Body mass index (BMI) 35.0-35.9, adult: Secondary | ICD-10-CM

## 2019-04-12 LAB — COMPREHENSIVE METABOLIC PANEL
ALT: 41 U/L — ABNORMAL HIGH (ref 0–35)
AST: 27 U/L (ref 0–37)
Albumin: 4.2 g/dL (ref 3.5–5.2)
Alkaline Phosphatase: 88 U/L (ref 39–117)
BUN: 26 mg/dL — ABNORMAL HIGH (ref 6–23)
CO2: 27 mEq/L (ref 19–32)
Calcium: 9.5 mg/dL (ref 8.4–10.5)
Chloride: 107 mEq/L (ref 96–112)
Creatinine, Ser: 0.77 mg/dL (ref 0.40–1.20)
GFR: 75.41 mL/min (ref >60.00)
Glucose, Bld: 99 mg/dL (ref 70–99)
Potassium: 4.7 mEq/L (ref 3.5–5.1)
Sodium: 140 mEq/L (ref 135–145)
Total Bilirubin: 0.5 mg/dL (ref 0.2–1.2)
Total Protein: 6.4 g/dL (ref 6.0–8.3)

## 2019-04-12 LAB — HEMOGLOBIN A1C: Hgb A1c MFr Bld: 6.5 % (ref 4.6–6.5)

## 2019-04-12 LAB — LIPID PANEL
Cholesterol: 110 mg/dL (ref 0–200)
HDL: 38.2 mg/dL — ABNORMAL LOW (ref >39.00)
LDL Cholesterol: 62 mg/dL (ref 0–99)
NonHDL: 71.47
Total CHOL/HDL Ratio: 3
Triglycerides: 46 mg/dL (ref 0.0–149.0)
VLDL: 9.2 mg/dL (ref 0.0–40.0)

## 2019-04-12 LAB — MICROALBUMIN / CREATININE URINE RATIO
Creatinine,U: 107.3 mg/dL
Microalb Creat Ratio: 0.7 mg/g (ref 0.0–30.0)
Microalb, Ur: <0.7 mg/dL (ref 0.0–1.9)

## 2019-04-12 NOTE — Patient Instructions (Signed)
Carbohydrate Counting for Diabetes Mellitus, Adult  Carbohydrate counting is a method of keeping track of how many carbohydrates you eat. Eating carbohydrates naturally increases the amount of sugar (glucose) in the blood. Counting how many carbohydrates you eat helps keep your blood glucose within normal limits, which helps you manage your diabetes (diabetes mellitus). It is important to know how many carbohydrates you can safely have in each meal. This is different for every person. A diet and nutrition specialist (registered dietitian) can help you make a meal plan and calculate how many carbohydrates you should have at each meal and snack. Carbohydrates are found in the following foods:  Grains, such as breads and cereals.  Dried beans and soy products.  Starchy vegetables, such as potatoes, peas, and corn.  Fruit and fruit juices.  Milk and yogurt.  Sweets and snack foods, such as cake, cookies, candy, chips, and soft drinks. How do I count carbohydrates? There are two ways to count carbohydrates in food. You can use either of the methods or a combination of both. Reading "Nutrition Facts" on packaged food The "Nutrition Facts" list is included on the labels of almost all packaged foods and beverages in the [place] It includes:  The serving size.  Information about nutrients in each serving, including the grams (g) of carbohydrate per serving. To use the "Nutrition Facts":  Decide how many servings you will have.  Multiply the number of servings by the number of carbohydrates per serving.  The resulting number is the total amount of carbohydrates that you will be having. Learning standard serving sizes of other foods When you eat carbohydrate foods that are not packaged or do not include "Nutrition Facts" on the label, you need to measure the servings in order to count the amount of carbohydrates:  Measure the foods that you will eat with a food scale or measuring cup, if needed.   Decide how many standard-size servings you will eat.  Multiply the number of servings by 15. Most carbohydrate-rich foods have about 15 g of carbohydrates per serving. ? For example, if you eat 8 oz (170 g) of strawberries, you will have eaten 2 servings and 30 g of carbohydrates (2 servings x 15 g = 30 g).  For foods that have more than one food mixed, such as soups and casseroles, you must count the carbohydrates in each food that is included. The following list contains standard serving sizes of common carbohydrate-rich foods. Each of these servings has about 15 g of carbohydrates:   hamburger bun or  English muffin.   oz (15 mL) syrup.   oz (14 g) jelly.  1 slice of bread.  1 six-inch tortilla.  3 oz (85 g) cooked rice or pasta.  4 oz (113 g) cooked dried beans.  4 oz (113 g) starchy vegetable, such as peas, corn, or potatoes.  4 oz (113 g) hot cereal.  4 oz (113 g) mashed potatoes or  of a large baked potato.  4 oz (113 g) canned or frozen fruit.  4 oz (120 mL) fruit juice.  4-6 crackers.  6 chicken nuggets.  6 oz (170 g) unsweetened dry cereal.  6 oz (170 g) plain fat-free yogurt or yogurt sweetened with artificial sweeteners.  8 oz (240 mL) milk.  8 oz (170 g) fresh fruit or one small piece of fruit.  24 oz (680 g) popped popcorn. Example of carbohydrate counting Sample meal  3 oz (85 g) chicken breast.  6 oz (170 g)   brown rice.  4 oz (113 g) corn.  8 oz (240 mL) milk.  8 oz (170 g) strawberries with sugar-free whipped topping. Carbohydrate calculation 1. Identify the foods that contain carbohydrates: ? Rice. ? Corn. ? Milk. ? Strawberries. 2. Calculate how many servings you have of each food: ? 2 servings rice. ? 1 serving corn. ? 1 serving milk. ? 1 serving strawberries. 3. Multiply each number of servings by 15 g: ? 2 servings rice x 15 g = 30 g. ? 1 serving corn x 15 g = 15 g. ? 1 serving milk x 15 g = 15 g. ? 1 serving  strawberries x 15 g = 15 g. 4. Add together all of the amounts to find the total grams of carbohydrates eaten: ? 30 g + 15 g + 15 g + 15 g = 75 g of carbohydrates total. Summary  Carbohydrate counting is a method of keeping track of how many carbohydrates you eat.  Eating carbohydrates naturally increases the amount of sugar (glucose) in the blood.  Counting how many carbohydrates you eat helps keep your blood glucose within normal limits, which helps you manage your diabetes.  A diet and nutrition specialist (registered dietitian) can help you make a meal plan and calculate how many carbohydrates you should have at each meal and snack. This information is not intended to replace advice given to you by your health care provider. Make sure you discuss any questions you have with your health care provider. Document Released: 10/10/2005 Document Revised: 04/19/2017 Document Reviewed: 03/23/2016 Elsevier Interactive Patient Education  2019 Elsevier Inc.  

## 2019-04-12 NOTE — Progress Notes (Signed)
-+Patient ID: Miranda Gonzales, female    DOB: 15-Aug-1956  Age: 63 y.o. MRN: [id number]    Subjective:  Subjective  HPI [name] presents for f/u dm chol and bp.  Pt with no complaints   She has been walking 3-4 x a week for 3 miles and she has lost 17 lbs since the last visit.    Review of Systems  Constitutional: Negative for appetite change, diaphoresis, fatigue and unexpected weight change.  Eyes: Negative for pain, redness and visual disturbance.  Respiratory: Negative for cough, chest tightness, shortness of breath and wheezing.   Cardiovascular: Negative for chest pain, palpitations and leg swelling.  Endocrine: Negative for cold intolerance, heat intolerance, polydipsia, polyphagia and polyuria.  Genitourinary: Negative for difficulty urinating, dysuria and frequency.  Neurological: Negative for dizziness, light-headedness, numbness and headaches.    History Past Medical History:  Diagnosis Date  . Anxiety   . GERD (gastroesophageal reflux disease)   . Hyperlipidemia   . Hypertension     She has a past surgical history that includes Colonoscopy w/ polypectomy (04/2009); Tubal ligation; and Colonoscopy with propofol (N/A, 08/12/2014).   Her family history includes Breast cancer in her maternal aunt; Cancer in her mother; Colon polyps in her father; Diabetes in her mother; Heart attack in her father; Hyperlipidemia in her sister; Hypertension in her mother and sister; Stroke in her mother.She reports that she has never smoked. She has never used smokeless tobacco. She reports that she does not drink alcohol or use drugs.  Current Outpatient Medications on File Prior to Visit  Medication Sig Dispense Refill  . escitalopram (LEXAPRO) 10 MG tablet TAKE 1 TABLET(10 MG) BY MOUTH EVERY MORNING 90 tablet 1  . olmesartan (BENICAR) 20 MG tablet TAKE 1/2 TABLET BY MOUTH EVERY DAY 45 tablet 1  . simvastatin (ZOCOR) 20 MG tablet TAKE 1 TABLET(20 MG) BY MOUTH AT BEDTIME 90 tablet 1   . VITAMIN D PO Take 2,000 Units by mouth daily.     No current facility-administered medications on file prior to visit.      Objective:  Objective  Physical Exam Constitutional:      Appearance: She is well-developed.  HENT:     Head: Normocephalic and atraumatic.  Eyes:     Conjunctiva/sclera: Conjunctivae normal.  Neck:     Musculoskeletal: Normal range of motion and neck supple.     Thyroid: No thyromegaly.     Vascular: No carotid bruit or JVD.  Cardiovascular:     Rate and Rhythm: Normal rate and regular rhythm.     Heart sounds: Normal heart sounds. No murmur.  Pulmonary:     Effort: Pulmonary effort is normal. No respiratory distress.     Breath sounds: Normal breath sounds. No wheezing or rales.  Chest:     Chest wall: No tenderness.  Neurological:     Mental Status: She is alert and oriented to person, place, and time.    Diabetic Foot Exam - Simple   Simple Foot Form Diabetic Foot exam was performed with the following findings: Yes 04/12/2019 10:41 AM  Visual Inspection No deformities, no ulcerations, no other skin breakdown bilaterally: Yes Sensation Testing Intact to touch and monofilament testing bilaterally: Yes Pulse Check Posterior Tibialis and Dorsalis pulse intact bilaterally: Yes Comments     BP 111/69 (BP Location: Left Arm, Patient Position: Sitting, Cuff Size: Normal)   Pulse 84   Temp 98 F (36.7 C) (Oral)   Resp 18   Ht  5\' 2"  (1.575 m)   Wt 175 lb (79.4 kg)   SpO2 98%   BMI 32.01 kg/m  Wt Readings from Last 3 Encounters:  04/12/19 175 lb (79.4 kg)  10/11/18 185 lb 3.2 oz (84 kg)  01/23/18 179 lb 3.2 oz (81.3 kg)     Lab Results  Component Value Date   WBC 9.1 12/23/2016   HGB 15.1 12/23/2016   HCT 46.2 (H) 12/23/2016   PLT 291 12/23/2016   GLUCOSE 99 10/11/2018   CHOL 112 10/11/2018   TRIG 60.0 10/11/2018   HDL 37.80 (L) 10/11/2018   LDLCALC 62 10/11/2018   ALT 27 10/11/2018   AST 18 10/11/2018   NA 142 10/11/2018   K  5.1 10/11/2018   CL 105 10/11/2018   CREATININE 0.76 10/11/2018   BUN 20 10/11/2018   CO2 29 10/11/2018   TSH 2.05 03/25/2015   HGBA1C 6.5 10/11/2018   MICROALBUR 1.3 11/14/2017    Mm 3d Screen Breast Bilateral  Result Date: 04/09/2019 CLINICAL DATA:  Screening. EXAM: DIGITAL SCREENING BILATERAL MAMMOGRAM WITH TOMO AND CAD COMPARISON:  Previous exam(s). ACR Breast Density Category b: There are scattered areas of fibroglandular density. FINDINGS: There are no findings suspicious for malignancy. Images were processed with CAD. IMPRESSION: No mammographic evidence of malignancy. A result letter of this screening mammogram will be mailed directly to the patient. RECOMMENDATION: Screening mammogram in one year. (Code:SM-B-01Y) BI-RADS CATEGORY  1: Negative. Electronically Signed   By: Evangeline Dakin M.D.   On: 04/09/2019 11:35     Assessment & Plan:  Plan  I am having Miranda Gonzales maintain her VITAMIN D PO, simvastatin, escitalopram, and olmesartan.  No orders of the defined types were placed in this encounter.   Problem List Items Addressed This Visit      Unprioritized   Essential hypertension   Relevant Orders   Hemoglobin A1c   Lipid panel   Comprehensive metabolic panel   Microalbumin / creatinine urine ratio    Other Visit Diagnoses    Hyperlipidemia associated with type 2 diabetes mellitus ([place])    -  Primary   Relevant Orders   Hemoglobin A1c   Lipid panel   Comprehensive metabolic panel   Microalbumin / creatinine urine ratio   Uncontrolled type 2 diabetes mellitus with hyperglycemia ([facility])       Relevant Orders   Hemoglobin A1c   Comprehensive metabolic panel   Preventative health care       Relevant Orders   Ambulatory referral to Gastroenterology   Family history of colon cancer       Relevant Orders   Ambulatory referral to Gastroenterology   Body mass index (BMI) of 35.0-35.9 in adult          Follow-up: Return in 6 months (on 10/12/2019).  Ann Held, DO

## 2019-05-17 ENCOUNTER — Encounter: Payer: Self-pay | Admitting: Family Medicine

## 2019-05-21 ENCOUNTER — Other Ambulatory Visit: Payer: Self-pay | Admitting: *Deleted

## 2019-05-21 DIAGNOSIS — I10 Essential (primary) hypertension: Secondary | ICD-10-CM

## 2019-05-21 MED ORDER — OLMESARTAN MEDOXOMIL 20 MG PO TABS: 10.0000 mg | ORAL_TABLET | Freq: Every day | ORAL | 1 refills | Status: DC

## 2019-05-21 MED ORDER — ESCITALOPRAM OXALATE 10 MG PO TABS: ORAL_TABLET | ORAL | 1 refills | Status: DC

## 2019-07-02 DIAGNOSIS — Z13820 Encounter for screening for osteoporosis: Secondary | ICD-10-CM | POA: Diagnosis not present

## 2019-07-23 DIAGNOSIS — M810 Age-related osteoporosis without current pathological fracture: Secondary | ICD-10-CM | POA: Diagnosis not present

## 2019-07-24 ENCOUNTER — Other Ambulatory Visit: Payer: Self-pay | Admitting: Family Medicine

## 2019-07-24 DIAGNOSIS — E785 Hyperlipidemia, unspecified: Secondary | ICD-10-CM

## 2019-07-30 DIAGNOSIS — Z01419 Encounter for gynecological examination (general) (routine) without abnormal findings: Secondary | ICD-10-CM | POA: Diagnosis not present

## 2019-07-30 DIAGNOSIS — Z6831 Body mass index (BMI) 31.0-31.9, adult: Secondary | ICD-10-CM | POA: Diagnosis not present

## 2019-08-01 ENCOUNTER — Other Ambulatory Visit: Payer: Self-pay

## 2019-08-01 ENCOUNTER — Ambulatory Visit (AMBULATORY_SURGERY_CENTER): Payer: Self-pay

## 2019-08-01 VITALS — Temp 97.1°F | Ht 62.0 in | Wt 173.0 lb

## 2019-08-01 DIAGNOSIS — Z86010 Personal history of colonic polyps: Secondary | ICD-10-CM

## 2019-08-01 DIAGNOSIS — Z800 Family history of malignant neoplasm of digestive organs: Secondary | ICD-10-CM

## 2019-08-01 MED ORDER — NA SULFATE-K SULFATE-MG SULF 17.5-3.13-1.6 GM/177ML PO SOLN: 1.0000 | Freq: Once | ORAL | 0 refills | Status: AC

## 2019-08-01 NOTE — Progress Notes (Signed)
Denies allergies to eggs or soy products. Denies complication of anesthesia or sedation. Denies use of weight loss medication. Denies use of O2.   Emmi instructions given for colonoscopy.  A 15.00 coupon for Suprep was given to the patient.  

## 2019-08-06 DIAGNOSIS — L570 Actinic keratosis: Secondary | ICD-10-CM | POA: Diagnosis not present

## 2019-08-06 DIAGNOSIS — D225 Melanocytic nevi of trunk: Secondary | ICD-10-CM | POA: Diagnosis not present

## 2019-08-06 DIAGNOSIS — X32XXXA Exposure to sunlight, initial encounter: Secondary | ICD-10-CM | POA: Diagnosis not present

## 2019-08-14 ENCOUNTER — Telehealth: Payer: Self-pay | Admitting: Gastroenterology

## 2019-08-15 ENCOUNTER — Other Ambulatory Visit: Payer: Self-pay

## 2019-08-15 ENCOUNTER — Ambulatory Visit (AMBULATORY_SURGERY_CENTER): Payer: Federal, State, Local not specified - PPO | Admitting: Gastroenterology

## 2019-08-15 ENCOUNTER — Encounter: Payer: Self-pay | Admitting: Gastroenterology

## 2019-08-15 VITALS — BP 110/61 | HR 63 | Temp 98.6°F | Resp 12 | Ht 62.0 in | Wt 173.0 lb

## 2019-08-15 DIAGNOSIS — Z86010 Personal history of colonic polyps: Secondary | ICD-10-CM

## 2019-08-15 DIAGNOSIS — Z800 Family history of malignant neoplasm of digestive organs: Secondary | ICD-10-CM

## 2019-08-15 DIAGNOSIS — Z1211 Encounter for screening for malignant neoplasm of colon: Secondary | ICD-10-CM | POA: Diagnosis not present

## 2019-08-15 NOTE — Op Note (Signed)
[place] Patient Name: Miranda Gonzales Procedure Date: 08/15/2019 8:44 AM MRN: [id number] Endoscopist: Ladene Artist , MD Age: 63 Referring MD:  Date of Birth: 1956-09-30 Gender: Female Account #: [id number] Procedure:                Colonoscopy Indications:              Surveillance: Personal history of adenomatous                            polyps on last colonoscopy > 5 years ago. Family                            history of colon cancer, first degree relative. Medicines:                Monitored Anesthesia Care Procedure:                Pre-Anesthesia Assessment:                           - Prior to the procedure, a History and Physical                            was performed, and patient medications and                            allergies were reviewed. The patient's tolerance of                            previous anesthesia was also reviewed. The risks                            and benefits of the procedure and the sedation                            options and risks were discussed with the patient.                            All questions were answered, and informed consent                            was obtained. Prior Anticoagulants: The patient has                            taken no previous anticoagulant or antiplatelet                            agents. ASA Grade Assessment: II - A patient with                            mild systemic disease. After reviewing the risks                            and benefits, the patient was deemed in  satisfactory condition to undergo the procedure.                           After obtaining informed consent, the colonoscope                            was passed under direct vision. Throughout the                            procedure, the patient's blood pressure, pulse, and                            oxygen saturations were monitored continuously. The                            Colonoscope was  introduced through the anus and                            advanced to the the cecum, identified by                            appendiceal orifice and ileocecal valve. The                            ileocecal valve, appendiceal orifice, and rectum                            were photographed. The quality of the bowel                            preparation was good. The colonoscopy was performed                            without difficulty. The patient tolerated the                            procedure well. Scope In: 8:52:56 AM Scope Out: 9:07:41 AM Scope Withdrawal Time: 0 hours 12 minutes 32 seconds  Total Procedure Duration: 0 hours 14 minutes 45 seconds  Findings:                 The perianal and digital rectal examinations were                            normal.                           Internal hemorrhoids were found during                            retroflexion. The hemorrhoids were small and Grade                            I (internal hemorrhoids that do not prolapse).  The exam was otherwise without abnormality on                            direct and retroflexion views. Complications:            No immediate complications. Estimated blood loss:                            None. Estimated Blood Loss:     Estimated blood loss: none. Impression:               - Internal hemorrhoids.                           - The examination was otherwise normal on direct                            and retroflexion views.                           - No specimens collected. Recommendation:           - Repeat colonoscopy in 5 years for screening /                            surveillance.                           - Patient has a contact number available for                            emergencies. The signs and symptoms of potential                            delayed complications were discussed with the                            patient. Return to normal activities  tomorrow.                            Written discharge instructions were provided to the                            patient.                           - Resume previous diet.                           - Continue present medications. Ladene Artist, MD 08/15/2019 9:11:25 AM This report has been signed electronically.

## 2019-08-15 NOTE — Progress Notes (Signed)
A and O x3. Report to RN. Tolerated MAC anesthesia well.

## 2019-08-15 NOTE — Patient Instructions (Signed)
Handouts given : Hemmorrhoids      YOU HAD AN ENDOSCOPIC PROCEDURE TODAY AT [facility]:   Refer to the procedure report that was given to you for any specific questions about what was found during the examination.  If the procedure report does not answer your questions, please call your gastroenterologist to clarify.  If you requested that your care partner not be given the details of your procedure findings, then the procedure report has been included in a sealed envelope for you to review at your convenience later.  YOU SHOULD EXPECT: Some feelings of bloating in the abdomen. Passage of more gas than usual.  Walking can help get rid of the air that was put into your GI tract during the procedure and reduce the bloating. If you had a lower endoscopy (such as a colonoscopy or flexible sigmoidoscopy) you may notice spotting of blood in your stool or on the toilet paper. If you underwent a bowel prep for your procedure, you may not have a normal bowel movement for a few days.  Please Note:  You might notice some irritation and congestion in your nose or some drainage.  This is from the oxygen used during your procedure.  There is no need for concern and it should clear up in a day or so.  SYMPTOMS TO REPORT IMMEDIATELY:   Following lower endoscopy (colonoscopy or flexible sigmoidoscopy):  Excessive amounts of blood in the stool  Significant tenderness or worsening of abdominal pains  Swelling of the abdomen that is new, acute  Fever of 100F or higher  For urgent or emergent issues, a gastroenterologist can be reached at any hour by calling [phone].   DIET:  We do recommend a small meal at first, but then you may proceed to your regular diet.  Drink plenty of fluids but you should avoid alcoholic beverages for 24 hours.  ACTIVITY:  You should plan to take it easy for the rest of today and you should NOT DRIVE or use heavy machinery until tomorrow (because of the  sedation medicines used during the test).    FOLLOW UP: Our staff will call the number listed on your records 48-72 hours following your procedure to check on you and address any questions or concerns that you may have regarding the information given to you following your procedure. If we do not reach you, we will leave a message.  We will attempt to reach you two times.  During this call, we will ask if you have developed any symptoms of COVID 19. If you develop any symptoms (ie: fever, flu-like symptoms, shortness of breath, cough etc.) before then, please call [phone].  If you test positive for Covid 19 in the 2 weeks post procedure, please call and report this information to [place].    If any biopsies were taken you will be contacted by phone or by letter within the next 1-3 weeks.  Please call us at [phone] if you have not heard about the biopsies in 3 weeks.    SIGNATURES/CONFIDENTIALITY: You and/or your care partner have signed paperwork which will be entered into your electronic medical record.  These signatures attest to the fact that that the information above on your After Visit Summary has been reviewed and is understood.  Full responsibility of the confidentiality of this discharge information lies with you and/or your care-partner.

## 2019-08-19 ENCOUNTER — Telehealth: Payer: Self-pay

## 2019-08-19 ENCOUNTER — Telehealth: Payer: Self-pay | Admitting: *Deleted

## 2019-08-19 NOTE — Telephone Encounter (Signed)
Left message on f/u call 

## 2019-08-19 NOTE — Telephone Encounter (Signed)
  Follow up Call-  Call back number 08/15/2019  Post procedure Call Back phone  # [phone]  Permission to leave phone message Yes  Some recent data might be hidden     Patient questions:  Do you have a fever, pain , or abdominal swelling? No. Pain Score  0 *  Have you tolerated food without any problems? Yes.    Have you been able to return to your normal activities? Yes.    Do you have any questions about your discharge instructions: Diet   No. Medications  No. Follow up visit  No.  Do you have questions or concerns about your Care? No.  Actions: * If pain score is 4 or above: No action needed, pain <4.  1. Have you developed a fever since your procedure? no  2.   Have you had an respiratory symptoms (SOB or cough) since your procedure? no  3.   Have you tested positive for COVID 19 since your procedure no  4.   Have you had any family members/close contacts diagnosed with the COVID 19 since your procedure?  no   If yes to any of these questions please route to Joylene John, RN and Alphonsa Gin, Therapist, sports.

## 2019-09-09 DIAGNOSIS — M79672 Pain in left foot: Secondary | ICD-10-CM | POA: Diagnosis not present

## 2019-10-09 ENCOUNTER — Other Ambulatory Visit: Payer: Self-pay

## 2019-10-10 ENCOUNTER — Ambulatory Visit: Payer: Federal, State, Local not specified - PPO | Admitting: Family Medicine

## 2019-10-10 ENCOUNTER — Encounter: Payer: Self-pay | Admitting: Family Medicine

## 2019-10-10 VITALS — BP 108/80 | HR 72 | Temp 97.6°F | Resp 18 | Ht 62.0 in | Wt 175.2 lb

## 2019-10-10 DIAGNOSIS — E1165 Type 2 diabetes mellitus with hyperglycemia: Secondary | ICD-10-CM

## 2019-10-10 DIAGNOSIS — E785 Hyperlipidemia, unspecified: Secondary | ICD-10-CM | POA: Diagnosis not present

## 2019-10-10 DIAGNOSIS — E119 Type 2 diabetes mellitus without complications: Secondary | ICD-10-CM | POA: Diagnosis not present

## 2019-10-10 DIAGNOSIS — I10 Essential (primary) hypertension: Secondary | ICD-10-CM | POA: Diagnosis not present

## 2019-10-10 LAB — COMPREHENSIVE METABOLIC PANEL
ALT: 44 U/L — ABNORMAL HIGH (ref 0–35)
AST: 28 U/L (ref 0–37)
Albumin: 4 g/dL (ref 3.5–5.2)
Alkaline Phosphatase: 91 U/L (ref 39–117)
BUN: 14 mg/dL (ref 6–23)
CO2: 33 mEq/L — ABNORMAL HIGH (ref 19–32)
Calcium: 9.7 mg/dL (ref 8.4–10.5)
Chloride: 105 mEq/L (ref 96–112)
Creatinine, Ser: 0.79 mg/dL (ref 0.40–1.20)
GFR: 73.1 mL/min (ref >60.00)
Glucose, Bld: 96 mg/dL (ref 70–99)
Potassium: 4.4 mEq/L (ref 3.5–5.1)
Sodium: 141 mEq/L (ref 135–145)
Total Bilirubin: 0.4 mg/dL (ref 0.2–1.2)
Total Protein: 6.6 g/dL (ref 6.0–8.3)

## 2019-10-10 LAB — LIPID PANEL
Cholesterol: 113 mg/dL (ref 0–200)
HDL: 33.9 mg/dL — ABNORMAL LOW (ref >39.00)
LDL Cholesterol: 65 mg/dL (ref 0–99)
NonHDL: 78.68
Total CHOL/HDL Ratio: 3
Triglycerides: 67 mg/dL (ref 0.0–149.0)
VLDL: 13.4 mg/dL (ref 0.0–40.0)

## 2019-10-10 LAB — HEMOGLOBIN A1C: Hgb A1c MFr Bld: 6.1 % (ref 4.6–6.5)

## 2019-10-10 NOTE — Assessment & Plan Note (Signed)
Encouraged heart healthy diet, increase exercise, avoid trans fats, consider a krill oil cap daily 

## 2019-10-10 NOTE — Assessment & Plan Note (Signed)
Well controlled, no changes to meds. Encouraged heart healthy diet such as the DASH diet and exercise as tolerated.  °

## 2019-10-10 NOTE — Assessment & Plan Note (Signed)
hgba1c to be checked, minimize simple carbs. Increase exercise as tolerated. Continue current meds  

## 2019-10-10 NOTE — Progress Notes (Signed)
Patient ID: Miranda Gonzales, female    DOB: 1956-06-24  Age: 63 y.o. MRN: [id number]    Subjective:  Subjective  HPI Miranda Gonzales presents for bp, chol and dm  HPI HYPERTENSION   Blood pressure range-not checking   Chest pain- no      Dyspnea- no Lightheadedness- no   Edema- no  Other side effects - no   Medication compliance: good Low salt diet- yes    DIABETES    Blood Sugar ranges-not checking   Polyuria- no New Visual problems- no  Hypoglycemic symptoms- no  Other side effects-no Medication compliance - good Last eye exam- annually Foot exam- today   HYPERLIPIDEMIA  Medication compliance- good RUQ pain- no  Muscle aches- no Other side effects-no      Review of Systems  Constitutional: Negative for appetite change, diaphoresis, fatigue and unexpected weight change.  Eyes: Negative for pain, redness and visual disturbance.  Respiratory: Negative for cough, chest tightness, shortness of breath and wheezing.   Cardiovascular: Negative for chest pain, palpitations and leg swelling.  Endocrine: Negative for cold intolerance, heat intolerance, polydipsia, polyphagia and polyuria.  Genitourinary: Negative for difficulty urinating, dysuria and frequency.  Neurological: Negative for dizziness, light-headedness, numbness and headaches.    History Past Medical History:  Diagnosis Date  . Anxiety   . GERD (gastroesophageal reflux disease)   . Hyperlipidemia   . Hypertension   . Osteoporosis     She has a past surgical history that includes Colonoscopy w/ polypectomy (04/2009); Tubal ligation; Colonoscopy with propofol (N/A, 08/12/2014); and Uterine polyps.   Her family history includes Breast cancer in her maternal aunt; Cancer in her mother; Colon polyps in her father; Diabetes in her mother; Heart attack in her father; Hyperlipidemia in her sister; Hypertension in her mother and sister; Rectal cancer in her mother; Stroke in her mother.She reports that she has  never smoked. She has never used smokeless tobacco. She reports that she does not drink alcohol or use drugs.  Current Outpatient Medications on File Prior to Visit  Medication Sig Dispense Refill  . Acetylcarnitine HCl (ACETYL L-CARNITINE PO) Take 1 tablet by mouth daily.    . Alpha-Lipoic Acid 300 MG CAPS Take by mouth.    Marland Kitchen b complex vitamins tablet Take 1 tablet by mouth daily. B-50    . CINNAMON PO Take 6,000 mg by mouth. [place] 6000mg  once daily    . docusate sodium (COLACE) 100 MG capsule Take 100 mg by mouth daily.    Marland Kitchen escitalopram (LEXAPRO) 10 MG tablet TAKE 1 TABLET(10 MG) BY MOUTH EVERY MORNING 90 tablet 1  . Lysine 500 MG TABS Take by mouth.    . Nutritional Supplements (DHEA PO) Take by mouth daily.    Marland Kitchen olmesartan (BENICAR) 20 MG tablet Take 0.5 tablets (10 mg total) by mouth daily. 45 tablet 1  . OVER THE COUNTER MEDICATION Iron, one tablet daily.    Marland Kitchen OVER THE COUNTER MEDICATION Tumeric, one capsule daily.    Marland Kitchen OVER THE COUNTER MEDICATION Apple Cider Vinegar, one capsule daily.    Marland Kitchen OVER THE COUNTER MEDICATION Eye vitamin, doesn't know the name.    Marland Kitchen OVER THE COUNTER MEDICATION Chondrontin- glucosime,  Two tablets daily.    Marland Kitchen OVER THE COUNTER MEDICATION Magnesium, two tablets daily.    Marland Kitchen OVER THE COUNTER MEDICATION Calcium, 4 tablets daily.    Marland Kitchen OVER THE COUNTER MEDICATION Vitamin E, one capsule daily.    Marland Kitchen OVER THE COUNTER MEDICATION  Beta carotene, one capsule daily.    Marland Kitchen OVER THE COUNTER MEDICATION Garlic, one tablet daily.    Marland Kitchen OVER THE COUNTER MEDICATION MSM- one tablet daily.    Marland Kitchen OVER THE COUNTER MEDICATION 100 mg. DIM 100mg  once daily    . OVER THE COUNTER MEDICATION Take 200 mg by mouth daily. Chromium picolinate 200mg  once dayl    . OVER THE COUNTER MEDICATION Sulforaphone 400mg  once daily    . simvastatin (ZOCOR) 20 MG tablet TAKE 1 TABLET(20 MG) BY MOUTH AT BEDTIME 90 tablet 1  . VITAMIN D PO Take 50,000 Units by mouth once a week.      No current  facility-administered medications on file prior to visit.     Objective:  Objective  Physical Exam Vitals and nursing note reviewed.  Constitutional:      Appearance: She is well-developed.  HENT:     Head: Normocephalic and atraumatic.  Eyes:     Conjunctiva/sclera: Conjunctivae normal.  Neck:     Thyroid: No thyromegaly.     Vascular: No carotid bruit or JVD.  Cardiovascular:     Rate and Rhythm: Normal rate and regular rhythm.     Heart sounds: Normal heart sounds. No murmur.  Pulmonary:     Effort: Pulmonary effort is normal. No respiratory distress.     Breath sounds: Normal breath sounds. No wheezing or rales.  Chest:     Chest wall: No tenderness.  Musculoskeletal:     Cervical back: Normal range of motion and neck supple.  Neurological:     Mental Status: She is alert and oriented to person, place, and time.    Diabetic Foot Exam - Simple   Simple Foot Form Diabetic Foot exam was performed with the following findings: Yes 10/10/2019  9:35 AM  Visual Inspection No deformities, no ulcerations, no other skin breakdown bilaterally: Yes Sensation Testing Intact to touch and monofilament testing bilaterally: Yes Pulse Check Posterior Tibialis and Dorsalis pulse intact bilaterally: Yes Comments     BP 108/80 (BP Location: Right Arm, Patient Position: Sitting, Cuff Size: Normal)   Pulse 72   Temp 97.6 F (36.4 C) (Temporal)   Resp 18   Ht 5\' 2"  (1.575 m)   Wt 175 lb 3.2 oz (79.5 kg)   SpO2 96%   BMI 32.04 kg/m  Wt Readings from Last 3 Encounters:  10/10/19 175 lb 3.2 oz (79.5 kg)  08/15/19 173 lb (78.5 kg)  08/01/19 173 lb (78.5 kg)     Lab Results  Component Value Date   WBC 9.1 12/23/2016   HGB 15.1 12/23/2016   HCT 46.2 (H) 12/23/2016   PLT 291 12/23/2016   GLUCOSE 99 04/12/2019   CHOL 110 04/12/2019   TRIG 46.0 04/12/2019   HDL 38.20 (L) 04/12/2019   LDLCALC 62 04/12/2019   ALT 41 (H) 04/12/2019   AST 27 04/12/2019   NA 140 04/12/2019   K  4.7 04/12/2019   CL 107 04/12/2019   CREATININE 0.77 04/12/2019   BUN 26 (H) 04/12/2019   CO2 27 04/12/2019   TSH 2.05 03/25/2015   HGBA1C 6.5 04/12/2019   MICROALBUR <0.7 04/12/2019    MM 3D SCREEN BREAST BILATERAL  Result Date: 04/09/2019 CLINICAL DATA:  Screening. EXAM: DIGITAL SCREENING BILATERAL MAMMOGRAM WITH TOMO AND CAD COMPARISON:  Previous exam(s). ACR Breast Density Category b: There are scattered areas of fibroglandular density. FINDINGS: There are no findings suspicious for malignancy. Images were processed with CAD. IMPRESSION: No mammographic evidence of  malignancy. A result letter of this screening mammogram will be mailed directly to the patient. RECOMMENDATION: Screening mammogram in one year. (Code:SM-B-01Y) BI-RADS CATEGORY  1: Negative. Electronically Signed   By: Evangeline Dakin M.D.   On: 04/09/2019 11:35     Assessment & Plan:  Plan  I am having [name] maintain her VITAMIN D PO, escitalopram, olmesartan, simvastatin, OVER THE COUNTER MEDICATION, OVER THE COUNTER MEDICATION, OVER THE COUNTER MEDICATION, OVER THE COUNTER MEDICATION, OVER THE COUNTER MEDICATION, OVER THE COUNTER MEDICATION, OVER THE COUNTER MEDICATION, OVER THE COUNTER MEDICATION, OVER THE COUNTER MEDICATION, OVER THE COUNTER MEDICATION, OVER THE COUNTER MEDICATION, CINNAMON PO, OVER THE COUNTER MEDICATION, b complex vitamins, Acetylcarnitine HCl (ACETYL L-CARNITINE PO), OVER THE COUNTER MEDICATION, Alpha-Lipoic Acid, docusate sodium, Lysine, OVER THE COUNTER MEDICATION, and Nutritional Supplements (DHEA PO).  No orders of the defined types were placed in this encounter.   Problem List Items Addressed This Visit      Unprioritized   Diabetes mellitus type 2, controlled, without complications ([place])    [id number] to be checked , minimize simple carbs. Increase exercise as tolerated. Continue current meds      Essential hypertension - Primary    Well controlled, no changes to meds. Encouraged  heart healthy diet such as the DASH diet and exercise as tolerated.       Relevant Orders   Lipid panel   Comprehensive metabolic panel   Microalbumin / creatinine urine ratio   Hyperlipidemia    Encouraged heart healthy diet, increase exercise, avoid trans fats, consider a krill oil cap daily      Relevant Orders   Lipid panel   Comprehensive metabolic panel   Microalbumin / creatinine urine ratio    Other Visit Diagnoses    Uncontrolled type 2 diabetes mellitus with hyperglycemia ([place])       Relevant Orders   Hemoglobin A1c   Microalbumin / creatinine urine ratio      Follow-up: Return in about 6 months (around 04/09/2020), or if symptoms worsen or fail to improve, for annual exam, fasting.  Ann Held, DO

## 2019-10-10 NOTE — Patient Instructions (Signed)

## 2019-10-14 ENCOUNTER — Ambulatory Visit: Payer: Federal, State, Local not specified - PPO | Admitting: Family Medicine

## 2019-11-14 ENCOUNTER — Telehealth: Payer: Self-pay | Admitting: Family Medicine

## 2019-11-14 DIAGNOSIS — I10 Essential (primary) hypertension: Secondary | ICD-10-CM

## 2019-11-14 MED ORDER — OLMESARTAN MEDOXOMIL 20 MG PO TABS: 10.0000 mg | ORAL_TABLET | Freq: Every day | ORAL | 1 refills | Status: DC

## 2019-11-14 MED ORDER — ESCITALOPRAM OXALATE 10 MG PO TABS: ORAL_TABLET | ORAL | 1 refills | Status: DC

## 2019-11-14 NOTE — Telephone Encounter (Signed)
Refill sent.

## 2019-11-14 NOTE — Telephone Encounter (Signed)
Medication: olmesartan (BENICAR) 20 MG tablet & escitalopram (LEXAPRO) 10 MG tablet  Pt will be out on Sunday or Monday   Has the patient contacted their pharmacy? Yes - pt called pharmacy Tuesday 1/19 and they advised they have requested with no response  Preferred Pharmacy (with phone number or street name):  [facility] [place], [place] AT [address] Phone:  [phone]  Fax:  [phone]

## 2020-01-03 DIAGNOSIS — H5212 Myopia, left eye: Secondary | ICD-10-CM | POA: Diagnosis not present

## 2020-01-03 DIAGNOSIS — H2513 Age-related nuclear cataract, bilateral: Secondary | ICD-10-CM | POA: Diagnosis not present

## 2020-01-03 DIAGNOSIS — E119 Type 2 diabetes mellitus without complications: Secondary | ICD-10-CM | POA: Diagnosis not present

## 2020-01-03 LAB — HM DIABETES EYE EXAM

## 2020-01-25 ENCOUNTER — Other Ambulatory Visit: Payer: Self-pay | Admitting: Family Medicine

## 2020-01-25 DIAGNOSIS — E785 Hyperlipidemia, unspecified: Secondary | ICD-10-CM

## 2020-01-29 DIAGNOSIS — E559 Vitamin D deficiency, unspecified: Secondary | ICD-10-CM | POA: Diagnosis not present

## 2020-02-26 DIAGNOSIS — X32XXXD Exposure to sunlight, subsequent encounter: Secondary | ICD-10-CM | POA: Diagnosis not present

## 2020-02-26 DIAGNOSIS — L570 Actinic keratosis: Secondary | ICD-10-CM | POA: Diagnosis not present

## 2020-03-09 ENCOUNTER — Other Ambulatory Visit: Payer: Self-pay | Admitting: Obstetrics and Gynecology

## 2020-03-09 DIAGNOSIS — Z1231 Encounter for screening mammogram for malignant neoplasm of breast: Secondary | ICD-10-CM

## 2020-03-26 ENCOUNTER — Telehealth: Payer: Self-pay | Admitting: Family Medicine

## 2020-03-26 NOTE — Telephone Encounter (Signed)
Caller Miranda Gonzales Call Back # [phone]    Patient requesting to check A1C., Patient no complaints just wanted it checked

## 2020-03-26 NOTE — Telephone Encounter (Signed)
Pt is due for a follow up. We can check A1C at a OV.

## 2020-04-09 ENCOUNTER — Ambulatory Visit
Admission: RE | Admit: 2020-04-09 | Discharge: 2020-04-09 | Disposition: A | Discharge status: Home / Self Care | Payer: PPO | Source: Ambulatory Visit | Attending: Obstetrics and Gynecology | Admitting: Obstetrics and Gynecology

## 2020-04-09 ENCOUNTER — Other Ambulatory Visit: Payer: Self-pay

## 2020-04-09 DIAGNOSIS — Z1231 Encounter for screening mammogram for malignant neoplasm of breast: Secondary | ICD-10-CM

## 2020-04-16 ENCOUNTER — Other Ambulatory Visit: Payer: Self-pay

## 2020-04-16 ENCOUNTER — Encounter: Payer: Self-pay | Admitting: Family Medicine

## 2020-04-16 ENCOUNTER — Ambulatory Visit (INDEPENDENT_AMBULATORY_CARE_PROVIDER_SITE_OTHER): Payer: PPO | Admitting: Family Medicine

## 2020-04-16 VITALS — BP 117/73 | HR 72 | Temp 97.5°F | Resp 16 | Ht 62.0 in | Wt 179.0 lb

## 2020-04-16 DIAGNOSIS — E119 Type 2 diabetes mellitus without complications: Secondary | ICD-10-CM | POA: Diagnosis not present

## 2020-04-16 DIAGNOSIS — I10 Essential (primary) hypertension: Secondary | ICD-10-CM

## 2020-04-16 DIAGNOSIS — E785 Hyperlipidemia, unspecified: Secondary | ICD-10-CM

## 2020-04-16 DIAGNOSIS — F419 Anxiety disorder, unspecified: Secondary | ICD-10-CM | POA: Diagnosis not present

## 2020-04-16 LAB — COMPREHENSIVE METABOLIC PANEL
ALT: 45 U/L — ABNORMAL HIGH (ref 0–35)
AST: 25 U/L (ref 0–37)
Albumin: 4.3 g/dL (ref 3.5–5.2)
Alkaline Phosphatase: 99 U/L (ref 39–117)
BUN: 18 mg/dL (ref 6–23)
CO2: 30 mEq/L (ref 19–32)
Calcium: 9.7 mg/dL (ref 8.4–10.5)
Chloride: 106 mEq/L (ref 96–112)
Creatinine, Ser: 0.82 mg/dL (ref 0.40–1.20)
GFR: 69.91 mL/min (ref >60.00)
Glucose, Bld: 104 mg/dL — ABNORMAL HIGH (ref 70–99)
Potassium: 4.3 mEq/L (ref 3.5–5.1)
Sodium: 140 mEq/L (ref 135–145)
Total Bilirubin: 0.6 mg/dL (ref 0.2–1.2)
Total Protein: 6.5 g/dL (ref 6.0–8.3)

## 2020-04-16 LAB — LIPID PANEL
Cholesterol: 119 mg/dL (ref 0–200)
HDL: 32.7 mg/dL — ABNORMAL LOW (ref >39.00)
LDL Cholesterol: 73 mg/dL (ref 0–99)
NonHDL: 86.53
Total CHOL/HDL Ratio: 4
Triglycerides: 68 mg/dL (ref 0.0–149.0)
VLDL: 13.6 mg/dL (ref 0.0–40.0)

## 2020-04-16 NOTE — Progress Notes (Signed)
Patient ID: Miranda Gonzales, female    DOB: 11/18/1955  Age: 64 y.o. MRN: [id number]    Subjective:  Subjective  HPI Miranda Gonzales presents for f/u bp and cholesterol amd dm.    No complaints   HYPERTENSION   Blood pressure range-not checking   Chest pain- no      Dyspnea- no Lightheadedness- no   Edema- no  Other side effects - no   Medication compliance: good Low salt diet- yes     DIABETES    Blood Sugar ranges-not checking   Polyuria- no New Visual problems- no  Hypoglycemic symptoms- no  Other side effects-no Medication compliance - good Last eye exam- due Foot exam- today   HYPERLIPIDEMIA  Medication compliance- good RUQ pain- no  Muscle aches- no Other side effects-no       Review of Systems  Constitutional: Negative for appetite change, diaphoresis, fatigue and unexpected weight change.  Eyes: Negative for pain, redness and visual disturbance.  Respiratory: Negative for cough, chest tightness, shortness of breath and wheezing.   Cardiovascular: Negative for chest pain, palpitations and leg swelling.  Endocrine: Negative for cold intolerance, heat intolerance, polydipsia, polyphagia and polyuria.  Genitourinary: Negative for difficulty urinating, dysuria and frequency.  Neurological: Negative for dizziness, light-headedness, numbness and headaches.    History Past Medical History:  Diagnosis Date  . Anxiety   . GERD (gastroesophageal reflux disease)   . Hyperlipidemia   . Hypertension   . Osteoporosis     She has a past surgical history that includes Colonoscopy w/ polypectomy (04/2009); Tubal ligation; Colonoscopy with propofol (N/A, 08/12/2014); and Uterine polyps.   Her family history includes Breast cancer in her maternal aunt; Cancer in her mother; Colon polyps in her father; Diabetes in her mother; Heart attack in her father; Hyperlipidemia in her sister; Hypertension in her mother and sister; Rectal cancer in her mother; Stroke in her mother.She  reports that she has never smoked. She has never used smokeless tobacco. She reports that she does not drink alcohol and does not use drugs.  Current Outpatient Medications on File Prior to Visit  Medication Sig Dispense Refill  . Acetylcarnitine HCl (ACETYL L-CARNITINE PO) Take 1 tablet by mouth daily.    . Alpha-Lipoic Acid 300 MG CAPS Take by mouth.    Marland Kitchen b complex vitamins tablet Take 1 tablet by mouth daily. B-50    . CINNAMON PO Take 6,000 mg by mouth. [place] 6000mg  once daily    . docusate sodium (COLACE) 100 MG capsule Take 100 mg by mouth daily.    Marland Kitchen escitalopram (LEXAPRO) 10 MG tablet TAKE 1 TABLET(10 MG) BY MOUTH EVERY MORNING 90 tablet 1  . Lysine 500 MG TABS Take by mouth.    . Nutritional Supplements (DHEA PO) Take by mouth daily.    Marland Kitchen olmesartan (BENICAR) 20 MG tablet Take 0.5 tablets (10 mg total) by mouth daily. 45 tablet 1  . OVER THE COUNTER MEDICATION Iron, one tablet daily.    Marland Kitchen OVER THE COUNTER MEDICATION Tumeric, one capsule daily.    Marland Kitchen OVER THE COUNTER MEDICATION Apple Cider Vinegar, one capsule daily.    Marland Kitchen OVER THE COUNTER MEDICATION Eye vitamin, doesn't know the name.    Marland Kitchen OVER THE COUNTER MEDICATION Chondrontin- glucosime,  Two tablets daily.    Marland Kitchen OVER THE COUNTER MEDICATION Magnesium, two tablets daily.    Marland Kitchen OVER THE COUNTER MEDICATION Calcium, 4 tablets daily.    Marland Kitchen OVER THE COUNTER MEDICATION Vitamin E,  one capsule daily.    Marland Kitchen OVER THE COUNTER MEDICATION Beta carotene, one capsule daily.    Marland Kitchen OVER THE COUNTER MEDICATION Garlic, one tablet daily.    Marland Kitchen OVER THE COUNTER MEDICATION MSM- one tablet daily.    Marland Kitchen OVER THE COUNTER MEDICATION 100 mg. DIM 100mg  once daily    . OVER THE COUNTER MEDICATION Take 200 mg by mouth daily. Chromium picolinate 200mg  once dayl    . OVER THE COUNTER MEDICATION Sulforaphone 400mg  once daily    . simvastatin (ZOCOR) 20 MG tablet TAKE 1 TABLET(20 MG) BY MOUTH AT BEDTIME 90 tablet 1  . VITAMIN D PO Take 50,000 Units by mouth once a  week.      No current facility-administered medications on file prior to visit.     Objective:  Objective  Physical Exam Vitals and nursing note reviewed.  Constitutional:      Appearance: She is well-developed.  HENT:     Head: Normocephalic and atraumatic.  Eyes:     Conjunctiva/sclera: Conjunctivae normal.  Neck:     Thyroid: No thyromegaly.     Vascular: No carotid bruit or JVD.  Cardiovascular:     Rate and Rhythm: Normal rate and regular rhythm.     Heart sounds: Normal heart sounds. No murmur heard.   Pulmonary:     Effort: Pulmonary effort is normal. No respiratory distress.     Breath sounds: Normal breath sounds. No wheezing or rales.  Chest:     Chest wall: No tenderness.  Musculoskeletal:     Cervical back: Normal range of motion and neck supple.  Neurological:     Mental Status: She is alert and oriented to person, place, and time.    BP 117/73 (BP Location: Left Arm, Patient Position: Sitting, Cuff Size: Small)   Pulse 72   Temp (!) 97.5 F (36.4 C) (Temporal)   Resp 16   Ht 5\' 2"  (1.575 m)   Wt 179 lb (81.2 kg)   SpO2 98%   BMI 32.74 kg/m  Wt Readings from Last 3 Encounters:  04/16/20 179 lb (81.2 kg)  10/10/19 175 lb 3.2 oz (79.5 kg)  08/15/19 173 lb (78.5 kg)     Lab Results  Component Value Date   WBC 9.1 12/23/2016   HGB 15.1 12/23/2016   HCT 46.2 (H) 12/23/2016   PLT 291 12/23/2016   GLUCOSE 96 10/10/2019   CHOL 113 10/10/2019   TRIG 67.0 10/10/2019   HDL 33.90 (L) 10/10/2019   LDLCALC 65 10/10/2019   ALT 44 (H) 10/10/2019   AST 28 10/10/2019   NA 141 10/10/2019   K 4.4 10/10/2019   CL 105 10/10/2019   CREATININE 0.79 10/10/2019   BUN 14 10/10/2019   CO2 33 (H) 10/10/2019   TSH 2.05 03/25/2015   HGBA1C 6.1 10/10/2019   MICROALBUR <0.7 04/12/2019    MM 3D SCREEN BREAST BILATERAL  Result Date: 04/10/2020 CLINICAL DATA:  Screening. EXAM: DIGITAL SCREENING BILATERAL MAMMOGRAM WITH TOMO AND CAD COMPARISON:  Previous exam(s).  ACR Breast Density Category b: There are scattered areas of fibroglandular density. FINDINGS: There are no findings suspicious for malignancy. Images were processed with CAD. IMPRESSION: No mammographic evidence of malignancy. A result letter of this screening mammogram will be mailed directly to the patient. RECOMMENDATION: Screening mammogram in one year. (Code:SM-B-01Y) BI-RADS CATEGORY  1: Negative. Electronically Signed   By: Nolon Nations M.D.   On: 04/10/2020 15:47     Assessment & Plan:  Plan  I am having [name] maintain her VITAMIN D PO, OVER THE COUNTER MEDICATION, OVER THE COUNTER MEDICATION, OVER THE COUNTER MEDICATION, OVER THE COUNTER MEDICATION, OVER THE COUNTER MEDICATION, OVER THE COUNTER MEDICATION, OVER THE COUNTER MEDICATION, OVER THE COUNTER MEDICATION, OVER THE COUNTER MEDICATION, OVER THE COUNTER MEDICATION, OVER THE COUNTER MEDICATION, CINNAMON PO, OVER THE COUNTER MEDICATION, b complex vitamins, Acetylcarnitine HCl (ACETYL L-CARNITINE PO), OVER THE COUNTER MEDICATION, Alpha-Lipoic Acid, docusate sodium, Lysine, OVER THE COUNTER MEDICATION, Nutritional Supplements (DHEA PO), escitalopram, olmesartan, and simvastatin.  No orders of the defined types were placed in this encounter.   Problem List Items Addressed This Visit      Unprioritized   Anxiety   Diet-controlled diabetes mellitus ([place])   Essential hypertension    Well controlled, no changes to meds. Encouraged heart healthy diet such as the DASH diet and exercise as tolerated.       Relevant Orders   Lipid panel   Comprehensive metabolic panel   Hyperlipidemia    Encouraged heart healthy diet, increase exercise, avoid trans fats, consider a krill oil cap daily       Other Visit Diagnoses    Dyslipidemia    -  Primary   Relevant Orders   Lipid panel   Comprehensive metabolic panel   Hyperlipidemia associated with type 2 diabetes mellitus ([place])          Follow-up: Return in about 6 months  (around 10/16/2020), or if symptoms worsen or fail to improve, for annual exam, fasting.  Ann Held, DO

## 2020-04-16 NOTE — Assessment & Plan Note (Signed)
Well controlled, no changes to meds. Encouraged heart healthy diet such as the DASH diet and exercise as tolerated.  °

## 2020-04-16 NOTE — Assessment & Plan Note (Signed)
Encouraged heart healthy diet, increase exercise, avoid trans fats, consider a krill oil cap daily 

## 2020-04-16 NOTE — Patient Instructions (Signed)

## 2020-04-22 ENCOUNTER — Telehealth: Payer: Self-pay | Admitting: Family Medicine

## 2020-04-22 NOTE — Telephone Encounter (Signed)
CallerHalli Equihua  Call Back # [phone]  Patient would someone to go lab results with her.

## 2020-04-24 NOTE — Telephone Encounter (Signed)
Spoke with patient. Pt wanted to discuss A1C and was advised A1C was not checked and will check at next visit

## 2020-05-13 ENCOUNTER — Other Ambulatory Visit: Payer: Self-pay | Admitting: Family Medicine

## 2020-05-13 DIAGNOSIS — I10 Essential (primary) hypertension: Secondary | ICD-10-CM

## 2020-06-03 DIAGNOSIS — M25511 Pain in right shoulder: Secondary | ICD-10-CM | POA: Diagnosis not present

## 2020-06-03 DIAGNOSIS — M25571 Pain in right ankle and joints of right foot: Secondary | ICD-10-CM | POA: Diagnosis not present

## 2020-06-03 DIAGNOSIS — S93401A Sprain of unspecified ligament of right ankle, initial encounter: Secondary | ICD-10-CM | POA: Diagnosis not present

## 2020-06-03 DIAGNOSIS — M79661 Pain in right lower leg: Secondary | ICD-10-CM | POA: Diagnosis not present

## 2020-06-03 DIAGNOSIS — M7541 Impingement syndrome of right shoulder: Secondary | ICD-10-CM | POA: Diagnosis not present

## 2020-06-15 ENCOUNTER — Other Ambulatory Visit: Payer: Self-pay

## 2020-06-15 ENCOUNTER — Encounter: Payer: Self-pay | Admitting: Family Medicine

## 2020-06-15 ENCOUNTER — Ambulatory Visit (INDEPENDENT_AMBULATORY_CARE_PROVIDER_SITE_OTHER): Payer: PPO | Admitting: Family Medicine

## 2020-06-15 VITALS — BP 126/86 | HR 75 | Temp 98.1°F | Resp 18 | Ht 62.0 in | Wt 179.4 lb

## 2020-06-15 DIAGNOSIS — R591 Generalized enlarged lymph nodes: Secondary | ICD-10-CM | POA: Diagnosis not present

## 2020-06-15 LAB — CBC WITH DIFFERENTIAL/PLATELET
Absolute Monocytes: 891 cells/uL (ref 200–950)
Basophils Absolute: 61 cells/uL (ref 0–200)
Basophils Relative: 0.5 %
Eosinophils Absolute: 134 cells/uL (ref 15–500)
Eosinophils Relative: 1.1 %
HCT: 45.7 % — ABNORMAL HIGH (ref 35.0–45.0)
Hemoglobin: 15 g/dL (ref 11.7–15.5)
Lymphs Abs: 1976 cells/uL (ref 850–3900)
MCH: 28.7 pg (ref 27.0–33.0)
MCHC: 32.8 g/dL (ref 32.0–36.0)
MCV: 87.5 fL (ref 80.0–100.0)
MPV: 12.1 fL (ref 7.5–12.5)
Monocytes Relative: 7.3 %
Neutro Abs: 9138 cells/uL — ABNORMAL HIGH (ref 1500–7800)
Neutrophils Relative %: 74.9 %
Platelets: 272 10*3/uL (ref 140–400)
RBC: 5.22 10*6/uL — ABNORMAL HIGH (ref 3.80–5.10)
RDW: 12 % (ref 11.0–15.0)
Total Lymphocyte: 16.2 %
WBC: 12.2 10*3/uL — ABNORMAL HIGH (ref 3.8–10.8)

## 2020-06-15 MED ORDER — CEPHALEXIN 500 MG PO CAPS: 500.0000 mg | ORAL_CAPSULE | Freq: Two times a day (BID) | ORAL | 0 refills | Status: DC

## 2020-06-15 NOTE — Patient Instructions (Signed)

## 2020-06-15 NOTE — Progress Notes (Signed)
Patient ID: KIESHA ENSEY, female    DOB: 09/04/1956  Age: 64 y.o. MRN: [id number]    Subjective:  Subjective  HPI AMANPREET DELMONT presents for lump on R side of neck ----- nontender x 1 week  No congestion , fever , uri symptoms  Review of Systems  Constitutional: Negative for appetite change, diaphoresis, fatigue and unexpected weight change.  Eyes: Negative for pain, redness and visual disturbance.  Respiratory: Negative for cough, chest tightness, shortness of breath and wheezing.   Cardiovascular: Negative for chest pain, palpitations and leg swelling.  Endocrine: Negative for cold intolerance, heat intolerance, polydipsia, polyphagia and polyuria.  Genitourinary: Negative for difficulty urinating, dysuria and frequency.  Neurological: Negative for dizziness, light-headedness, numbness and headaches.  Hematological: Positive for adenopathy.    History Past Medical History:  Diagnosis Date  . Anxiety   . GERD (gastroesophageal reflux disease)   . Hyperlipidemia   . Hypertension   . Osteoporosis     She has a past surgical history that includes Colonoscopy w/ polypectomy (04/2009); Tubal ligation; Colonoscopy with propofol (N/A, 08/12/2014); and Uterine polyps.   Her family history includes Breast cancer in her maternal aunt; Cancer in her mother; Colon polyps in her father; Diabetes in her mother; Heart attack in her father; Hyperlipidemia in her sister; Hypertension in her mother and sister; Rectal cancer in her mother; Stroke in her mother.She reports that she has never smoked. She has never used smokeless tobacco. She reports that she does not drink alcohol and does not use drugs.  Current Outpatient Medications on File Prior to Visit  Medication Sig Dispense Refill  . Acetylcarnitine HCl (ACETYL L-CARNITINE PO) Take 1 tablet by mouth daily.    . Alpha-Lipoic Acid 300 MG CAPS Take by mouth.    Marland Kitchen b complex vitamins tablet Take 1 tablet by mouth daily. B-50    . CINNAMON  PO Take 6,000 mg by mouth. [place] 6000mg  once daily    . docusate sodium (COLACE) 100 MG capsule Take 100 mg by mouth daily.    Marland Kitchen escitalopram (LEXAPRO) 10 MG tablet TAKE 1 TABLET(10 MG) BY MOUTH EVERY MORNING 90 tablet 1  . Lysine 500 MG TABS Take by mouth.    . Nutritional Supplements (DHEA PO) Take by mouth daily.    Marland Kitchen olmesartan (BENICAR) 20 MG tablet TAKE 1/2 TABLET(10 MG) BY MOUTH DAILY 45 tablet 1  . OVER THE COUNTER MEDICATION Iron, one tablet daily.    Marland Kitchen OVER THE COUNTER MEDICATION Tumeric, one capsule daily.    Marland Kitchen OVER THE COUNTER MEDICATION Apple Cider Vinegar, one capsule daily.    Marland Kitchen OVER THE COUNTER MEDICATION Eye vitamin, doesn't know the name.    Marland Kitchen OVER THE COUNTER MEDICATION Chondrontin- glucosime,  Two tablets daily.    Marland Kitchen OVER THE COUNTER MEDICATION Magnesium, two tablets daily.    Marland Kitchen OVER THE COUNTER MEDICATION Calcium, 4 tablets daily.    Marland Kitchen OVER THE COUNTER MEDICATION Vitamin E, one capsule daily.    Marland Kitchen OVER THE COUNTER MEDICATION Beta carotene, one capsule daily.    Marland Kitchen OVER THE COUNTER MEDICATION Garlic, one tablet daily.    Marland Kitchen OVER THE COUNTER MEDICATION MSM- one tablet daily.    Marland Kitchen OVER THE COUNTER MEDICATION 100 mg. DIM 100mg  once daily    . OVER THE COUNTER MEDICATION Take 200 mg by mouth daily. Chromium picolinate 200mg  once dayl    . OVER THE COUNTER MEDICATION Sulforaphone 400mg  once daily    . simvastatin (ZOCOR) 20  MG tablet TAKE 1 TABLET(20 MG) BY MOUTH AT BEDTIME 90 tablet 1  . VITAMIN D PO Take 50,000 Units by mouth once a week.      No current facility-administered medications on file prior to visit.     Objective:  Objective  Physical Exam Vitals and nursing note reviewed.  Constitutional:      Appearance: She is well-developed.  HENT:     Head: Normocephalic and atraumatic.  Eyes:     Conjunctiva/sclera: Conjunctivae normal.  Neck:     Thyroid: No thyromegaly.     Vascular: No carotid bruit or JVD.     Comments: Enlarged lymph node-- -r cervical  posterior  Cardiovascular:     Rate and Rhythm: Normal rate and regular rhythm.     Heart sounds: Normal heart sounds. No murmur heard.   Pulmonary:     Effort: Pulmonary effort is normal. No respiratory distress.     Breath sounds: Normal breath sounds. No wheezing or rales.  Chest:     Chest wall: No tenderness.  Musculoskeletal:     Cervical back: Normal range of motion and neck supple.  Lymphadenopathy:     Cervical: Cervical adenopathy present.  Neurological:     Mental Status: She is alert and oriented to person, place, and time.    BP 126/86 (BP Location: Right Arm, Patient Position: Sitting, Cuff Size: Normal)   Pulse 75   Temp 98.1 F (36.7 C) (Oral)   Resp 18   Ht 5\' 2"  (1.575 m)   Wt 179 lb 6.4 oz (81.4 kg)   SpO2 98%   BMI 32.81 kg/m  Wt Readings from Last 3 Encounters:  06/15/20 179 lb 6.4 oz (81.4 kg)  04/16/20 179 lb (81.2 kg)  10/10/19 175 lb 3.2 oz (79.5 kg)     Lab Results  Component Value Date   WBC 9.1 12/23/2016   HGB 15.1 12/23/2016   HCT 46.2 (H) 12/23/2016   PLT 291 12/23/2016   GLUCOSE 104 (H) 04/16/2020   CHOL 119 04/16/2020   TRIG 68.0 04/16/2020   HDL 32.70 (L) 04/16/2020   LDLCALC 73 04/16/2020   ALT 45 (H) 04/16/2020   AST 25 04/16/2020   NA 140 04/16/2020   K 4.3 04/16/2020   CL 106 04/16/2020   CREATININE 0.82 04/16/2020   BUN 18 04/16/2020   CO2 30 04/16/2020   TSH 2.05 03/25/2015   HGBA1C 6.1 10/10/2019   MICROALBUR <0.7 04/12/2019    MM 3D SCREEN BREAST BILATERAL  Result Date: 04/10/2020 CLINICAL DATA:  Screening. EXAM: DIGITAL SCREENING BILATERAL MAMMOGRAM WITH TOMO AND CAD COMPARISON:  Previous exam(s). ACR Breast Density Category b: There are scattered areas of fibroglandular density. FINDINGS: There are no findings suspicious for malignancy. Images were processed with CAD. IMPRESSION: No mammographic evidence of malignancy. A result letter of this screening mammogram will be mailed directly to the patient.  RECOMMENDATION: Screening mammogram in one year. (Code:SM-B-01Y) BI-RADS CATEGORY  1: Negative. Electronically Signed   By: Nolon Nations M.D.   On: 04/10/2020 15:47     Assessment & Plan:  Plan  I am having [name] start on cephALEXin. I am also having her maintain her VITAMIN D PO, OVER THE COUNTER MEDICATION, OVER THE COUNTER MEDICATION, OVER THE COUNTER MEDICATION, OVER THE COUNTER MEDICATION, OVER THE COUNTER MEDICATION, OVER THE COUNTER MEDICATION, OVER THE COUNTER MEDICATION, OVER THE COUNTER MEDICATION, OVER THE COUNTER MEDICATION, OVER THE COUNTER MEDICATION, OVER THE COUNTER MEDICATION, CINNAMON PO, OVER THE  COUNTER MEDICATION, b complex vitamins, Acetylcarnitine HCl (ACETYL L-CARNITINE PO), OVER THE COUNTER MEDICATION, Alpha-Lipoic Acid, docusate sodium, Lysine, OVER THE COUNTER MEDICATION, Nutritional Supplements (DHEA PO), simvastatin, escitalopram, and olmesartan.  Meds ordered this encounter  Medications  . cephALEXin (KEFLEX) 500 MG capsule    Sig: Take 1 capsule (500 mg total) by mouth 2 (two) times daily.    Dispense:  14 capsule    Refill:  0    Problem List Items Addressed This Visit    None    Visit Diagnoses    Lymphadenopathy    -  Primary   Relevant Medications   cephALEXin (KEFLEX) 500 MG capsule   Other Relevant Orders   CBC with Differential/Platelet    warm compresses  abx and check lab If no improvement  [place] vs ct  Follow-up: Return if symptoms worsen or fail to improve.  Ann Held, DO

## 2020-06-16 ENCOUNTER — Other Ambulatory Visit: Payer: Self-pay | Admitting: Family Medicine

## 2020-06-16 DIAGNOSIS — R591 Generalized enlarged lymph nodes: Secondary | ICD-10-CM

## 2020-06-24 ENCOUNTER — Telehealth: Payer: Self-pay

## 2020-06-24 ENCOUNTER — Other Ambulatory Visit: Payer: Self-pay | Admitting: Family Medicine

## 2020-06-24 DIAGNOSIS — R590 Localized enlarged lymph nodes: Secondary | ICD-10-CM

## 2020-06-24 NOTE — Telephone Encounter (Signed)
I ordered US

## 2020-06-24 NOTE — Telephone Encounter (Signed)
Patient notified to schedule [place].

## 2020-06-24 NOTE — Telephone Encounter (Signed)
Patient called-she was seen on 06/15/2020 for lymphadenopathy on head. She was advised to place warm compresses on it and was given cephalexin 500 mg bid for 7 days. She has finished antibiotic with no improvement. I looked in pervious note and she was suggested to have CT or [place] if not improved.  Please advise on further instruction.

## 2020-06-24 NOTE — Telephone Encounter (Signed)
Pt scheduled for US.

## 2020-06-26 ENCOUNTER — Other Ambulatory Visit: Payer: Self-pay

## 2020-06-26 ENCOUNTER — Ambulatory Visit (HOSPITAL_BASED_OUTPATIENT_CLINIC_OR_DEPARTMENT_OTHER)
Admission: RE | Admit: 2020-06-26 | Discharge: 2020-06-26 | Disposition: A | Discharge status: Home / Self Care | Payer: PPO | Source: Ambulatory Visit | Attending: Family Medicine | Admitting: Family Medicine

## 2020-06-26 DIAGNOSIS — R221 Localized swelling, mass and lump, neck: Secondary | ICD-10-CM | POA: Diagnosis not present

## 2020-06-26 DIAGNOSIS — R590 Localized enlarged lymph nodes: Secondary | ICD-10-CM | POA: Insufficient documentation

## 2020-07-02 DIAGNOSIS — N950 Postmenopausal bleeding: Secondary | ICD-10-CM | POA: Diagnosis not present

## 2020-07-15 ENCOUNTER — Other Ambulatory Visit: Payer: Self-pay

## 2020-07-15 ENCOUNTER — Other Ambulatory Visit (INDEPENDENT_AMBULATORY_CARE_PROVIDER_SITE_OTHER): Payer: PPO

## 2020-07-15 ENCOUNTER — Encounter: Payer: Self-pay | Admitting: Family Medicine

## 2020-07-15 DIAGNOSIS — R591 Generalized enlarged lymph nodes: Secondary | ICD-10-CM | POA: Diagnosis not present

## 2020-07-15 DIAGNOSIS — D582 Other hemoglobinopathies: Secondary | ICD-10-CM

## 2020-07-15 LAB — CBC WITH DIFFERENTIAL/PLATELET
Absolute Monocytes: 743 cells/uL (ref 200–950)
Basophils Absolute: 38 cells/uL (ref 0–200)
Basophils Relative: 0.5 %
Eosinophils Absolute: 210 cells/uL (ref 15–500)
Eosinophils Relative: 2.8 %
HCT: 48.2 % — ABNORMAL HIGH (ref 35.0–45.0)
Hemoglobin: 15.6 g/dL — ABNORMAL HIGH (ref 11.7–15.5)
Lymphs Abs: 1808 cells/uL (ref 850–3900)
MCH: 28.8 pg (ref 27.0–33.0)
MCHC: 32.4 g/dL (ref 32.0–36.0)
MCV: 89.1 fL (ref 80.0–100.0)
MPV: 12.3 fL (ref 7.5–12.5)
Monocytes Relative: 9.9 %
Neutro Abs: 4703 cells/uL (ref 1500–7800)
Neutrophils Relative %: 62.7 %
Platelets: 300 10*3/uL (ref 140–400)
RBC: 5.41 10*6/uL — ABNORMAL HIGH (ref 3.80–5.10)
RDW: 12.3 % (ref 11.0–15.0)
Total Lymphocyte: 24.1 %
WBC: 7.5 10*3/uL (ref 3.8–10.8)

## 2020-07-16 NOTE — Telephone Encounter (Signed)
If not causing pain or enlarging  We will see what heme says first--- if any change we will refer to ent Lenore Manner

## 2020-07-21 ENCOUNTER — Other Ambulatory Visit: Payer: Self-pay | Admitting: Family Medicine

## 2020-07-21 DIAGNOSIS — E785 Hyperlipidemia, unspecified: Secondary | ICD-10-CM

## 2020-08-04 DIAGNOSIS — N952 Postmenopausal atrophic vaginitis: Secondary | ICD-10-CM | POA: Diagnosis not present

## 2020-08-04 DIAGNOSIS — Z6833 Body mass index (BMI) 33.0-33.9, adult: Secondary | ICD-10-CM | POA: Diagnosis not present

## 2020-08-04 DIAGNOSIS — Z124 Encounter for screening for malignant neoplasm of cervix: Secondary | ICD-10-CM | POA: Diagnosis not present

## 2020-08-04 DIAGNOSIS — N950 Postmenopausal bleeding: Secondary | ICD-10-CM | POA: Diagnosis not present

## 2020-08-04 DIAGNOSIS — M8580 Other specified disorders of bone density and structure, unspecified site: Secondary | ICD-10-CM | POA: Diagnosis not present

## 2020-08-04 DIAGNOSIS — E559 Vitamin D deficiency, unspecified: Secondary | ICD-10-CM | POA: Diagnosis not present

## 2020-08-12 DIAGNOSIS — N840 Polyp of corpus uteri: Secondary | ICD-10-CM | POA: Diagnosis not present

## 2020-08-12 DIAGNOSIS — N950 Postmenopausal bleeding: Secondary | ICD-10-CM | POA: Diagnosis not present

## 2020-08-20 ENCOUNTER — Inpatient Hospital Stay: Payer: PPO

## 2020-08-20 ENCOUNTER — Ambulatory Visit: Payer: PPO | Admitting: Family

## 2020-08-24 ENCOUNTER — Other Ambulatory Visit: Payer: Self-pay | Admitting: Family

## 2020-08-24 ENCOUNTER — Inpatient Hospital Stay: Payer: PPO | Attending: Hematology & Oncology

## 2020-08-24 ENCOUNTER — Inpatient Hospital Stay (HOSPITAL_BASED_OUTPATIENT_CLINIC_OR_DEPARTMENT_OTHER): Payer: PPO | Admitting: Family

## 2020-08-24 ENCOUNTER — Other Ambulatory Visit: Payer: Self-pay

## 2020-08-24 VITALS — BP 127/77 | HR 81 | Temp 98.4°F | Resp 17 | Ht 62.0 in | Wt 187.0 lb

## 2020-08-24 DIAGNOSIS — D751 Secondary polycythemia: Secondary | ICD-10-CM

## 2020-08-24 DIAGNOSIS — K648 Other hemorrhoids: Secondary | ICD-10-CM | POA: Insufficient documentation

## 2020-08-24 DIAGNOSIS — D750 Familial erythrocytosis: Secondary | ICD-10-CM | POA: Diagnosis not present

## 2020-08-24 DIAGNOSIS — R61 Generalized hyperhidrosis: Secondary | ICD-10-CM | POA: Insufficient documentation

## 2020-08-24 DIAGNOSIS — Z803 Family history of malignant neoplasm of breast: Secondary | ICD-10-CM | POA: Diagnosis not present

## 2020-08-24 DIAGNOSIS — E785 Hyperlipidemia, unspecified: Secondary | ICD-10-CM | POA: Diagnosis not present

## 2020-08-24 DIAGNOSIS — Z79899 Other long term (current) drug therapy: Secondary | ICD-10-CM | POA: Diagnosis not present

## 2020-08-24 DIAGNOSIS — I10 Essential (primary) hypertension: Secondary | ICD-10-CM | POA: Insufficient documentation

## 2020-08-24 DIAGNOSIS — Z800 Family history of malignant neoplasm of digestive organs: Secondary | ICD-10-CM | POA: Insufficient documentation

## 2020-08-24 DIAGNOSIS — K219 Gastro-esophageal reflux disease without esophagitis: Secondary | ICD-10-CM | POA: Diagnosis not present

## 2020-08-24 DIAGNOSIS — R42 Dizziness and giddiness: Secondary | ICD-10-CM | POA: Insufficient documentation

## 2020-08-24 LAB — CMP (CANCER CENTER ONLY)
ALT: 40 U/L (ref 0–44)
AST: 26 U/L (ref 15–41)
Albumin: 4.2 g/dL (ref 3.5–5.0)
Alkaline Phosphatase: 88 U/L (ref 38–126)
Anion gap: 4 — ABNORMAL LOW (ref 5–15)
BUN: 12 mg/dL (ref 8–23)
CO2: 32 mmol/L (ref 22–32)
Calcium: 9.7 mg/dL (ref 8.9–10.3)
Chloride: 108 mmol/L (ref 98–111)
Creatinine: 0.79 mg/dL (ref 0.44–1.00)
GFR, Estimated: >60 mL/min (ref >60)
Glucose, Bld: 97 mg/dL (ref 70–99)
Potassium: 4.6 mmol/L (ref 3.5–5.1)
Sodium: 144 mmol/L (ref 135–145)
Total Bilirubin: 0.4 mg/dL (ref 0.3–1.2)
Total Protein: 6.9 g/dL (ref 6.5–8.1)

## 2020-08-24 LAB — CBC WITH DIFFERENTIAL (CANCER CENTER ONLY)
Abs Immature Granulocytes: 0.1 10*3/uL — ABNORMAL HIGH (ref 0.00–0.07)
Basophils Absolute: 0 10*3/uL (ref 0.0–0.1)
Basophils Relative: 0 %
Eosinophils Absolute: 0.3 10*3/uL (ref 0.0–0.5)
Eosinophils Relative: 3 %
HCT: 49.1 % — ABNORMAL HIGH (ref 36.0–46.0)
Hemoglobin: 15.5 g/dL — ABNORMAL HIGH (ref 12.0–15.0)
Immature Granulocytes: 1 %
Lymphocytes Relative: 20 %
Lymphs Abs: 1.8 10*3/uL (ref 0.7–4.0)
MCH: 28.9 pg (ref 26.0–34.0)
MCHC: 31.6 g/dL (ref 30.0–36.0)
MCV: 91.4 fL (ref 80.0–100.0)
Monocytes Absolute: 1 10*3/uL (ref 0.1–1.0)
Monocytes Relative: 11 %
Neutro Abs: 5.9 10*3/uL (ref 1.7–7.7)
Neutrophils Relative %: 65 %
Platelet Count: 257 10*3/uL (ref 150–400)
RBC: 5.37 MIL/uL — ABNORMAL HIGH (ref 3.87–5.11)
RDW: 13.4 % (ref 11.5–15.5)
WBC Count: 9.1 10*3/uL (ref 4.0–10.5)
nRBC: 0 % (ref 0.0–0.2)

## 2020-08-24 LAB — SAVE SMEAR(SSMR), FOR PROVIDER SLIDE REVIEW

## 2020-08-24 LAB — RETICULOCYTES
Immature Retic Fract: 12.2 % (ref 2.3–15.9)
RBC.: 5.26 MIL/uL — ABNORMAL HIGH (ref 3.87–5.11)
Retic Count, Absolute: 89.9 10*3/uL (ref 19.0–186.0)
Retic Ct Pct: 1.7 % (ref 0.4–3.1)

## 2020-08-24 LAB — LACTATE DEHYDROGENASE: LDH: 152 U/L (ref 98–192)

## 2020-08-24 NOTE — Progress Notes (Signed)
Hematology/Oncology Consultation   Name: Miranda Gonzales      MRN: [id number]    Location: Room/bed info not found  Date: 08/24/2020 Time:1:11 PM   REFERRING PHYSICIAN: Lyndal Pulley, DO  REASON FOR CONSULT:  Elevated Hgb    DIAGNOSIS: Elevated Hgb   HISTORY OF PRESENT ILLNESS:  Miranda Gonzales is a very pleasant 64 yo caucasian female with erythrocytosis. She has had an elevated Hct for several years (March 2018).  Hgb is 15.5, Hct 49.1, WBC count 9.1 and platelets 257. Her complexion is [place] which she states is normal for her. She states that she is hot natured. She states that she has occasional night sweats.  She has occasional episodes of dizziness due to vertigo.  No fever, chills, n/v, cough, rash, SOB, chest pain, palpitations, abdominal pain or changes in bowel or bladder habits.  No swelling, tenderness, numbness or tingling in her extremities.  No syncope. She tripped and fell while walking on a trail recently but thankfully she was not injured.  She does enjoy walking local trails for exercise.  She is considered prediabetic and is currently managing with diet and exercise.  No personal history of cancer. Her mother had rectal cancer.  She states that she recently had a sonohysterography with uterine cyst removal.  No history of pregnancy or miscarriage.  She had her colonoscopy with Dr. Fuller Plan in October 2020. She had internal hemorrhoids but exam was otherwise normal.  Mammogram in June 2021 was negative.  No smoking, ETOH or recreational drug use.  She has maintained a good appetite and is staying well hydrated. Her weight is described as stable. She is retired and enjoys spending time with her husband and fur babies.    ROS: All other 10 point review of systems is negative.   PAST MEDICAL HISTORY:   Past Medical History:  Diagnosis Date  . Anxiety   . GERD (gastroesophageal reflux disease)   . Hyperlipidemia   . Hypertension   . Osteoporosis     ALLERGIES: Allergies   Allergen Reactions  . Penicillins     REACTION: Rash and itching  . Bacitracin Hives      MEDICATIONS:  Current Outpatient Medications on File Prior to Visit  Medication Sig Dispense Refill  . Acetylcarnitine HCl (ACETYL L-CARNITINE PO) Take 1 tablet by mouth daily.    . Alpha-Lipoic Acid 300 MG CAPS Take by mouth.    Marland Kitchen b complex vitamins tablet Take 1 tablet by mouth daily. B-50    . cephALEXin (KEFLEX) 500 MG capsule Take 1 capsule (500 mg total) by mouth 2 (two) times daily. 14 capsule 0  . CINNAMON PO Take 6,000 mg by mouth. [place] 6000mg  once daily    . docusate sodium (COLACE) 100 MG capsule Take 100 mg by mouth daily.    Marland Kitchen escitalopram (LEXAPRO) 10 MG tablet TAKE 1 TABLET(10 MG) BY MOUTH EVERY MORNING 90 tablet 1  . Lysine 500 MG TABS Take by mouth.    . Nutritional Supplements (DHEA PO) Take by mouth daily.    Marland Kitchen olmesartan (BENICAR) 20 MG tablet TAKE 1/2 TABLET(10 MG) BY MOUTH DAILY 45 tablet 1  . OVER THE COUNTER MEDICATION Iron, one tablet daily.    Marland Kitchen OVER THE COUNTER MEDICATION Tumeric, one capsule daily.    Marland Kitchen OVER THE COUNTER MEDICATION Apple Cider Vinegar, one capsule daily.    Marland Kitchen OVER THE COUNTER MEDICATION Eye vitamin, doesn't know the name.    Marland Kitchen OVER THE COUNTER MEDICATION Chondrontin- glucosime,  Two tablets daily.    Marland Kitchen OVER THE COUNTER MEDICATION Magnesium, two tablets daily.    Marland Kitchen OVER THE COUNTER MEDICATION Calcium, 4 tablets daily.    Marland Kitchen OVER THE COUNTER MEDICATION Vitamin E, one capsule daily.    Marland Kitchen OVER THE COUNTER MEDICATION Beta carotene, one capsule daily.    Marland Kitchen OVER THE COUNTER MEDICATION Garlic, one tablet daily.    Marland Kitchen OVER THE COUNTER MEDICATION MSM- one tablet daily.    Marland Kitchen OVER THE COUNTER MEDICATION 100 mg. DIM 100mg  once daily    . OVER THE COUNTER MEDICATION Take 200 mg by mouth daily. Chromium picolinate 200mg  once dayl    . OVER THE COUNTER MEDICATION Sulforaphone 400mg  once daily    . simvastatin (ZOCOR) 20 MG tablet Take 1 tablet (20 mg total) by mouth  at bedtime. 90 tablet 1  . VITAMIN D PO Take 50,000 Units by mouth once a week.      No current facility-administered medications on file prior to visit.     PAST SURGICAL HISTORY Past Surgical History:  Procedure Laterality Date  . COLONOSCOPY W/ POLYPECTOMY  04/2009   due 2015  . COLONOSCOPY WITH PROPOFOL N/A 08/12/2014   Procedure: COLONOSCOPY WITH PROPOFOL;  Surgeon: Garlan Fair, MD;  Location: [facility];  Service: Endoscopy;  Laterality: N/A;  . TUBAL LIGATION    . Uterine polyps      FAMILY HISTORY: Family History  Problem Relation Age of Onset  . Cancer Mother        rectal  . Diabetes Mother   . Hypertension Mother   . Stroke Mother        in 48s  . Rectal cancer Mother   . Colon polyps Father   . Heart attack Father        61 & 15  . Hyperlipidemia Sister   . Hypertension Sister   . Breast cancer Maternal Aunt   . Colon cancer Neg Hx   . Esophageal cancer Neg Hx   . Ulcerative colitis Neg Hx     SOCIAL HISTORY:  reports that she has never smoked. She has never used smokeless tobacco. She reports that she does not drink alcohol and does not use drugs.  PERFORMANCE STATUS: The patient's performance status is 1 - Symptomatic but completely ambulatory  PHYSICAL EXAM: Most Recent Vital Signs: There were no vitals taken for this visit. BP 127/77 (BP Location: Left Arm, Patient Position: Sitting)   Pulse 81   Temp 98.4 F (36.9 C) (Oral)   Resp 17   Ht 5\' 2"  (1.575 m)   Wt 187 lb (84.8 kg)   BMI 34.20 kg/m   General Appearance:    Alert, cooperative, no distress, appears stated age  Head:    Normocephalic, without obvious abnormality, atraumatic  Eyes:    PERRL, conjunctiva/corneas clear, EOM's intact, fundi    benign, both eyes        Throat:   Lips, mucosa, and tongue normal; teeth and gums normal  Neck:   Supple, symmetrical, trachea midline, no adenopathy;    thyroid:  no enlargement/tenderness/nodules; no carotid   bruit or JVD  Back:      Symmetric, no curvature, ROM normal, no CVA tenderness  Lungs:     Clear to auscultation bilaterally, respirations unlabored  Chest Wall:    No tenderness or deformity   Heart:    Regular rate and rhythm, S1 and S2 normal, no murmur, rub   or gallop  Abdomen:     Soft, non-tender, bowel sounds active all four quadrants,    no masses, no organomegaly        Extremities:   Extremities normal, atraumatic, no cyanosis or edema  Pulses:   2+ and symmetric all extremities  Skin:   Skin color, texture, turgor normal, no rashes or lesions  Lymph nodes:   Cervical, supraclavicular, and axillary nodes normal  Neurologic:   CNII-XII intact, normal strength, sensation and reflexes    throughout    LABORATORY DATA:  No results found for this or any previous visit (from the past 48 hour(s)).    RADIOGRAPHY: No results found.     PATHOLOGY: None   ASSESSMENT/PLAN: Ms. Frogge is a very pleasant 64 yo caucasian female with erythrocytosis. She has had an elevated Hct for several years (March 2018).  JAK2 is pending to rule out ET/MDS.  We also checked iron studies to rule out hemochromatosis.  She is symptomatic with ruddy complexion and night sweats.  We will see what her lab results reveal and determine a plan of care.  Dr. Marin Olp was able to review her blood smear. No abnormality or evidence of malignancy noted.  Follow-up in 6 weeks.   All questions were answered and she is in agreement with the plan. She was encouraged to contact our office with questions or concerns. We can certainly see her sooner if needed.   She was discussed with Dr. Marin Olp and he is in agreement with the aforementioned.   Laverna Peace, NP

## 2020-08-25 LAB — IRON AND TIBC
Iron: 116 ug/dL (ref 41–142)
Saturation Ratios: 28 % (ref 21–57)
TIBC: 408 ug/dL (ref 236–444)
UIBC: 291 ug/dL (ref 120–384)

## 2020-08-25 LAB — FERRITIN: Ferritin: 14 ng/mL (ref 11–307)

## 2020-09-01 LAB — JAK 2 V617F (GENPATH)

## 2020-09-02 ENCOUNTER — Encounter: Payer: Self-pay | Admitting: *Deleted

## 2020-10-02 ENCOUNTER — Other Ambulatory Visit: Payer: Self-pay | Admitting: Family

## 2020-10-02 DIAGNOSIS — D751 Secondary polycythemia: Secondary | ICD-10-CM

## 2020-10-05 ENCOUNTER — Other Ambulatory Visit: Payer: Self-pay

## 2020-10-05 ENCOUNTER — Inpatient Hospital Stay: Payer: PPO | Attending: Hematology & Oncology

## 2020-10-05 ENCOUNTER — Inpatient Hospital Stay (HOSPITAL_BASED_OUTPATIENT_CLINIC_OR_DEPARTMENT_OTHER): Payer: PPO | Admitting: Family

## 2020-10-05 ENCOUNTER — Encounter: Payer: Self-pay | Admitting: Family

## 2020-10-05 ENCOUNTER — Telehealth: Payer: Self-pay | Admitting: Family

## 2020-10-05 VITALS — BP 134/71 | HR 78 | Temp 97.8°F | Resp 18 | Ht 62.0 in | Wt 185.0 lb

## 2020-10-05 DIAGNOSIS — D751 Secondary polycythemia: Secondary | ICD-10-CM

## 2020-10-05 LAB — CBC WITH DIFFERENTIAL (CANCER CENTER ONLY)
Abs Immature Granulocytes: 0.03 10*3/uL (ref 0.00–0.07)
Basophils Absolute: 0 10*3/uL (ref 0.0–0.1)
Basophils Relative: 1 %
Eosinophils Absolute: 0.2 10*3/uL (ref 0.0–0.5)
Eosinophils Relative: 3 %
HCT: 49.3 % — ABNORMAL HIGH (ref 36.0–46.0)
Hemoglobin: 15.6 g/dL — ABNORMAL HIGH (ref 12.0–15.0)
Immature Granulocytes: 0 %
Lymphocytes Relative: 24 %
Lymphs Abs: 2 10*3/uL (ref 0.7–4.0)
MCH: 29 pg (ref 26.0–34.0)
MCHC: 31.6 g/dL (ref 30.0–36.0)
MCV: 91.6 fL (ref 80.0–100.0)
Monocytes Absolute: 0.8 10*3/uL (ref 0.1–1.0)
Monocytes Relative: 9 %
Neutro Abs: 5.3 10*3/uL (ref 1.7–7.7)
Neutrophils Relative %: 63 %
Platelet Count: 267 10*3/uL (ref 150–400)
RBC: 5.38 MIL/uL — ABNORMAL HIGH (ref 3.87–5.11)
RDW: 13 % (ref 11.5–15.5)
WBC Count: 8.4 10*3/uL (ref 4.0–10.5)
nRBC: 0 % (ref 0.0–0.2)

## 2020-10-05 LAB — CMP (CANCER CENTER ONLY)
ALT: 30 U/L (ref 0–44)
AST: 21 U/L (ref 15–41)
Albumin: 4.3 g/dL (ref 3.5–5.0)
Alkaline Phosphatase: 88 U/L (ref 38–126)
Anion gap: 5 (ref 5–15)
BUN: 14 mg/dL (ref 8–23)
CO2: 32 mmol/L (ref 22–32)
Calcium: 10.6 mg/dL — ABNORMAL HIGH (ref 8.9–10.3)
Chloride: 106 mmol/L (ref 98–111)
Creatinine: 0.86 mg/dL (ref 0.44–1.00)
GFR, Estimated: >60 mL/min (ref >60)
Glucose, Bld: 105 mg/dL — ABNORMAL HIGH (ref 70–99)
Potassium: 5.4 mmol/L — ABNORMAL HIGH (ref 3.5–5.1)
Sodium: 143 mmol/L (ref 135–145)
Total Bilirubin: 0.4 mg/dL (ref 0.3–1.2)
Total Protein: 6.9 g/dL (ref 6.5–8.1)

## 2020-10-05 LAB — SAVE SMEAR(SSMR), FOR PROVIDER SLIDE REVIEW

## 2020-10-05 LAB — LACTATE DEHYDROGENASE: LDH: 149 U/L (ref 98–192)

## 2020-10-05 NOTE — Progress Notes (Signed)
Hematology and Oncology Follow Up Visit  MARYANN MCCALL [id number] Jan 18, 1956 64 y.o. 10/05/2020   Principle Diagnosis:  Erythrocytosis - JAK2 negative and iron studies WNL  Current Therapy:   Observation   Interim History:  Ms. Lightsey is here today for follow-up. She is doing well and has no complaints at this time.  She states that she is still hot natured but denies hot flashes and night sweats.  Hgb is stable at 15.6, Hct 49.3, WBC count 8.4 and platelets 275.  No fever, chills, n/v, cough, rash, dizziness, SOB, chest pain, palpitations, abdominal pain or changes in bowel or bladder habits. No episodes of blood loss. No bruising or petechiae.   No swelling, tenderness, numbness or tingling in her extremities.  No falls or syncope.  She has maintained a good appetite and is staying well hydrated. Her weight is stable.   ECOG Performance Status: 1 - Symptomatic but completely ambulatory  Medications:  Allergies as of 10/05/2020      Reactions   Penicillins    REACTION: Rash and itching   Bacitracin Hives      Medication List       Accurate as of October 05, 2020  1:07 PM. If you have any questions, ask your nurse or doctor.        ACETYL L-CARNITINE PO Take 1 tablet by mouth daily.   Alpha-Lipoic Acid 300 MG Caps Take by mouth.   b complex vitamins tablet Take 1 tablet by mouth daily. B-50   cephALEXin 500 MG capsule Commonly known as: KEFLEX Take 1 capsule (500 mg total) by mouth 2 (two) times daily.   CINNAMON PO Take 6,000 mg by mouth. [place] 6000mg  once daily   DHEA PO Take by mouth daily.   docusate sodium 100 MG capsule Commonly known as: COLACE Take 100 mg by mouth daily.   escitalopram 10 MG tablet Commonly known as: LEXAPRO TAKE 1 TABLET(10 MG) BY MOUTH EVERY MORNING   Lysine 500 MG Tabs Take by mouth.   olmesartan 20 MG tablet Commonly known as: BENICAR TAKE 1/2 TABLET(10 MG) BY MOUTH DAILY   OVER THE COUNTER MEDICATION Iron,  one tablet daily.   OVER THE COUNTER MEDICATION Tumeric, one capsule daily.   OVER THE COUNTER MEDICATION Apple Cider Vinegar, one capsule daily.   OVER THE COUNTER MEDICATION Eye vitamin, doesn't know the name.   OVER THE COUNTER MEDICATION Chondrontin- glucosime,  Two tablets daily.   OVER THE COUNTER MEDICATION Magnesium, two tablets daily.   OVER THE COUNTER MEDICATION Calcium, 4 tablets daily.   OVER THE COUNTER MEDICATION Vitamin E, one capsule daily.   OVER THE COUNTER MEDICATION Beta carotene, one capsule daily.   OVER THE COUNTER MEDICATION Garlic, one tablet daily.   OVER THE COUNTER MEDICATION MSM- one tablet daily.   OVER THE COUNTER MEDICATION 100 mg. DIM 100mg  once daily   OVER THE COUNTER MEDICATION Take 200 mg by mouth daily. Chromium picolinate 200mg  once dayl   OVER THE COUNTER MEDICATION Sulforaphone 400mg  once daily   simvastatin 20 MG tablet Commonly known as: ZOCOR Take 1 tablet (20 mg total) by mouth at bedtime.   VITAMIN D PO Take 50,000 Units by mouth once a week.       Allergies:  Allergies  Allergen Reactions   Penicillins     REACTION: Rash and itching   Bacitracin Hives    Past Medical History, Surgical history, Social history, and Family History were reviewed and updated.  Review of  Systems: All other 10 point review of systems is negative.   Physical Exam:  vitals were not taken for this visit.   Wt Readings from Last 3 Encounters:  08/24/20 187 lb (84.8 kg)  06/15/20 179 lb 6.4 oz (81.4 kg)  04/16/20 179 lb (81.2 kg)    Ocular: Sclerae unicteric, pupils equal, round and reactive to light Ear-nose-throat: Oropharynx clear, dentition fair Lymphatic: No cervical or supraclavicular adenopathy Lungs no rales or rhonchi, good excursion bilaterally Heart regular rate and rhythm, no murmur appreciated Abd soft, nontender, positive bowel sounds MSK no focal spinal tenderness, no joint edema Neuro: non-focal,  well-oriented, appropriate affect Breasts: Deferred   Lab Results  Component Value Date   WBC 8.4 10/05/2020   HGB 15.6 (H) 10/05/2020   HCT 49.3 (H) 10/05/2020   MCV 91.6 10/05/2020   PLT 267 10/05/2020   Lab Results  Component Value Date   FERRITIN 14 08/24/2020   IRON 116 08/24/2020   TIBC 408 08/24/2020   UIBC 291 08/24/2020   IRONPCTSAT 28 08/24/2020   Lab Results  Component Value Date   RETICCTPCT 1.7 08/24/2020   RBC 5.38 (H) 10/05/2020   No results found for: KPAFRELGTCHN, LAMBDASER, KAPLAMBRATIO No results found for: IGGSERUM, IGA, IGMSERUM No results found for: Ronnald Ramp, A1GS, A2GS, Tillman Sers, SPEI   Chemistry      Component Value Date/Time   NA 144 08/24/2020 1256   K 4.6 08/24/2020 1256   CL 108 08/24/2020 1256   CO2 32 08/24/2020 1256   BUN 12 08/24/2020 1256   CREATININE 0.79 08/24/2020 1256   CREATININE 0.76 12/23/2016 1515      Component Value Date/Time   CALCIUM 9.7 08/24/2020 1256   ALKPHOS 88 08/24/2020 1256   AST 26 08/24/2020 1256   ALT 40 08/24/2020 1256   BILITOT 0.4 08/24/2020 1256       Impression and Plan: Ms. Mccollom is a very pleasant 64 yo caucasian female with history of mild erythrocytosis. She was JAK2 negative and iron studies were WNL. We did get an ep level on her today.  I went over her current lab work with Dr. Marin Olp and at this time we will release her back to her PCP.  She was encouraged to contact our office with any heme/onc related questions or concerns. She is in agreement. We can certainly see her again in the future if needed.   Laverna Peace, NP 12/13/20211:07 PM

## 2020-10-05 NOTE — Telephone Encounter (Signed)
Will follow-up if needed. Released at this time back to PCP. Per 12/13 los

## 2020-10-06 LAB — ERYTHROPOIETIN: Erythropoietin: 8.5 m[IU]/mL (ref 2.6–18.5)

## 2020-10-20 ENCOUNTER — Other Ambulatory Visit: Payer: Self-pay | Admitting: Family Medicine

## 2020-10-20 ENCOUNTER — Encounter: Payer: PPO | Admitting: Family Medicine

## 2020-10-20 DIAGNOSIS — I10 Essential (primary) hypertension: Secondary | ICD-10-CM

## 2020-10-22 ENCOUNTER — Other Ambulatory Visit: Payer: Self-pay | Admitting: *Deleted

## 2020-10-22 MED ORDER — ESCITALOPRAM OXALATE 10 MG PO TABS: 10.0000 mg | ORAL_TABLET | Freq: Every morning | ORAL | 1 refills | Status: DC

## 2020-11-02 ENCOUNTER — Other Ambulatory Visit: Payer: Self-pay

## 2020-11-02 ENCOUNTER — Ambulatory Visit (INDEPENDENT_AMBULATORY_CARE_PROVIDER_SITE_OTHER): Payer: PPO | Admitting: Family Medicine

## 2020-11-02 ENCOUNTER — Encounter: Payer: Self-pay | Admitting: Family Medicine

## 2020-11-02 VITALS — BP 124/80 | HR 77 | Temp 98.4°F | Ht 62.0 in | Wt 186.2 lb

## 2020-11-02 DIAGNOSIS — E785 Hyperlipidemia, unspecified: Secondary | ICD-10-CM

## 2020-11-02 DIAGNOSIS — E119 Type 2 diabetes mellitus without complications: Secondary | ICD-10-CM | POA: Diagnosis not present

## 2020-11-02 DIAGNOSIS — Z23 Encounter for immunization: Secondary | ICD-10-CM | POA: Diagnosis not present

## 2020-11-02 DIAGNOSIS — I10 Essential (primary) hypertension: Secondary | ICD-10-CM

## 2020-11-02 DIAGNOSIS — Z0000 Encounter for general adult medical examination without abnormal findings: Secondary | ICD-10-CM

## 2020-11-02 LAB — LIPID PANEL
Cholesterol: 119 mg/dL (ref 0–200)
HDL: 37.5 mg/dL — ABNORMAL LOW (ref >39.00)
LDL Cholesterol: 67 mg/dL (ref 0–99)
NonHDL: 81.73
Total CHOL/HDL Ratio: 3
Triglycerides: 76 mg/dL (ref 0.0–149.0)
VLDL: 15.2 mg/dL (ref 0.0–40.0)

## 2020-11-02 LAB — COMPREHENSIVE METABOLIC PANEL
ALT: 36 U/L — ABNORMAL HIGH (ref 0–35)
AST: 23 U/L (ref 0–37)
Albumin: 4.2 g/dL (ref 3.5–5.2)
Alkaline Phosphatase: 84 U/L (ref 39–117)
BUN: 15 mg/dL (ref 6–23)
CO2: 32 mEq/L (ref 19–32)
Calcium: 9.6 mg/dL (ref 8.4–10.5)
Chloride: 105 mEq/L (ref 96–112)
Creatinine, Ser: 0.77 mg/dL (ref 0.40–1.20)
GFR: 80.73 mL/min (ref >60.00)
Glucose, Bld: 107 mg/dL — ABNORMAL HIGH (ref 70–99)
Potassium: 4.6 mEq/L (ref 3.5–5.1)
Sodium: 140 mEq/L (ref 135–145)
Total Bilirubin: 0.5 mg/dL (ref 0.2–1.2)
Total Protein: 6.5 g/dL (ref 6.0–8.3)

## 2020-11-02 LAB — VITAMIN D 25 HYDROXY (VIT D DEFICIENCY, FRACTURES): VITD: 26.73 ng/mL — ABNORMAL LOW (ref 30.00–100.00)

## 2020-11-02 LAB — HEMOGLOBIN A1C: Hgb A1c MFr Bld: 6.5 % (ref 4.6–6.5)

## 2020-11-02 NOTE — Patient Instructions (Signed)
Preventive Care 84-66 Years Old, Female Preventive care refers to lifestyle choices and visits with your health care provider that can promote health and wellness. This includes:  A yearly physical exam. This is also called an annual wellness visit.  Regular dental and eye exams.  Immunizations.  Screening for certain conditions.  Healthy lifestyle choices, such as: ? Eating a healthy diet. ? Getting regular exercise. ? Not using drugs or products that contain nicotine and tobacco. ? Limiting alcohol use. What can I expect for my preventive care visit? Physical exam Your health care provider will check your:  Height and weight. These may be used to calculate your BMI (body mass index). BMI is a measurement that tells if you are at a healthy weight.  Heart rate and blood pressure.  Body temperature.  Skin for abnormal spots. Counseling Your health care provider may ask you questions about your:  Past medical problems.  Family's medical history.  Alcohol, tobacco, and drug use.  Emotional well-being.  Home life and relationship well-being.  Sexual activity.  Diet, exercise, and sleep habits.  Work and work Statistician.  Access to firearms.  Method of birth control.  Menstrual cycle.  Pregnancy history. What immunizations do I need? Vaccines are usually given at various ages, according to a schedule. Your health care provider will recommend vaccines for you based on your age, medical history, and lifestyle or other factors, such as travel or where you work.   What tests do I need? Blood tests  Lipid and cholesterol levels. These may be checked every 5 years, or more often if you are over 66 years old.  Hepatitis C test.  Hepatitis B test. Screening  Lung cancer screening. You may have this screening every year starting at age 66 if you have a 30-pack-year history of smoking and currently smoke or have quit within the past 15 years.  Colorectal cancer  screening. ? All adults should have this screening starting at age 66 and continuing until age 17. ? Your health care provider may recommend screening at age 49 if you are at increased risk. ? You will have tests every 1-10 years, depending on your results and the type of screening test.  Diabetes screening. ? This is done by checking your blood sugar (glucose) after you have not eaten for a while (fasting). ? You may have this done every 1-3 years.  Mammogram. ? This may be done every 1-2 years. ? Talk with your health care provider about when you should start having regular mammograms. This may depend on whether you have a family history of breast cancer.  BRCA-related cancer screening. This may be done if you have a family history of breast, ovarian, tubal, or peritoneal cancers.  Pelvic exam and Pap test. ? This may be done every 3 years starting at age 10. ? Starting at age 11, this may be done every 5 years if you have a Pap test in combination with an HPV test. Other tests  STD (sexually transmitted disease) testing, if you are at risk.  Bone density scan. This is done to screen for osteoporosis. You may have this scan if you are at high risk for osteoporosis. Talk with your health care provider about your test results, treatment options, and if necessary, the need for more tests. Follow these instructions at home: Eating and drinking  Eat a diet that includes fresh fruits and vegetables, whole grains, lean protein, and low-fat dairy products.  Take vitamin and mineral supplements  as recommended by your health care provider.  Do not drink alcohol if: ? Your health care provider tells you not to drink. ? You are pregnant, may be pregnant, or are planning to become pregnant.  If you drink alcohol: ? Limit how much you have to 0-1 drink a day. ? Be aware of how much alcohol is in your drink. In the [place] one drink equals one 12 oz bottle of beer (355 mL), one 5 oz glass of  wine (148 mL), or one 1 oz glass of hard liquor (44 mL).   Lifestyle  Take daily care of your teeth and gums. Brush your teeth every morning and night with fluoride toothpaste. Floss one time each day.  Stay active. Exercise for at least 30 minutes 5 or more days each week.  Do not use any products that contain nicotine or tobacco, such as cigarettes, e-cigarettes, and chewing tobacco. If you need help quitting, ask your health care provider.  Do not use drugs.  If you are sexually active, practice safe sex. Use a condom or other form of protection to prevent STIs (sexually transmitted infections).  If you do not wish to become pregnant, use a form of birth control. If you plan to become pregnant, see your health care provider for a prepregnancy visit.  If told by your health care provider, take low-dose aspirin daily starting at age 50.  Find healthy ways to cope with stress, such as: ? Meditation, yoga, or listening to music. ? Journaling. ? Talking to a trusted person. ? Spending time with friends and family. Safety  Always wear your seat belt while driving or riding in a vehicle.  Do not drive: ? If you have been drinking alcohol. Do not ride with someone who has been drinking. ? When you are tired or distracted. ? While texting.  Wear a helmet and other protective equipment during sports activities.  If you have firearms in your house, make sure you follow all gun safety procedures. What's next?  Visit your health care provider once a year for an annual wellness visit.  Ask your health care provider how often you should have your eyes and teeth checked.  Stay up to date on all vaccines. This information is not intended to replace advice given to you by your health care provider. Make sure you discuss any questions you have with your health care provider. Document Revised: 07/14/2020 Document Reviewed: 06/21/2018 Elsevier Patient Education  2021 [employer] Inc.  

## 2020-11-02 NOTE — Progress Notes (Signed)
Subjective:    Miranda Gonzales is a 65 y.o. female who presents for a Welcome to Medicare exam.   Review of Systems     Review of Systems  Constitutional: Negative for activity change, appetite change and fatigue.  HENT: Negative for hearing loss, congestion, tinnitus and ear discharge.   Eyes: Negative for visual disturbance (see optho q1y -- vision corrected to 20/20 with glasses).  Respiratory: Negative for cough, chest tightness and shortness of breath.   Cardiovascular: Negative for chest pain, palpitations and leg swelling.  Gastrointestinal: Negative for abdominal pain, diarrhea, constipation and abdominal distention.  Genitourinary: Negative for urgency, frequency, decreased urine volume and difficulty urinating.  Musculoskeletal: Negative for back pain, arthralgias and gait problem.  Skin: Negative for color change, pallor and rash.  Neurological: Negative for dizziness, light-headedness, numbness and headaches.  Hematological: Negative for adenopathy. Does not bruise/bleed easily.  Psychiatric/Behavioral: Negative for suicidal ideas, confusion, sleep disturbance, self-injury, dysphoric mood, decreased concentration and agitation.  Pt is able to read and write and can do all ADLs No risk for falling No abuse/ violence in home        Objective:    Today's Vitals   11/02/20 0958  BP: 124/80  Pulse: 77  Temp: 98.4 F (36.9 C)  TempSrc: Oral  SpO2: 98%  Weight: 186 lb 3.2 oz (84.5 kg)  Height: 5\' 2"  (1.575 m)  Body mass index is 34.06 kg/m.  Medications Outpatient Encounter Medications as of 11/02/2020  Medication Sig  . Acetylcarnitine HCl (ACETYL L-CARNITINE PO) Take 1 tablet by mouth daily.  . Alpha-Lipoic Acid 300 MG CAPS Take by mouth.  Marland Kitchen b complex vitamins tablet Take 1 tablet by mouth daily. B-50  . CINNAMON PO Take 6,000 mg by mouth. Ceylon 6000mg  once daily  . docusate sodium (COLACE) 100 MG capsule Take 100 mg by mouth daily.  Marland Kitchen escitalopram  (LEXAPRO) 10 MG tablet Take 1 tablet (10 mg total) by mouth in the morning.  Marland Kitchen Lysine 500 MG TABS Take by mouth.  . Nutritional Supplements (DHEA PO) Take by mouth daily.  Marland Kitchen olmesartan (BENICAR) 20 MG tablet TAKE 1/2 TABLET(10 MG) BY MOUTH DAILY  . OVER THE COUNTER MEDICATION Iron, one tablet daily.  Marland Kitchen OVER THE COUNTER MEDICATION Tumeric, one capsule daily.  Marland Kitchen OVER THE COUNTER MEDICATION Apple Cider Vinegar, one capsule daily.  Marland Kitchen OVER THE COUNTER MEDICATION Eye vitamin, doesn't know the name.  Marland Kitchen OVER THE COUNTER MEDICATION Chondrontin- glucosime,  Two tablets daily.  Marland Kitchen OVER THE COUNTER MEDICATION Magnesium, two tablets daily.  Marland Kitchen OVER THE COUNTER MEDICATION Calcium, 4 tablets daily.  Marland Kitchen OVER THE COUNTER MEDICATION Vitamin E, one capsule daily.  Marland Kitchen OVER THE COUNTER MEDICATION Beta carotene, one capsule daily.  Marland Kitchen OVER THE COUNTER MEDICATION Garlic, one tablet daily.  Marland Kitchen OVER THE COUNTER MEDICATION MSM- one tablet daily.  Marland Kitchen OVER THE COUNTER MEDICATION 100 mg. DIM 100mg  once daily  . OVER THE COUNTER MEDICATION Take 200 mg by mouth daily. Chromium picolinate 200mg  once dayl  . OVER THE COUNTER MEDICATION Sulforaphone 400mg  once daily  . simvastatin (ZOCOR) 20 MG tablet Take 1 tablet (20 mg total) by mouth at bedtime.  Marland Kitchen VITAMIN D PO Take 50,000 Units by mouth once a week.    No facility-administered encounter medications on file as of 11/02/2020.     History: Past Medical History:  Diagnosis Date  . Anxiety   . GERD (gastroesophageal reflux disease)   . Hyperlipidemia   .  Hypertension   . Osteoporosis    Past Surgical History:  Procedure Laterality Date  . COLONOSCOPY W/ POLYPECTOMY  04/2009   due 2015  . COLONOSCOPY WITH PROPOFOL N/A 08/12/2014   Procedure: COLONOSCOPY WITH PROPOFOL;  Surgeon: Garlan Fair, MD;  Location: [facility];  Service: Endoscopy;  Laterality: N/A;  . TUBAL LIGATION    . Uterine polyps      Family History  Problem Relation Age of Onset  . Cancer Mother         rectal  . Diabetes Mother   . Hypertension Mother   . Stroke Mother        in 8s  . Rectal cancer Mother   . Colon polyps Father   . Heart attack Father        75 & 82  . Hyperlipidemia Sister   . Hypertension Sister   . Breast cancer Maternal Aunt   . Colon cancer Neg Hx   . Esophageal cancer Neg Hx   . Ulcerative colitis Neg Hx    Social History   Occupational History  . Not on file  Tobacco Use  . Smoking status: Never Smoker  . Smokeless tobacco: Never Used  Vaping Use  . Vaping Use: Never used  Substance and Sexual Activity  . Alcohol use: No  . Drug use: No  . Sexual activity: Not on file  +  Tobacco Counseling Counseling given: Not Answered   Immunizations and Health Maintenance Immunization History  Administered Date(s) Administered  . Influenza,inj,Quad PF,6+ Mos 08/29/2018, 07/02/2019  . Influenza-Unspecified 08/29/2020  . PFIZER SARS-COV-2 Vaccination 01/16/2020, 02/11/2020, 08/29/2020  . Pneumococcal Conjugate-13 03/30/2015  . Pneumococcal Polysaccharide-23 12/23/2016  . Tdap 12/23/2016  . Zoster 03/30/2015  . Zoster Recombinat (Shingrix) 12/05/2017, 02/12/2018   Health Maintenance Due  Topic Date Due  . HEMOGLOBIN A1C  04/09/2020    Activities of Daily Living In your present state of health, do you have any difficulty performing the following activities: 11/02/2020 04/16/2020  Hearing? N N  Vision? N N  Difficulty concentrating or making decisions? N N  Walking or climbing stairs? N N  Dressing or bathing? N N  Doing errands, shopping? N N  Some recent data might be hidden    Physical Exam  BP 124/80 (BP Location: Right Arm, Patient Position: Sitting, Cuff Size: Large)   Pulse 77   Temp 98.4 F (36.9 C) (Oral)   Ht 5\' 2"  (1.575 m)   Wt 186 lb 3.2 oz (84.5 kg)   SpO2 98%   BMI 34.06 kg/m  General appearance: alert, cooperative, appears stated age and no distress Head: Normocephalic, without obvious abnormality,  atraumatic Eyes: negative findings: lids and lashes normal, conjunctivae and sclerae normal and pupils equal, round, reactive to light and accomodation Ears: normal TM's and external ear canals both ears Neck: no adenopathy, no carotid bruit, no JVD, supple, symmetrical, trachea midline and thyroid not enlarged, symmetric, no tenderness/mass/nodules Back: symmetric, no curvature. ROM normal. No CVA tenderness. Lungs: clear to auscultation bilaterally Breasts: gyn Heart: regular rate and rhythm, S1, S2 normal, no murmur, click, rub or gallop Abdomen: soft, non-tender; bowel sounds normal; no masses,  no organomegaly Pelvic: deferred -gyn Extremities: extremities normal, atraumatic, no cyanosis or edema Pulses: 2+ and symmetric Skin: Skin color, texture, turgor normal. No rashes or lesions Lymph nodes: Cervical, supraclavicular, and axillary nodes normal. Neurologic: Alert and oriented X 3, normal strength and tone. Normal symmetric reflexes. Normal coordination and gait  Advanced Directives: Does  Patient Have a Medical Advance Directive?: No Would patient like information on creating a medical advance directive?: Yes (MAU/Ambulatory/Procedural Areas - Information given)    Assessment:    This is a routine wellness examination for this patient .   Vision/Hearing screen  Hearing Screening   125Hz  250Hz  500Hz  1000Hz  2000Hz  3000Hz  4000Hz  6000Hz  8000Hz   Right ear:           Left ear:           Comments: Normal whisper    Visual Acuity Screening   Right eye Left eye Both eyes  Without correction:     With correction: 20/20 20/20 20/20     Dietary issues and exercise activities discussed:  Current Exercise Habits: The patient does not participate in regular exercise at present, Exercise limited by: None identified  Goals   None    Depression Screen PHQ 2/9 Scores 11/02/2020 04/16/2020 01/23/2018 12/23/2016  PHQ - 2 Score 0 0 0 0  PHQ- 9 Score 0 - - -     Fall Risk Fall Risk   11/02/2020  Falls in the past year? 0  Number falls in past yr: 0  Injury with Fall? 0  Follow up Falls evaluation completed    Cognitive Function: MMSE - Mini Mental State Exam 11/02/2020  Orientation to time 5  Orientation to Place 5  Registration 3  Attention/ Calculation 5  Recall 3  Language- name 2 objects 2  Language- repeat 1  Language- follow 3 step command 3  Language- read & follow direction 1  Write a sentence 1  Copy design 1  Total score 30        Patient Care Team: Carollee Herter, Alferd Apa, DO as PCP - General (Family Medicine) Everlene Farrier, MD as Consulting Physician (Obstetrics and Gynecology) Katy Apo, MD as Consulting Physician (Ophthalmology)     Plan:     I have personally reviewed and noted the following in the patient's chart:   . Medical and social history . Use of alcohol, tobacco or illicit drugs  . Current medications and supplements . Functional ability and status . Nutritional status . Physical activity . Advanced directives . List of other physicians . Hospitalizations, surgeries, and ER visits in previous 12 months . Vitals . Screenings to include cognitive, depression, and falls . Referrals and appointments  In addition, I have reviewed and discussed with patient certain preventive protocols, quality metrics, and best practice recommendations. A written personalized care plan for preventive services as well as general preventive health recommendations were provided to patient.     [place], DO 11/02/2020

## 2020-11-02 NOTE — Progress Notes (Signed)
Subjective:    Miranda Gonzales is a 65 y.o. female who presents for a Welcome to Medicare exam.   HYPERTENSION   Blood pressure range-good per pt   Chest pain- no      Dyspnea- no Lightheadedness- no   Edema- no  Other side effects - no   Medication compliance: good Low salt diet- no    DIABETES    Blood Sugar ranges-good per pt   Polyuria- no New Visual problems- no  Hypoglycemic symptoms- no  Other side effects-no Medication compliance - good Last eye exam- due Foot exam- today   HYPERLIPIDEMIA  Medication compliance- no RUQ pain- no  Muscle aches- no Other side effects-no      Review of Systems     Review of Systems  Constitutional: Negative for activity change, appetite change and fatigue.  HENT: Negative for hearing loss, congestion, tinnitus and ear discharge.   Eyes: Negative for visual disturbance (see optho q1y -- vision corrected to 20/20 with glasses).  Respiratory: Negative for cough, chest tightness and shortness of breath.   Cardiovascular: Negative for chest pain, palpitations and leg swelling.  Gastrointestinal: Negative for abdominal pain, diarrhea, constipation and abdominal distention.  Genitourinary: Negative for urgency, frequency, decreased urine volume and difficulty urinating.  Musculoskeletal: Negative for back pain, arthralgias and gait problem.  Skin: Negative for color change, pallor and rash.  Neurological: Negative for dizziness, light-headedness, numbness and headaches.  Hematological: Negative for adenopathy. Does not bruise/bleed easily.  Psychiatric/Behavioral: Negative for suicidal ideas, confusion, sleep disturbance, self-injury, dysphoric mood, decreased concentration and agitation.  Pt is able to read and write and can do all ADLs No risk for falling No abuse/ violence in home        Objective:    Today's Vitals   11/02/20 0958  BP: 124/80  Pulse: 77  Temp: 98.4 F (36.9 C)  TempSrc: Oral  SpO2: 98%  Weight: 186  lb 3.2 oz (84.5 kg)  Height: 5\' 2"  (1.575 m)  Body mass index is 34.06 kg/m.  Medications Outpatient Encounter Medications as of 11/02/2020  Medication Sig  . Acetylcarnitine HCl (ACETYL L-CARNITINE PO) Take 1 tablet by mouth daily.  . Alpha-Lipoic Acid 300 MG CAPS Take by mouth.  Marland Kitchen b complex vitamins tablet Take 1 tablet by mouth daily. B-50  . CINNAMON PO Take 6,000 mg by mouth. [place] 6000mg  once daily  . docusate sodium (COLACE) 100 MG capsule Take 100 mg by mouth daily.  Marland Kitchen escitalopram (LEXAPRO) 10 MG tablet Take 1 tablet (10 mg total) by mouth in the morning.  Marland Kitchen Lysine 500 MG TABS Take by mouth.  . Nutritional Supplements (DHEA PO) Take by mouth daily.  Marland Kitchen olmesartan (BENICAR) 20 MG tablet TAKE 1/2 TABLET(10 MG) BY MOUTH DAILY  . OVER THE COUNTER MEDICATION Iron, one tablet daily.  Marland Kitchen OVER THE COUNTER MEDICATION Tumeric, one capsule daily.  Marland Kitchen OVER THE COUNTER MEDICATION Apple Cider Vinegar, one capsule daily.  Marland Kitchen OVER THE COUNTER MEDICATION Eye vitamin, doesn't know the name.  Marland Kitchen OVER THE COUNTER MEDICATION Chondrontin- glucosime,  Two tablets daily.  Marland Kitchen OVER THE COUNTER MEDICATION Magnesium, two tablets daily.  Marland Kitchen OVER THE COUNTER MEDICATION Calcium, 4 tablets daily.  Marland Kitchen OVER THE COUNTER MEDICATION Vitamin E, one capsule daily.  Marland Kitchen OVER THE COUNTER MEDICATION Beta carotene, one capsule daily.  Marland Kitchen OVER THE COUNTER MEDICATION Garlic, one tablet daily.  Marland Kitchen OVER THE COUNTER MEDICATION MSM- one tablet daily.  Marland Kitchen OVER THE COUNTER MEDICATION 100  mg. DIM 100mg  once daily  . OVER THE COUNTER MEDICATION Take 200 mg by mouth daily. Chromium picolinate 200mg  once dayl  . OVER THE COUNTER MEDICATION Sulforaphone 400mg  once daily  . simvastatin (ZOCOR) 20 MG tablet Take 1 tablet (20 mg total) by mouth at bedtime.  Marland Kitchen VITAMIN D PO Take 50,000 Units by mouth once a week.    No facility-administered encounter medications on file as of 11/02/2020.     History: Past Medical History:  Diagnosis Date  .  Anxiety   . GERD (gastroesophageal reflux disease)   . Hyperlipidemia   . Hypertension   . Osteoporosis    Past Surgical History:  Procedure Laterality Date  . COLONOSCOPY W/ POLYPECTOMY  04/2009   due 2015  . COLONOSCOPY WITH PROPOFOL N/A 08/12/2014   Procedure: COLONOSCOPY WITH PROPOFOL;  Surgeon: Garlan Fair, MD;  Location: [facility];  Service: Endoscopy;  Laterality: N/A;  . TUBAL LIGATION    . Uterine polyps      Family History  Problem Relation Age of Onset  . Cancer Mother        rectal  . Diabetes Mother   . Hypertension Mother   . Stroke Mother        in 47s  . Rectal cancer Mother   . Colon polyps Father   . Heart attack Father        17 & 34  . Hyperlipidemia Sister   . Hypertension Sister   . Breast cancer Maternal Aunt   . Colon cancer Neg Hx   . Esophageal cancer Neg Hx   . Ulcerative colitis Neg Hx    Social History   Occupational History  . Not on file  Tobacco Use  . Smoking status: Never Smoker  . Smokeless tobacco: Never Used  Vaping Use  . Vaping Use: Never used  Substance and Sexual Activity  . Alcohol use: No  . Drug use: No  . Sexual activity: Not on file  +  Tobacco Counseling Counseling given: Not Answered   Immunizations and Health Maintenance Immunization History  Administered Date(s) Administered  . Influenza,inj,Quad PF,6+ Mos 08/29/2018, 07/02/2019  . Influenza-Unspecified 08/29/2020  . PFIZER SARS-COV-2 Vaccination 01/16/2020, 02/11/2020, 08/29/2020  . Pneumococcal Conjugate-13 03/30/2015  . Pneumococcal Polysaccharide-23 12/23/2016  . Tdap 12/23/2016  . Zoster 03/30/2015  . Zoster Recombinat (Shingrix) 12/05/2017, 02/12/2018   Health Maintenance Due  Topic Date Due  . HEMOGLOBIN A1C  04/09/2020    Activities of Daily Living In your present state of health, do you have any difficulty performing the following activities: 11/02/2020 04/16/2020  Hearing? N N  Vision? N N  Difficulty concentrating or making  decisions? N N  Walking or climbing stairs? N N  Dressing or bathing? N N  Doing errands, shopping? N N  Some recent data might be hidden    Physical Exam  .(optional), or other factors deemed appropriate based on the beneficiary's medical and social history and current clinical standards.  Advanced Directives: Does Patient Have a Medical Advance Directive?: No Would patient like information on creating a medical advance directive?: Yes (MAU/Ambulatory/Procedural Areas - Information given)    Assessment:    This is a routine wellness examination for this patient .    Vision/Hearing screen  Hearing Screening   125Hz  250Hz  500Hz  1000Hz  2000Hz  3000Hz  4000Hz  6000Hz  8000Hz   Right ear:           Left ear:           Comments:  Normal whisper    Visual Acuity Screening   Right eye Left eye Both eyes  Without correction:     With correction: 20/20 20/20 20/20     Dietary issues and exercise activities discussed:  Current Exercise Habits: The patient does not participate in regular exercise at present, Exercise limited by: None identified  Goals   None    Depression Screen PHQ 2/9 Scores 11/02/2020 04/16/2020 01/23/2018 12/23/2016  PHQ - 2 Score 0 0 0 0  PHQ- 9 Score 0 - - -     Fall Risk Fall Risk  11/02/2020  Falls in the past year? 0  Number falls in past yr: 0  Injury with Fall? 0  Follow up Falls evaluation completed    Cognitive Function: MMSE - Mini Mental State Exam 11/02/2020  Orientation to time 5  Orientation to Place 5  Registration 3  Attention/ Calculation 5  Recall 3  Language- name 2 objects 2  Language- repeat 1  Language- follow 3 step command 3  Language- read & follow direction 1  Write a sentence 1  Copy design 1  Total score 30        Patient Care Team: Carollee Herter, Alferd Apa, DO as PCP - General (Family Medicine) Everlene Farrier, MD as Consulting Physician (Obstetrics and Gynecology) Katy Apo, MD as Consulting Physician  (Ophthalmology)     Plan:     I have personally reviewed and noted the following in the patient's chart:   . Medical and social history . Use of alcohol, tobacco or illicit drugs  . Current medications and supplements . Functional ability and status . Nutritional status . Physical activity . Advanced directives . List of other physicians . Hospitalizations, surgeries, and ER visits in previous 12 months . Vitals . Screenings to include cognitive, depression, and falls . Referrals and appointments  In addition, I have reviewed and discussed with patient certain preventive protocols, quality metrics, and best practice recommendations. A written personalized care plan for preventive services as well as general preventive health recommendations were provided to patient. 1. Welcome to Medicare preventive visit nsr - EKG 12-Lead  2. Need for pneumococcal vaccination   - Pneumococcal polysaccharide vaccine 23-valent greater than or equal to 2yo subcutaneous/IM  3. Hyperlipidemia, unspecified hyperlipidemia type Encouraged heart healthy diet, increase exercise, avoid trans fats, consider a krill oil cap daily - Lipid panel - Comprehensive metabolic panel - Vitamin D (25 hydroxy)  4. Primary hypertension Well controlled, no changes to meds. Encouraged heart healthy diet such as the DASH diet and exercise as tolerated.  - Lipid panel - Comprehensive metabolic panel  5. Diet-controlled diabetes mellitus ([place]) hgba1c to be checked, minimize simple carbs. Increase exercise as tolerated. Continue current meds  - Hemoglobin A1c  6. Encounter for Medicare annual wellness exam See above      Ann Held, DO 11/02/2020

## 2020-11-04 ENCOUNTER — Other Ambulatory Visit: Payer: Self-pay

## 2020-11-04 ENCOUNTER — Other Ambulatory Visit: Payer: Self-pay | Admitting: Family Medicine

## 2020-11-04 DIAGNOSIS — E119 Type 2 diabetes mellitus without complications: Secondary | ICD-10-CM

## 2020-11-04 DIAGNOSIS — E559 Vitamin D deficiency, unspecified: Secondary | ICD-10-CM

## 2020-11-04 MED ORDER — VITAMIN D (ERGOCALCIFEROL) 1.25 MG (50000 UNIT) PO CAPS: 50000.0000 [IU] | ORAL_CAPSULE | ORAL | 1 refills | Status: DC

## 2020-11-04 NOTE — Progress Notes (Signed)
Vitamin D sent in 

## 2020-11-11 ENCOUNTER — Other Ambulatory Visit: Payer: Self-pay | Admitting: Family Medicine

## 2020-11-11 DIAGNOSIS — I10 Essential (primary) hypertension: Secondary | ICD-10-CM

## 2020-11-27 DIAGNOSIS — D225 Melanocytic nevi of trunk: Secondary | ICD-10-CM | POA: Diagnosis not present

## 2020-11-27 DIAGNOSIS — Z1283 Encounter for screening for malignant neoplasm of skin: Secondary | ICD-10-CM | POA: Diagnosis not present

## 2020-11-27 DIAGNOSIS — L570 Actinic keratosis: Secondary | ICD-10-CM | POA: Diagnosis not present

## 2020-11-27 DIAGNOSIS — X32XXXD Exposure to sunlight, subsequent encounter: Secondary | ICD-10-CM | POA: Diagnosis not present

## 2021-01-15 ENCOUNTER — Other Ambulatory Visit: Payer: Self-pay | Admitting: Family Medicine

## 2021-01-15 DIAGNOSIS — E785 Hyperlipidemia, unspecified: Secondary | ICD-10-CM

## 2021-02-11 ENCOUNTER — Other Ambulatory Visit: Payer: PPO

## 2021-02-12 ENCOUNTER — Other Ambulatory Visit (INDEPENDENT_AMBULATORY_CARE_PROVIDER_SITE_OTHER): Payer: PPO

## 2021-02-12 ENCOUNTER — Other Ambulatory Visit: Payer: Self-pay

## 2021-02-12 DIAGNOSIS — E559 Vitamin D deficiency, unspecified: Secondary | ICD-10-CM | POA: Diagnosis not present

## 2021-02-12 DIAGNOSIS — E119 Type 2 diabetes mellitus without complications: Secondary | ICD-10-CM

## 2021-02-12 LAB — COMPREHENSIVE METABOLIC PANEL
ALT: 39 U/L — ABNORMAL HIGH (ref 0–35)
AST: 24 U/L (ref 0–37)
Albumin: 4 g/dL (ref 3.5–5.2)
Alkaline Phosphatase: 83 U/L (ref 39–117)
BUN: 17 mg/dL (ref 6–23)
CO2: 31 mEq/L (ref 19–32)
Calcium: 9.2 mg/dL (ref 8.4–10.5)
Chloride: 106 mEq/L (ref 96–112)
Creatinine, Ser: 0.83 mg/dL (ref 0.40–1.20)
GFR: 73.63 mL/min (ref >60.00)
Glucose, Bld: 103 mg/dL — ABNORMAL HIGH (ref 70–99)
Potassium: 4.9 mEq/L (ref 3.5–5.1)
Sodium: 142 mEq/L (ref 135–145)
Total Bilirubin: 0.4 mg/dL (ref 0.2–1.2)
Total Protein: 6.5 g/dL (ref 6.0–8.3)

## 2021-02-12 LAB — LIPID PANEL
Cholesterol: 98 mg/dL (ref 0–200)
HDL: 33.6 mg/dL — ABNORMAL LOW (ref >39.00)
LDL Cholesterol: 52 mg/dL (ref 0–99)
NonHDL: 64.42
Total CHOL/HDL Ratio: 3
Triglycerides: 62 mg/dL (ref 0.0–149.0)
VLDL: 12.4 mg/dL (ref 0.0–40.0)

## 2021-02-12 LAB — HEMOGLOBIN A1C: Hgb A1c MFr Bld: 6.4 % (ref 4.6–6.5)

## 2021-02-12 LAB — VITAMIN D 25 HYDROXY (VIT D DEFICIENCY, FRACTURES): VITD: 28.4 ng/mL — ABNORMAL LOW (ref 30.00–100.00)

## 2021-02-12 LAB — MICROALBUMIN / CREATININE URINE RATIO
Creatinine,U: 239.1 mg/dL
Microalb Creat Ratio: 1.4 mg/g (ref 0.0–30.0)
Microalb, Ur: 3.4 mg/dL — ABNORMAL HIGH (ref 0.0–1.9)

## 2021-02-15 ENCOUNTER — Encounter: Payer: Self-pay | Admitting: Family Medicine

## 2021-02-17 DIAGNOSIS — E119 Type 2 diabetes mellitus without complications: Secondary | ICD-10-CM | POA: Diagnosis not present

## 2021-02-17 DIAGNOSIS — H524 Presbyopia: Secondary | ICD-10-CM | POA: Diagnosis not present

## 2021-02-17 DIAGNOSIS — H2513 Age-related nuclear cataract, bilateral: Secondary | ICD-10-CM | POA: Diagnosis not present

## 2021-02-17 DIAGNOSIS — H5212 Myopia, left eye: Secondary | ICD-10-CM | POA: Diagnosis not present

## 2021-02-17 LAB — HM DIABETES EYE EXAM

## 2021-02-19 ENCOUNTER — Other Ambulatory Visit: Payer: Self-pay | Admitting: Family Medicine

## 2021-02-19 DIAGNOSIS — I10 Essential (primary) hypertension: Secondary | ICD-10-CM

## 2021-02-26 ENCOUNTER — Other Ambulatory Visit: Payer: Self-pay | Admitting: Obstetrics and Gynecology

## 2021-02-26 DIAGNOSIS — Z1231 Encounter for screening mammogram for malignant neoplasm of breast: Secondary | ICD-10-CM

## 2021-03-02 DIAGNOSIS — L218 Other seborrheic dermatitis: Secondary | ICD-10-CM | POA: Diagnosis not present

## 2021-03-02 DIAGNOSIS — L570 Actinic keratosis: Secondary | ICD-10-CM | POA: Diagnosis not present

## 2021-03-02 DIAGNOSIS — X32XXXD Exposure to sunlight, subsequent encounter: Secondary | ICD-10-CM | POA: Diagnosis not present

## 2021-04-05 ENCOUNTER — Encounter: Payer: Self-pay | Admitting: Family Medicine

## 2021-04-22 ENCOUNTER — Ambulatory Visit
Admission: RE | Admit: 2021-04-22 | Discharge: 2021-04-22 | Disposition: A | Discharge status: Home / Self Care | Payer: PPO | Source: Ambulatory Visit | Attending: Obstetrics and Gynecology | Admitting: Obstetrics and Gynecology

## 2021-04-22 ENCOUNTER — Other Ambulatory Visit: Payer: Self-pay

## 2021-04-22 DIAGNOSIS — Z1231 Encounter for screening mammogram for malignant neoplasm of breast: Secondary | ICD-10-CM

## 2021-04-29 ENCOUNTER — Encounter: Payer: Self-pay | Admitting: Family Medicine

## 2021-04-29 ENCOUNTER — Ambulatory Visit (INDEPENDENT_AMBULATORY_CARE_PROVIDER_SITE_OTHER): Payer: PPO | Admitting: Family Medicine

## 2021-04-29 ENCOUNTER — Other Ambulatory Visit: Payer: Self-pay

## 2021-04-29 VITALS — BP 108/68 | HR 74 | Temp 98.7°F | Resp 18 | Ht 62.0 in | Wt 184.6 lb

## 2021-04-29 DIAGNOSIS — I10 Essential (primary) hypertension: Secondary | ICD-10-CM

## 2021-04-29 DIAGNOSIS — E785 Hyperlipidemia, unspecified: Secondary | ICD-10-CM | POA: Diagnosis not present

## 2021-04-29 DIAGNOSIS — E119 Type 2 diabetes mellitus without complications: Secondary | ICD-10-CM

## 2021-04-29 DIAGNOSIS — E1169 Type 2 diabetes mellitus with other specified complication: Secondary | ICD-10-CM | POA: Diagnosis not present

## 2021-04-29 LAB — COMPREHENSIVE METABOLIC PANEL
ALT: 35 U/L (ref 0–35)
AST: 23 U/L (ref 0–37)
Albumin: 4 g/dL (ref 3.5–5.2)
Alkaline Phosphatase: 87 U/L (ref 39–117)
BUN: 18 mg/dL (ref 6–23)
CO2: 28 mEq/L (ref 19–32)
Calcium: 9.1 mg/dL (ref 8.4–10.5)
Chloride: 106 mEq/L (ref 96–112)
Creatinine, Ser: 0.79 mg/dL (ref 0.40–1.20)
GFR: 78.01 mL/min (ref >60.00)
Glucose, Bld: 109 mg/dL — ABNORMAL HIGH (ref 70–99)
Potassium: 4.8 mEq/L (ref 3.5–5.1)
Sodium: 141 mEq/L (ref 135–145)
Total Bilirubin: 0.3 mg/dL (ref 0.2–1.2)
Total Protein: 6.4 g/dL (ref 6.0–8.3)

## 2021-04-29 LAB — LIPID PANEL
Cholesterol: 117 mg/dL (ref 0–200)
HDL: 37.8 mg/dL — ABNORMAL LOW (ref >39.00)
LDL Cholesterol: 63 mg/dL (ref 0–99)
NonHDL: 79.35
Total CHOL/HDL Ratio: 3
Triglycerides: 80 mg/dL (ref 0.0–149.0)
VLDL: 16 mg/dL (ref 0.0–40.0)

## 2021-04-29 NOTE — Assessment & Plan Note (Signed)
Hold benicar for now bp check 2-3 weeks

## 2021-04-29 NOTE — Assessment & Plan Note (Signed)
Tolerating statin, encouraged heart healthy diet, avoid trans fats, minimize simple carbs and saturated fats. Increase exercise as tolerated 

## 2021-04-29 NOTE — Patient Instructions (Signed)

## 2021-04-29 NOTE — Assessment & Plan Note (Signed)
Lab Results  Component Value Date   HGBA1C 6.4 02/12/2021   con't diet -- watch starches and simple sugars

## 2021-04-29 NOTE — Progress Notes (Signed)
Subjective:   By signing my name below, I, Miranda Gonzales, attest that this documentation has been prepared under the direction and in the presence of Dr. Roma Schanz, DO. 04/29/2021     Patient ID: Miranda Gonzales, female    DOB: November 11, 1955, 65 y.o.   MRN: [id number]  Chief Complaint  Patient presents with   Hypertension   Hyperlipidemia   Follow-up    HPI Patient is in today for a office visit. She is doing well during this visit.  Her blood pressure is well controlled during this visit. She continues taking 10 mg benicar daily PO. She notes having occasional nausea while taking it. She does not measure her blood pressure at home.  She is taking chol med as well  No other complaints  BP Readings from Last 3 Encounters:  04/29/21 108/68  11/02/20 124/80  10/05/20 134/71   She is UTD on pneumonia vaccines. She is UTD on shingles vaccines. She has 3 Covid-19 vaccines and is willing to get the vaccine during the autumn season.  Past Medical History:  Diagnosis Date   Anxiety    GERD (gastroesophageal reflux disease)    Hyperlipidemia    Hypertension    Osteoporosis     Past Surgical History:  Procedure Laterality Date   COLONOSCOPY W/ POLYPECTOMY  04/2009   due 2015   COLONOSCOPY WITH PROPOFOL N/A 08/12/2014   Procedure: COLONOSCOPY WITH PROPOFOL;  Surgeon: Miranda Fair, MD;  Location: [facility];  Service: Endoscopy;  Laterality: N/A;   TUBAL LIGATION     Uterine polyps      Family History  Problem Relation Age of Onset   Cancer Mother        rectal   Diabetes Mother    Hypertension Mother    Stroke Mother        in 2s   Rectal cancer Mother    Colon polyps Father    Heart attack Father        23 & 76   Hyperlipidemia Sister    Hypertension Sister    Breast cancer Maternal Aunt    Colon cancer Neg Hx    Esophageal cancer Neg Hx    Ulcerative colitis Neg Hx     Social History   Socioeconomic History   Marital status: Married    Spouse  name: Not on file   Number of children: Not on file   Years of education: Not on file   Highest education level: Not on file  Occupational History   Occupation: retired Engineer, water  Tobacco Use   Smoking status: Never   Smokeless tobacco: Never  Vaping Use   Vaping Use: Never used  Substance and Sexual Activity   Alcohol use: No   Drug use: No   Sexual activity: Yes    Partners: Male  Other Topics Concern   Not on file  Social History Narrative   Not on file   Social Determinants of Health   Financial Resource Strain: Not on file  Food Insecurity: Not on file  Transportation Needs: Not on file  Physical Activity: Not on file  Stress: Not on file  Social Connections: Not on file  Intimate Partner Violence: Not on file    Outpatient Medications Prior to Visit  Medication Sig Dispense Refill   Acetylcarnitine HCl (ACETYL L-CARNITINE PO) Take 1 tablet by mouth daily.     Alpha-Lipoic Acid 300 MG CAPS Take by mouth.     b complex vitamins tablet  Take 1 tablet by mouth daily. B-50     CINNAMON PO Take 6,000 mg by mouth. [place] 6000mg  once daily     docusate sodium (COLACE) 100 MG capsule Take 100 mg by mouth daily.     escitalopram (LEXAPRO) 10 MG tablet Take 1 tablet (10 mg total) by mouth in the morning. 90 tablet 1   Lysine 500 MG TABS Take by mouth.     Nutritional Supplements (DHEA PO) Take by mouth daily.     OVER THE COUNTER MEDICATION Iron, one tablet daily.     OVER THE COUNTER MEDICATION Tumeric, one capsule daily.     OVER THE COUNTER MEDICATION Apple Cider Vinegar, one capsule daily.     OVER THE COUNTER MEDICATION Eye vitamin, doesn't know the name.     OVER THE COUNTER MEDICATION Chondrontin- glucosime,  Two tablets daily.     OVER THE COUNTER MEDICATION Magnesium, two tablets daily.     OVER THE COUNTER MEDICATION Calcium, 4 tablets daily.     OVER THE COUNTER MEDICATION Vitamin E, one capsule daily.     OVER THE COUNTER MEDICATION Beta carotene, one capsule daily.      OVER THE COUNTER MEDICATION Garlic, one tablet daily.     OVER THE COUNTER MEDICATION MSM- one tablet daily.     OVER THE COUNTER MEDICATION 100 mg. DIM 100mg  once daily     OVER THE COUNTER MEDICATION Take 200 mg by mouth daily. Chromium picolinate 200mg  once dayl     OVER THE COUNTER MEDICATION Sulforaphone 400mg  once daily     simvastatin (ZOCOR) 20 MG tablet TAKE 1 TABLET(20 MG) BY MOUTH AT BEDTIME 90 tablet 1   olmesartan (BENICAR) 20 MG tablet TAKE 1/2 TABLET(10 MG) BY MOUTH DAILY 45 tablet 1   Vitamin D, Ergocalciferol, (DRISDOL) 1.25 MG (50000 UNIT) CAPS capsule Take 1 capsule (50,000 Units total) by mouth every 7 (seven) days. 12 capsule 1   No facility-administered medications prior to visit.    Allergies  Allergen Reactions   Penicillins Itching and Rash   Bacitracin Hives    Review of Systems  Constitutional:  Negative for chills, fever and malaise/fatigue.  HENT:  Negative for congestion and hearing loss.   Eyes:  Negative for blurred vision and discharge.  Respiratory:  Negative for cough, sputum production and shortness of breath.   Cardiovascular:  Negative for chest pain, palpitations and leg swelling.  Gastrointestinal:  Negative for abdominal pain, blood in stool, constipation, diarrhea, heartburn, nausea and vomiting.  Genitourinary:  Negative for dysuria, frequency, hematuria and urgency.  Musculoskeletal:  Negative for back pain, falls and myalgias.  Skin:  Negative for rash.  Neurological:  Negative for dizziness, sensory change, loss of consciousness, weakness and headaches.  Endo/Heme/Allergies:  Negative for environmental allergies. Does not bruise/bleed easily.  Psychiatric/Behavioral:  Negative for depression and suicidal ideas. The patient is not nervous/anxious and does not have insomnia.       Objective:    Physical Exam Vitals and nursing note reviewed.  Constitutional:      General: She is not in acute distress.    Appearance: Normal  appearance. She is not ill-appearing.  HENT:     Head: Normocephalic and atraumatic.     Right Ear: External ear normal.     Left Ear: External ear normal.  Eyes:     Extraocular Movements: Extraocular movements intact.     Pupils: Pupils are equal, round, and reactive to light.  Cardiovascular:  Rate and Rhythm: Normal rate and regular rhythm.     Pulses: Normal pulses.     Heart sounds: Normal heart sounds. No murmur heard.   No gallop.  Pulmonary:     Effort: Pulmonary effort is normal. No respiratory distress.     Breath sounds: Normal breath sounds. No wheezing, rhonchi or rales.  Skin:    General: Skin is warm and dry.  Neurological:     Mental Status: She is alert and oriented to person, place, and time.  Psychiatric:        Behavior: Behavior normal.    BP 108/68 (BP Location: Right Arm, Patient Position: Sitting, Cuff Size: Normal)   Pulse 74   Temp 98.7 F (37.1 C) (Oral)   Resp 18   Ht 5\' 2"  (1.575 m)   Wt 184 lb 9.6 oz (83.7 kg)   SpO2 96%   BMI 33.76 kg/m  Wt Readings from Last 3 Encounters:  04/29/21 184 lb 9.6 oz (83.7 kg)  11/02/20 186 lb 3.2 oz (84.5 kg)  10/05/20 185 lb (83.9 kg)    Diabetic Foot Exam - Simple   No data filed    Lab Results  Component Value Date   WBC 8.4 10/05/2020   HGB 15.6 (H) 10/05/2020   HCT 49.3 (H) 10/05/2020   PLT 267 10/05/2020   GLUCOSE 103 (H) 02/12/2021   CHOL 98 02/12/2021   TRIG 62.0 02/12/2021   HDL 33.60 (L) 02/12/2021   LDLCALC 52 02/12/2021   ALT 39 (H) 02/12/2021   AST 24 02/12/2021   NA 142 02/12/2021   K 4.9 02/12/2021   CL 106 02/12/2021   CREATININE 0.83 02/12/2021   BUN 17 02/12/2021   CO2 31 02/12/2021   TSH 2.05 03/25/2015   HGBA1C 6.4 02/12/2021   MICROALBUR 3.4 (H) 02/12/2021    Lab Results  Component Value Date   TSH 2.05 03/25/2015   Lab Results  Component Value Date   WBC 8.4 10/05/2020   HGB 15.6 (H) 10/05/2020   HCT 49.3 (H) 10/05/2020   MCV 91.6 10/05/2020   PLT 267  10/05/2020   Lab Results  Component Value Date   NA 142 02/12/2021   K 4.9 02/12/2021   CO2 31 02/12/2021   GLUCOSE 103 (H) 02/12/2021   BUN 17 02/12/2021   CREATININE 0.83 02/12/2021   BILITOT 0.4 02/12/2021   ALKPHOS 83 02/12/2021   AST 24 02/12/2021   ALT 39 (H) 02/12/2021   PROT 6.5 02/12/2021   ALBUMIN 4.0 02/12/2021   CALCIUM 9.2 02/12/2021   ANIONGAP 5 10/05/2020   GFR 73.63 02/12/2021   Lab Results  Component Value Date   CHOL 98 02/12/2021   Lab Results  Component Value Date   HDL 33.60 (L) 02/12/2021   Lab Results  Component Value Date   LDLCALC 52 02/12/2021   Lab Results  Component Value Date   TRIG 62.0 02/12/2021   Lab Results  Component Value Date   CHOLHDL 3 02/12/2021   Lab Results  Component Value Date   HGBA1C 6.4 02/12/2021       Assessment & Plan:   Problem List Items Addressed This Visit       Unprioritized   Diet-controlled diabetes mellitus ([place])    Lab Results  Component Value Date   HGBA1C 6.4 02/12/2021  con't diet -- watch starches and simple sugars        Essential hypertension    Hold benicar for now bp check 2-3 weeks  Hyperlipidemia - Primary    Tolerating statin, encouraged heart healthy diet, avoid trans fats, minimize simple carbs and saturated fats. Increase exercise as tolerated       Relevant Orders   Lipid panel   Comprehensive metabolic panel   Hyperlipidemia associated with type 2 diabetes mellitus ([place])   Other Visit Diagnoses     Primary hypertension       Relevant Orders   Lipid panel   Comprehensive metabolic panel        No orders of the defined types were placed in this encounter.   I, Dr. Roma Schanz, DO, personally preformed the services described in this documentation.  All medical record entries made by the scribe were at my direction and in my presence.  I have reviewed the chart and discharge instructions (if applicable) and agree that the record reflects my  personal performance and is accurate and complete. 04/29/2021   I,Miranda Gonzales,acting as a scribe for Ann Held, DO.,have documented all relevant documentation on the behalf of Ann Held, DO,as directed by  Ann Held, DO while in the presence of Ann Held, DO.   Ann Held, DO

## 2021-05-03 ENCOUNTER — Ambulatory Visit: Payer: PPO | Admitting: Family Medicine

## 2021-05-20 ENCOUNTER — Other Ambulatory Visit: Payer: Self-pay

## 2021-05-20 ENCOUNTER — Ambulatory Visit: Payer: PPO

## 2021-05-20 VITALS — BP 133/71 | HR 72

## 2021-05-20 DIAGNOSIS — I10 Essential (primary) hypertension: Secondary | ICD-10-CM

## 2021-05-20 NOTE — Progress Notes (Signed)
BP Readings from Last 3 Encounters:  04/29/21 108/68  11/02/20 124/80  10/05/20 134/71     Patient here for bp check.  BP today is 133 71 with a pulse of 72  Patient's bp reader that she uses at home showes BP at 122/73 with a pulse 72. Per Dr. Etter Sjogren -Chase,continue same management and follow up in 6 month.  Patient also advised to contact us if BP starts to go back up.

## 2021-06-07 ENCOUNTER — Ambulatory Visit (INDEPENDENT_AMBULATORY_CARE_PROVIDER_SITE_OTHER): Payer: PPO | Admitting: Family Medicine

## 2021-06-07 ENCOUNTER — Encounter: Payer: Self-pay | Admitting: Family Medicine

## 2021-06-07 ENCOUNTER — Other Ambulatory Visit: Payer: Self-pay

## 2021-06-07 VITALS — BP 132/78 | HR 70 | Temp 99.0°F | Resp 18 | Ht 62.0 in | Wt 181.0 lb

## 2021-06-07 DIAGNOSIS — R1013 Epigastric pain: Secondary | ICD-10-CM | POA: Insufficient documentation

## 2021-06-07 LAB — CBC WITH DIFFERENTIAL/PLATELET
Basophils Absolute: 0.1 10*3/uL (ref 0.0–0.1)
Basophils Relative: 0.6 % (ref 0.0–3.0)
Eosinophils Absolute: 0.3 10*3/uL (ref 0.0–0.7)
Eosinophils Relative: 3.1 % (ref 0.0–5.0)
HCT: 41.6 % (ref 36.0–46.0)
Hemoglobin: 13.5 g/dL (ref 12.0–15.0)
Lymphocytes Relative: 21.2 % (ref 12.0–46.0)
Lymphs Abs: 1.9 10*3/uL (ref 0.7–4.0)
MCHC: 32.4 g/dL (ref 30.0–36.0)
MCV: 85.8 fl (ref 78.0–100.0)
Monocytes Absolute: 0.9 10*3/uL (ref 0.1–1.0)
Monocytes Relative: 10.3 % (ref 3.0–12.0)
Neutro Abs: 5.8 10*3/uL (ref 1.4–7.7)
Neutrophils Relative %: 64.8 % (ref 43.0–77.0)
Platelets: 266 10*3/uL (ref 150.0–400.0)
RBC: 4.85 Mil/uL (ref 3.87–5.11)
RDW: 13.4 % (ref 11.5–15.5)
WBC: 9 10*3/uL (ref 4.0–10.5)

## 2021-06-07 LAB — COMPREHENSIVE METABOLIC PANEL
ALT: 27 U/L (ref 0–35)
AST: 19 U/L (ref 0–37)
Albumin: 4 g/dL (ref 3.5–5.2)
Alkaline Phosphatase: 101 U/L (ref 39–117)
BUN: 15 mg/dL (ref 6–23)
CO2: 29 mEq/L (ref 19–32)
Calcium: 9.3 mg/dL (ref 8.4–10.5)
Chloride: 105 mEq/L (ref 96–112)
Creatinine, Ser: 0.75 mg/dL (ref 0.40–1.20)
GFR: 82.97 mL/min (ref >60.00)
Glucose, Bld: 85 mg/dL (ref 70–99)
Potassium: 4.4 mEq/L (ref 3.5–5.1)
Sodium: 141 mEq/L (ref 135–145)
Total Bilirubin: 0.4 mg/dL (ref 0.2–1.2)
Total Protein: 6.5 g/dL (ref 6.0–8.3)

## 2021-06-07 LAB — AMYLASE: Amylase: 48 U/L (ref 27–131)

## 2021-06-07 LAB — LIPASE: Lipase: 43 U/L (ref 11.0–59.0)

## 2021-06-07 MED ORDER — PANTOPRAZOLE SODIUM 40 MG PO TBEC: 40.0000 mg | DELAYED_RELEASE_TABLET | Freq: Every day | ORAL | 3 refills | Status: DC

## 2021-06-07 NOTE — Progress Notes (Signed)
Established Patient Office Visit  Subjective:  Patient ID: Miranda Gonzales, female    DOB: [birth year]  Age: 65 y.o. MRN: [id number]  CC:  Chief Complaint  Patient presents with  . GI Problem    Pt states having upper abd pain since last Tues. Pt states some dry heaving and nausa, no vomiting. No fever    HPI Miranda Gonzales presents for upper abd pain since Tuesday.  + dry heaving and nausea.  No vomiting , no fever.  Her mouth has been watering as well .   Tums helped a little bit.  Food does not seem to aggravate it .      Past Medical History:  Diagnosis Date  . Anxiety   . GERD (gastroesophageal reflux disease)   . Hyperlipidemia   . Hypertension   . Osteoporosis     Past Surgical History:  Procedure Laterality Date  . COLONOSCOPY W/ POLYPECTOMY  04/2009   due 2015  . COLONOSCOPY WITH PROPOFOL N/A 08/12/2014   Procedure: COLONOSCOPY WITH PROPOFOL;  Surgeon: Garlan Fair, MD;  Location: [facility];  Service: Endoscopy;  Laterality: N/A;  . TUBAL LIGATION    . Uterine polyps      Family History  Problem Relation Age of Onset  . Cancer Mother        rectal  . Diabetes Mother   . Hypertension Mother   . Stroke Mother        in 62s  . Rectal cancer Mother   . Colon polyps Father   . Heart attack Father        60 & 82  . Hyperlipidemia Sister   . Hypertension Sister   . Breast cancer Maternal Aunt   . Colon cancer Neg Hx   . Esophageal cancer Neg Hx   . Ulcerative colitis Neg Hx     Social History   Socioeconomic History  . Marital status: Married    Spouse name: Not on file  . Number of children: Not on file  . Years of education: Not on file  . Highest education level: Not on file  Occupational History  . Occupation: retired Engineer, water  Tobacco Use  . Smoking status: Never  . Smokeless tobacco: Never  Vaping Use  . Vaping Use: Never used  Substance and Sexual Activity  . Alcohol use: No  . Drug use: No  . Sexual activity: Yes    Partners: Male   Other Topics Concern  . Not on file  Social History Narrative  . Not on file   Social Determinants of Health   Financial Resource Strain: Not on file  Food Insecurity: Not on file  Transportation Needs: Not on file  Physical Activity: Not on file  Stress: Not on file  Social Connections: Not on file  Intimate Partner Violence: Not on file    Outpatient Medications Prior to Visit  Medication Sig Dispense Refill  . Acetylcarnitine HCl (ACETYL L-CARNITINE PO) Take 1 tablet by mouth daily.    . Alpha-Lipoic Acid 300 MG CAPS Take by mouth.    Marland Kitchen b complex vitamins tablet Take 1 tablet by mouth daily. B-50    . CINNAMON PO Take 6,000 mg by mouth. [place]ylon '6000mg'$  once daily    . docusate sodium (COLACE) 100 MG capsule Take 100 mg by mouth daily.    Marland Kitchen escitalopram (LEXAPRO) 10 MG tablet Take 1 tablet (10 mg total) by mouth in the morning. 90 tablet 1  . Lysine 500  MG TABS Take by mouth.    . Nutritional Supplements (DHEA PO) Take by mouth daily.    Marland Kitchen OVER THE COUNTER MEDICATION Iron, one tablet daily.    Marland Kitchen OVER THE COUNTER MEDICATION Tumeric, one capsule daily.    Marland Kitchen OVER THE COUNTER MEDICATION Apple Cider Vinegar, one capsule daily.    Marland Kitchen OVER THE COUNTER MEDICATION Eye vitamin, doesn't know the name.    Marland Kitchen OVER THE COUNTER MEDICATION Chondrontin- glucosime,  Two tablets daily.    Marland Kitchen OVER THE COUNTER MEDICATION Magnesium, two tablets daily.    Marland Kitchen OVER THE COUNTER MEDICATION Calcium, 4 tablets daily.    Marland Kitchen OVER THE COUNTER MEDICATION Vitamin E, one capsule daily.    Marland Kitchen OVER THE COUNTER MEDICATION Beta carotene, one capsule daily.    Marland Kitchen OVER THE COUNTER MEDICATION Garlic, one tablet daily.    Marland Kitchen OVER THE COUNTER MEDICATION MSM- one tablet daily.    Marland Kitchen OVER THE COUNTER MEDICATION 100 mg. DIM '100mg'$  once daily    . OVER THE COUNTER MEDICATION Take 200 mg by mouth daily. Chromium picolinate '200mg'$  once dayl    . OVER THE COUNTER MEDICATION Sulforaphone '400mg'$  once daily    . simvastatin (ZOCOR) 20 MG  tablet TAKE 1 TABLET(20 MG) BY MOUTH AT BEDTIME 90 tablet 1   No facility-administered medications prior to visit.    Allergies  Allergen Reactions  . Penicillins Itching and Rash  . Bacitracin Hives    ROS Review of Systems  Constitutional:  Negative for appetite change, diaphoresis, fatigue and unexpected weight change.  Eyes:  Negative for pain, redness and visual disturbance.  Respiratory:  Negative for cough, chest tightness, shortness of breath and wheezing.   Cardiovascular:  Negative for chest pain, palpitations and leg swelling.  Gastrointestinal:  Positive for abdominal pain and nausea. Negative for abdominal distention, anal bleeding, blood in stool, constipation, diarrhea, rectal pain and vomiting.  Endocrine: Negative for cold intolerance, heat intolerance, polydipsia, polyphagia and polyuria.  Genitourinary:  Negative for difficulty urinating, dysuria and frequency.  Neurological:  Negative for dizziness, light-headedness, numbness and headaches.     Objective:    Physical Exam Vitals and nursing note reviewed.  Constitutional:      Appearance: She is well-developed.  HENT:     Head: Normocephalic and atraumatic.  Eyes:     Conjunctiva/sclera: Conjunctivae normal.  Neck:     Thyroid: No thyromegaly.     Vascular: No carotid bruit or JVD.  Cardiovascular:     Rate and Rhythm: Normal rate and regular rhythm.     Heart sounds: Normal heart sounds. No murmur heard. Pulmonary:     Effort: Pulmonary effort is normal. No respiratory distress.     Breath sounds: Normal breath sounds. No wheezing or rales.  Chest:     Chest wall: No tenderness.  Abdominal:     General: There is no distension.     Palpations: There is no mass.     Tenderness: There is abdominal tenderness in the epigastric area. There is no guarding or rebound.     Hernia: No hernia is present.  Musculoskeletal:     Cervical back: Normal range of motion and neck supple.  Neurological:      Mental Status: She is alert and oriented to person, place, and time.   BP 132/78 (BP Location: Left Arm, Patient Position: Sitting, Cuff Size: Normal)   Pulse 70   Temp 99 F (37.2 C) (Oral)   Resp 18   Ht  $'5\' 2"'C$  (1.575 m)   Wt 187 lb (84.8 kg)   SpO2 97%   BMI 34.20 kg/m  Wt Readings from Last 3 Encounters:  06/07/21 187 lb (84.8 kg)  04/29/21 184 lb 9.6 oz (83.7 kg)  11/02/20 186 lb 3.2 oz (84.5 kg)     Health Maintenance Due  Topic Date Due  . COVID-19 Vaccine (4 - Booster for Pfizer series) 12/27/2020  . INFLUENZA VACCINE  05/24/2021    There are no preventive care reminders to display for this patient.  Lab Results  Component Value Date   TSH 2.05 03/25/2015   Lab Results  Component Value Date   WBC 8.4 10/05/2020   HGB 15.6 (H) 10/05/2020   HCT 49.3 (H) 10/05/2020   MCV 91.6 10/05/2020   PLT 267 10/05/2020   Lab Results  Component Value Date   NA 141 04/29/2021   K 4.8 04/29/2021   CO2 28 04/29/2021   GLUCOSE 109 (H) 04/29/2021   BUN 18 04/29/2021   CREATININE 0.79 04/29/2021   BILITOT 0.3 04/29/2021   ALKPHOS 87 04/29/2021   AST 23 04/29/2021   ALT 35 04/29/2021   PROT 6.4 04/29/2021   ALBUMIN 4.0 04/29/2021   CALCIUM 9.1 04/29/2021   ANIONGAP 5 10/05/2020   GFR 78.01 04/29/2021   Lab Results  Component Value Date   CHOL 117 04/29/2021   Lab Results  Component Value Date   HDL 37.80 (L) 04/29/2021   Lab Results  Component Value Date   LDLCALC 63 04/29/2021   Lab Results  Component Value Date   TRIG 80.0 04/29/2021   Lab Results  Component Value Date   CHOLHDL 3 04/29/2021   Lab Results  Component Value Date   HGBA1C 6.4 02/12/2021      Assessment & Plan:   Problem List Items Addressed This Visit   None Visit Diagnoses     Abdominal pain, epigastric    -  Primary   Relevant Medications   pantoprazole (PROTONIX) 40 MG tablet   Other Relevant Orders   CBC with Differential/Platelet   H. pylori antibody, IgG    Comprehensive metabolic panel   Lipase   Amylase       Meds ordered this encounter  Medications  . pantoprazole (PROTONIX) 40 MG tablet    Sig: Take 1 tablet (40 mg total) by mouth daily.    Dispense:  30 tablet    Refill:  3    Follow-up: Return if symptoms worsen or fail to improve.    Ann Held, DO

## 2021-06-07 NOTE — Patient Instructions (Signed)
Food Choices for Gastroesophageal Reflux Disease, Adult °When you have gastroesophageal reflux disease (GERD), the foods you eat and your eating habits are very important. Choosing the right foods can help ease the discomfort of GERD. Consider working with a dietitian to help you make healthy food choices. °What are tips for following this plan? °Reading food labels °Look for foods that are low in saturated fat. Foods that have less than 5% of daily value (DV) of fat and 0 g of trans fats may help with your symptoms. °Cooking °Cook foods using methods other than frying. This may include baking, steaming, grilling, or broiling. These are all methods that do not need a lot of fat for cooking. °To add flavor, try to use herbs that are low in spice and acidity. °Meal planning ° °Choose healthy foods that are low in fat, such as fruits, vegetables, whole grains, low-fat dairy products, lean meats, fish, and poultry. °Eat frequent, small meals instead of three large meals each day. Eat your meals slowly, in a relaxed setting. Avoid bending over or lying down until 2-3 hours after eating. °Limit high-fat foods such as fatty meats or fried foods. °Limit your intake of fatty foods, such as oils, butter, and shortening. °Avoid the following as told by your health care provider: °Foods that cause symptoms. These may be different for different people. Keep a food diary to keep track of foods that cause symptoms. °Alcohol. °Drinking large amounts of liquid with meals. °Eating meals during the 2-3 hours before bed. °Lifestyle °Maintain a healthy weight. Ask your health care provider what weight is healthy for you. If you need to lose weight, work with your health care provider to do so safely. °Exercise for at least 30 minutes on 5 or more days each week, or as told by your health care provider. °Avoid wearing clothes that fit tightly around your waist and chest. °Do not use any products that contain nicotine or tobacco. These  products include cigarettes, chewing tobacco, and vaping devices, such as e-cigarettes. If you need help quitting, ask your health care provider. °Sleep with the head of your bed raised. Use a wedge under the mattress or blocks under the bed frame to raise the head of the bed. °Chew sugar-free gum after mealtimes. °What foods should I eat? °Eat a healthy, well-balanced diet of fruits, vegetables, whole grains, low-fat dairy products, lean meats, fish, and poultry. Each person is different. Foods that may trigger symptoms in one person may not trigger any symptoms in another person. Work with your health care provider to identify foods that are safe for you. °The items listed above may not be a complete list of recommended foods and beverages. Contact a dietitian for more information. °What foods should I avoid? °Limiting some of these foods may help manage the symptoms of GERD. Everyone is different. Consult a dietitian or your health care provider to help you identify the exact foods to avoid, if any. °Fruits °Any fruits prepared with added fat. Any fruits that cause symptoms. For some people this may include citrus fruits, such as oranges, grapefruit, pineapple, and lemons. °Vegetables °Deep-fried vegetables. French fries. Any vegetables prepared with added fat. Any vegetables that cause symptoms. For some people, this may include tomatoes and tomato products, chili peppers, onions and garlic, and horseradish. °Grains °Pastries or quick breads with added fat. °Meats and other proteins °High-fat meats, such as fatty beef or pork, hot dogs, ribs, ham, sausage, salami, and bacon. Fried meat or protein, including fried   fish and fried chicken. Nuts and nut butters, in large amounts. °Dairy °Whole milk and chocolate milk. Sour cream. Cream. Ice cream. Cream cheese. Milkshakes. °Fats and oils °Butter. Margarine. Shortening. Ghee. °Beverages °Coffee and tea, with or without caffeine. Carbonated beverages. Sodas. Energy  drinks. Fruit juice made with acidic fruits, such as orange or grapefruit. Tomato juice. Alcoholic drinks. °Sweets and desserts °Chocolate and cocoa. Donuts. °Seasonings and condiments °Pepper. Peppermint and spearmint. Added salt. Any condiments, herbs, or seasonings that cause symptoms. For some people, this may include curry, hot sauce, or vinegar-based salad dressings. °The items listed above may not be a complete list of foods and beverages to avoid. Contact a dietitian for more information. °Questions to ask your health care provider °Diet and lifestyle changes are usually the first steps that are taken to manage symptoms of GERD. If diet and lifestyle changes do not improve your symptoms, talk with your health care provider about taking medicines. °Where to find more information °[employer] G[employer]intestinal [employer]: aboutgerd.org °Summary °When you have gastroesophageal reflux disease (GERD), food and lifestyle choices may be very helpful in easing the discomfort of GERD. °Eat frequent, small meals instead of three large meals each day. Eat your meals slowly, in a relaxed setting. Avoid bending over or lying down until 2-3 hours after eating. °Limit high-fat foods such as fatty meats or fried foods. °This information is not intended to replace advice given to you by your health care provider. Make sure you discuss any questions you have with your health care provider. °Document Revised: 04/20/2020 Document Reviewed: 04/20/2020 °Elsevier Patient Education © 2022 [employer] ° °

## 2021-06-07 NOTE — Assessment & Plan Note (Signed)
protonix daily  gerd diet ho given to pt  Refer to GI  Check labs

## 2021-06-08 LAB — H. PYLORI ANTIBODY, IGG: H Pylori IgG: NEGATIVE

## 2021-06-11 ENCOUNTER — Encounter: Payer: Self-pay | Admitting: Family Medicine

## 2021-06-11 MED ORDER — ESCITALOPRAM OXALATE 10 MG PO TABS: 10.0000 mg | ORAL_TABLET | Freq: Every morning | ORAL | 1 refills | Status: DC

## 2021-07-21 ENCOUNTER — Encounter: Payer: Self-pay | Admitting: Family Medicine

## 2021-07-21 DIAGNOSIS — E785 Hyperlipidemia, unspecified: Secondary | ICD-10-CM

## 2021-07-21 MED ORDER — SIMVASTATIN 20 MG PO TABS: ORAL_TABLET | ORAL | 1 refills | Status: DC

## 2021-08-05 ENCOUNTER — Encounter: Payer: Self-pay | Admitting: Physician Assistant

## 2021-08-05 ENCOUNTER — Other Ambulatory Visit: Payer: Self-pay

## 2021-08-05 ENCOUNTER — Ambulatory Visit (INDEPENDENT_AMBULATORY_CARE_PROVIDER_SITE_OTHER): Payer: PPO | Admitting: Physician Assistant

## 2021-08-05 VITALS — BP 136/74 | HR 82 | Temp 97.8°F | Ht 62.0 in | Wt 174.2 lb

## 2021-08-05 DIAGNOSIS — R0789 Other chest pain: Secondary | ICD-10-CM

## 2021-08-05 MED ORDER — VALACYCLOVIR HCL 1 G PO TABS: 1000.0000 mg | ORAL_TABLET | Freq: Three times a day (TID) | ORAL | 0 refills | Status: DC

## 2021-08-05 MED ORDER — METHYLPREDNISOLONE 4 MG PO TBPK: ORAL_TABLET | ORAL | 0 refills | Status: DC

## 2021-08-05 NOTE — Patient Instructions (Addendum)
Please call with updates. I think this may be muscular, POSSIBLY early shingles. Start on Medrol dose pak and Valtrex as directed. Avoid heavy lifting. Your EKG looked great.   Straight to the ED if any sudden pressure pain, shortness of breath, or changes with exertion.

## 2021-08-05 NOTE — Progress Notes (Signed)
Subjective:    Patient ID: Miranda Gonzales, female    DOB: 07/21/1956, 65 y.o.   MRN: [id number]  Chief Complaint  Patient presents with   Breast Pain    Radiates to back on left side     HPI Patient is in today for left chest pain since 08/02/21.  Constant pain, cannot get comfortable. Not sharp. Feels like a dull, strong ache. Goes into her upper back. Some itchy pain around her breast. Currently 6/10 pain. Coughing worsens it some. ROM does not seem to worsen it. 4 ibuprofen does help some. Last night two Tylenol. No pain down left arm. No SOB. Some nausea, no vomiting. No known injury, but says she has lifted her 40 lb dog recently. No HA or dizziness.   Non-smoker. Father has hx of MI - first around age 39.  Pt is no longer taking any blood pressure medication. She does take Zocor 20 mg for cholesterol.  No personal hx of cardiac, pulmonary, or cancer issues.  No recent COVID-19 illness or vaccines.    Past Medical History:  Diagnosis Date   Anxiety    GERD (gastroesophageal reflux disease)    Hyperlipidemia    Hypertension    Osteoporosis     Past Surgical History:  Procedure Laterality Date   COLONOSCOPY W/ POLYPECTOMY  04/2009   due 2015   COLONOSCOPY WITH PROPOFOL N/A 08/12/2014   Procedure: COLONOSCOPY WITH PROPOFOL;  Surgeon: Garlan Fair, MD;  Location: [facility];  Service: Endoscopy;  Laterality: N/A;   TUBAL LIGATION     Uterine polyps      Family History  Problem Relation Age of Onset   Cancer Mother        rectal   Diabetes Mother    Hypertension Mother    Stroke Mother        in 27s   Rectal cancer Mother    Colon polyps Father    Heart attack Father        43 & 77   Hyperlipidemia Sister    Hypertension Sister    Breast cancer Maternal Aunt    Colon cancer Neg Hx    Esophageal cancer Neg Hx    Ulcerative colitis Neg Hx     Social History   Tobacco Use   Smoking status: Never   Smokeless tobacco: Never  Vaping Use   Vaping  Use: Never used  Substance Use Topics   Alcohol use: No   Drug use: No     Allergies  Allergen Reactions   Penicillins Itching and Rash   Bacitracin Hives    Review of Systems REFER TO HPI FOR PERTINENT POSITIVES AND NEGATIVES      Objective:     BP 136/74   Pulse 82   Temp 97.8 F (36.6 C)   Ht 5\' 2"  (1.575 m)   Wt 174 lb 3.2 oz (79 kg)   SpO2 97%   BMI 31.86 kg/m   Wt Readings from Last 3 Encounters:  08/05/21 174 lb 3.2 oz (79 kg)  06/07/21 181 lb (82.1 kg)  04/29/21 184 lb 9.6 oz (83.7 kg)    BP Readings from Last 3 Encounters:  08/05/21 136/74  06/07/21 132/78  05/20/21 133/71     Physical Exam Vitals and nursing note reviewed.  Constitutional:      Appearance: Normal appearance. She is normal weight. She is not toxic-appearing.  HENT:     Head: Normocephalic and atraumatic.  Right Ear: External ear normal.     Left Ear: External ear normal.     Nose: Nose normal.     Mouth/Throat:     Mouth: Mucous membranes are moist.  Eyes:     Extraocular Movements: Extraocular movements intact.     Conjunctiva/sclera: Conjunctivae normal.     Pupils: Pupils are equal, round, and reactive to light.  Cardiovascular:     Rate and Rhythm: Normal rate and regular rhythm.     Pulses: Normal pulses.     Heart sounds: Normal heart sounds.  Pulmonary:     Effort: Pulmonary effort is normal.     Breath sounds: Normal breath sounds.  Chest:    Abdominal:     General: Abdomen is flat. Bowel sounds are normal.     Palpations: Abdomen is soft.  Musculoskeletal:        General: Normal range of motion.     Cervical back: Normal range of motion and neck supple.       Back:  Skin:    General: Skin is warm and dry.  Neurological:     General: No focal deficit present.     Mental Status: She is alert and oriented to person, place, and time.  Psychiatric:        Mood and Affect: Mood normal.        Behavior: Behavior normal.        Thought Content: Thought  content normal.        Judgment: Judgment normal.       Assessment & Plan:   Problem List Items Addressed This Visit   None Visit Diagnoses     Chest wall pain    -  Primary   Relevant Orders   EKG 12-Lead (Completed)        Meds ordered this encounter  Medications   methylPREDNISolone (MEDROL DOSEPAK) 4 MG TBPK tablet    Sig: Please take per packaging instructions.    Dispense:  21 tablet    Refill:  0   valACYclovir (VALTREX) 1000 MG tablet    Sig: Take 1 tablet (1,000 mg total) by mouth 3 (three) times daily. For 7 days for shingles.    Dispense:  21 tablet    Refill:  0    1. Chest wall pain -Pain is reproducible on exam, which leads me to believe this is more muscular in etiology instead of cardiac. Also discussed possibly early shingles without rash. -ECG performed for reassurance and was found to be NSR w/o ST or T wave changes, personally read by me.  -She will start on Medrol Dosepak and Valtrex as directed. - She is going to avoid heavy lifting. - She knows warning signs of when to go to the emergency department. - She is going to call with updates.  This note was prepared with assistance of Systems analyst. Occasional wrong-word or sound-a-like substitutions may have occurred due to the inherent limitations of voice recognition software.  In addition to time spent for ekg, I spent 32 minutes of total time on the date of the encounter performing the following actions: chart review prior to seeing the patient, obtaining history, performing a medically necessary exam, counseling on the treatment plan, placing orders, and documenting in our EHR.     Charnice Zwilling M Rolonda Pontarelli, PA-C

## 2021-08-06 ENCOUNTER — Telehealth: Payer: Self-pay

## 2021-08-06 NOTE — Telephone Encounter (Signed)
Patient called stating that [facility] informed her to call in today to give a status update on how she is feeling.  Patient states she is feeling much better today.

## 2021-08-06 NOTE — Telephone Encounter (Signed)
Noted. Thanks.

## 2021-08-12 ENCOUNTER — Encounter: Payer: Self-pay | Admitting: Physician Assistant

## 2021-08-13 ENCOUNTER — Encounter: Payer: Self-pay | Admitting: Physician Assistant

## 2021-08-13 ENCOUNTER — Other Ambulatory Visit: Payer: Self-pay

## 2021-08-13 ENCOUNTER — Ambulatory Visit (INDEPENDENT_AMBULATORY_CARE_PROVIDER_SITE_OTHER): Payer: PPO | Admitting: Physician Assistant

## 2021-08-13 VITALS — BP 121/77 | HR 74 | Temp 97.6°F | Ht 62.0 in | Wt 177.2 lb

## 2021-08-13 DIAGNOSIS — B029 Zoster without complications: Secondary | ICD-10-CM | POA: Diagnosis not present

## 2021-08-13 MED ORDER — VALACYCLOVIR HCL 1 G PO TABS: 1000.0000 mg | ORAL_TABLET | Freq: Three times a day (TID) | ORAL | 0 refills | Status: DC

## 2021-08-13 MED ORDER — PREGABALIN 25 MG PO CAPS: 25.0000 mg | ORAL_CAPSULE | Freq: Two times a day (BID) | ORAL | 0 refills | Status: DC

## 2021-08-13 NOTE — Progress Notes (Signed)
Subjective:    Patient ID: Miranda Gonzales, female    DOB: [birth year], 65 y.o.   MRN: [id number]  No chief complaint on file.   HPI Patient is in today for recheck from 08/05/21. States that she has started getting a few red bumps showing up around the area of pain on her chest wall. Still having burning/ sharp / tingling pain. She has had her Shingrix vaccines. No other symptoms.   Past Medical History:  Diagnosis Date   Anxiety    GERD (gastroesophageal reflux disease)    Hyperlipidemia    Hypertension    Osteoporosis     Past Surgical History:  Procedure Laterality Date   COLONOSCOPY W/ POLYPECTOMY  04/2009   due 2015   COLONOSCOPY WITH PROPOFOL N/A 08/12/2014   Procedure: COLONOSCOPY WITH PROPOFOL;  Surgeon: Garlan Fair, MD;  Location: [facility];  Service: Endoscopy;  Laterality: N/A;   TUBAL LIGATION     Uterine polyps      Family History  Problem Relation Age of Onset   Cancer Mother        rectal   Diabetes Mother    Hypertension Mother    Stroke Mother        in 44s   Rectal cancer Mother    Colon polyps Father    Heart attack Father        18 & 2   Hyperlipidemia Sister    Hypertension Sister    Breast cancer Maternal Aunt    Colon cancer Neg Hx    Esophageal cancer Neg Hx    Ulcerative colitis Neg Hx     Social History   Tobacco Use   Smoking status: Never   Smokeless tobacco: Never  Vaping Use   Vaping Use: Never used  Substance Use Topics   Alcohol use: No   Drug use: No     Allergies  Allergen Reactions   Penicillins Itching and Rash   Bacitracin Hives    Review of Systems REFER TO HPI FOR PERTINENT POSITIVES AND NEGATIVES      Objective:     BP 121/77   Pulse 74   Temp 97.6 F (36.4 C)   Ht 5\' 2"  (1.575 m)   Wt 177 lb 3.2 oz (80.4 kg)   SpO2 98%   BMI 32.41 kg/m   Wt Readings from Last 3 Encounters:  08/13/21 177 lb 3.2 oz (80.4 kg)  08/05/21 174 lb 3.2 oz (79 kg)  06/07/21 181 lb (82.1 kg)    BP  Readings from Last 3 Encounters:  08/13/21 121/77  08/05/21 136/74  06/07/21 132/78     Physical Exam Vitals and nursing note reviewed.  Constitutional:      Appearance: Normal appearance.  Skin:         Comments: Red marks on drawing denote red papules on erythematous base along chest wall dermatome   Neurological:     Mental Status: She is alert.       Assessment & Plan:   Problem List Items Addressed This Visit   None Visit Diagnoses     Herpes zoster without complication    -  Primary   Relevant Medications   valACYclovir (VALTREX) 1000 MG tablet        Meds ordered this encounter  Medications   pregabalin (LYRICA) 25 MG capsule    Sig: Take 1 capsule (25 mg total) by mouth 2 (two) times daily for 15 days.    Dispense:  30 capsule    Refill:  0   valACYclovir (VALTREX) 1000 MG tablet    Sig: Take 1 tablet (1,000 mg total) by mouth 3 (three) times daily. For 7 days for shingles.    Dispense:  21 tablet    Refill:  0   1. Herpes zoster without complication -Confirmed with patient this has been shingles pain. -She previously completed prednisone course, which helped the pain, but caused her to be very hungry. She completed Valtrex as well. -With new rash showing up, will attempt another course of valtrex for shingles. -Will also try Pregabalin for nerve pain one to two times daily. -She will keep me updated how she is doing. -Handout provided.  This note was prepared with assistance of Systems analyst. Occasional wrong-word or sound-a-like substitutions may have occurred due to the inherent limitations of voice recognition software.  Time Spent: 24 minutes of total time was spent on the date of the encounter performing the following actions: chart review prior to seeing the patient, obtaining history, performing a medically necessary exam, counseling on the treatment plan, placing orders, and documenting in our EHR.    Bianey Tesoro M Burnell Matlin,  PA-C

## 2021-08-13 NOTE — Patient Instructions (Signed)
Please keep me updated on how you are doing!

## 2021-08-16 DIAGNOSIS — M816 Localized osteoporosis [Lequesne]: Secondary | ICD-10-CM | POA: Diagnosis not present

## 2021-08-16 DIAGNOSIS — M810 Age-related osteoporosis without current pathological fracture: Secondary | ICD-10-CM | POA: Diagnosis not present

## 2021-08-16 DIAGNOSIS — N952 Postmenopausal atrophic vaginitis: Secondary | ICD-10-CM | POA: Diagnosis not present

## 2021-08-16 DIAGNOSIS — N958 Other specified menopausal and perimenopausal disorders: Secondary | ICD-10-CM | POA: Diagnosis not present

## 2021-08-16 DIAGNOSIS — Z6832 Body mass index (BMI) 32.0-32.9, adult: Secondary | ICD-10-CM | POA: Diagnosis not present

## 2021-08-16 DIAGNOSIS — Z01419 Encounter for gynecological examination (general) (routine) without abnormal findings: Secondary | ICD-10-CM | POA: Diagnosis not present

## 2021-08-18 ENCOUNTER — Other Ambulatory Visit: Payer: Self-pay | Admitting: Family Medicine

## 2021-08-18 DIAGNOSIS — R1013 Epigastric pain: Secondary | ICD-10-CM

## 2021-09-07 IMAGING — MG DIGITAL SCREENING BILAT W/ TOMO W/ CAD
6 of 10 series · 6 of 30 positions shown · non-contrast
Comparison: Previous exam(s).

CLINICAL DATA: Screening.

EXAM:
DIGITAL SCREENING BILATERAL MAMMOGRAM WITH TOMO AND CAD

[L CC synth-2D]
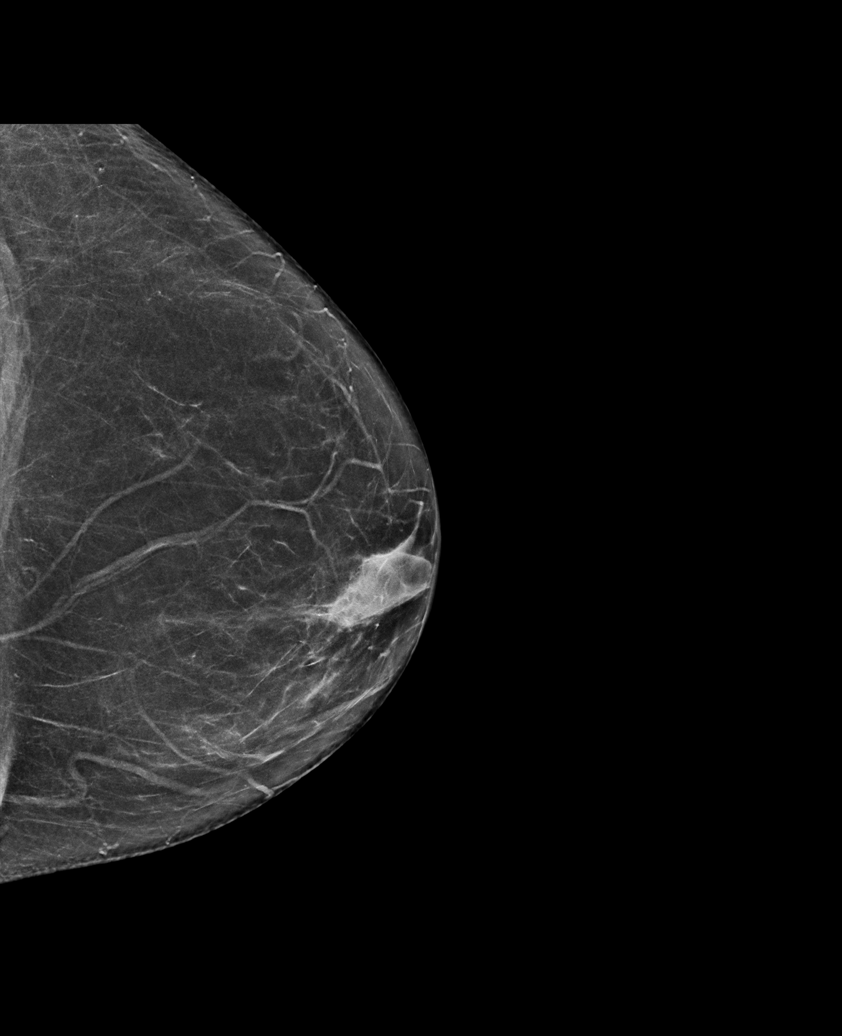

[L MLO synth-2D (1 of 2)]
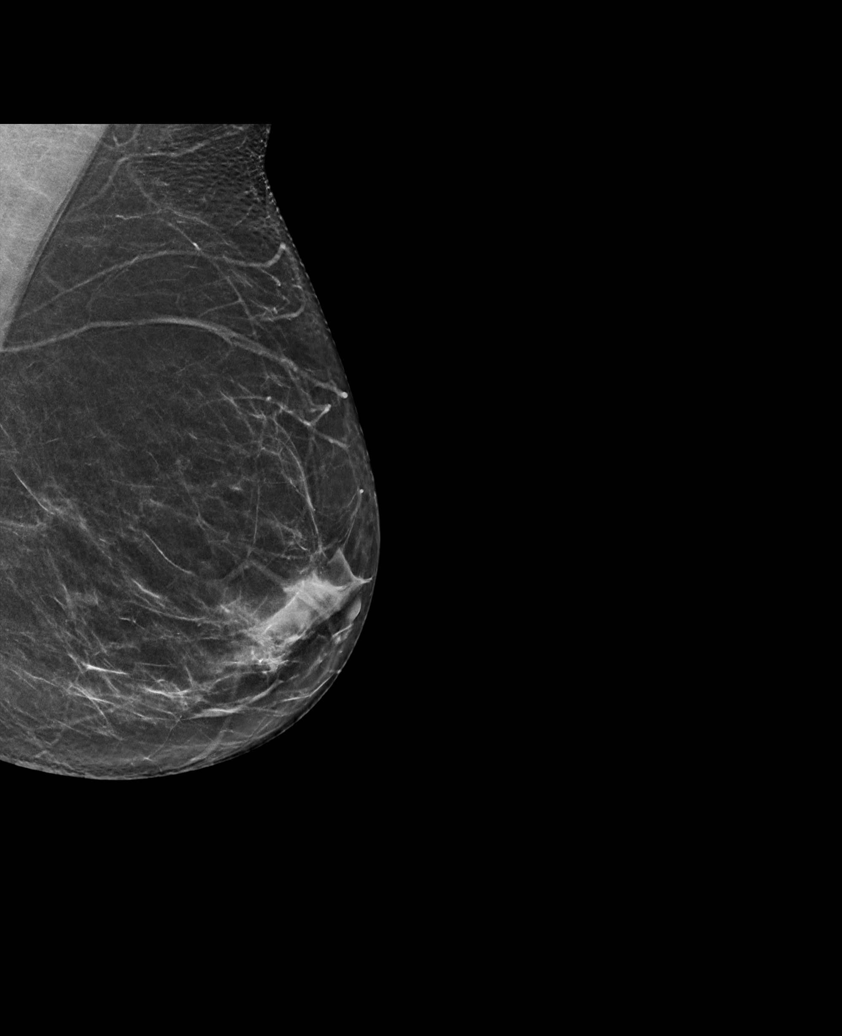

[L MLO synth-2D (2 of 2)]
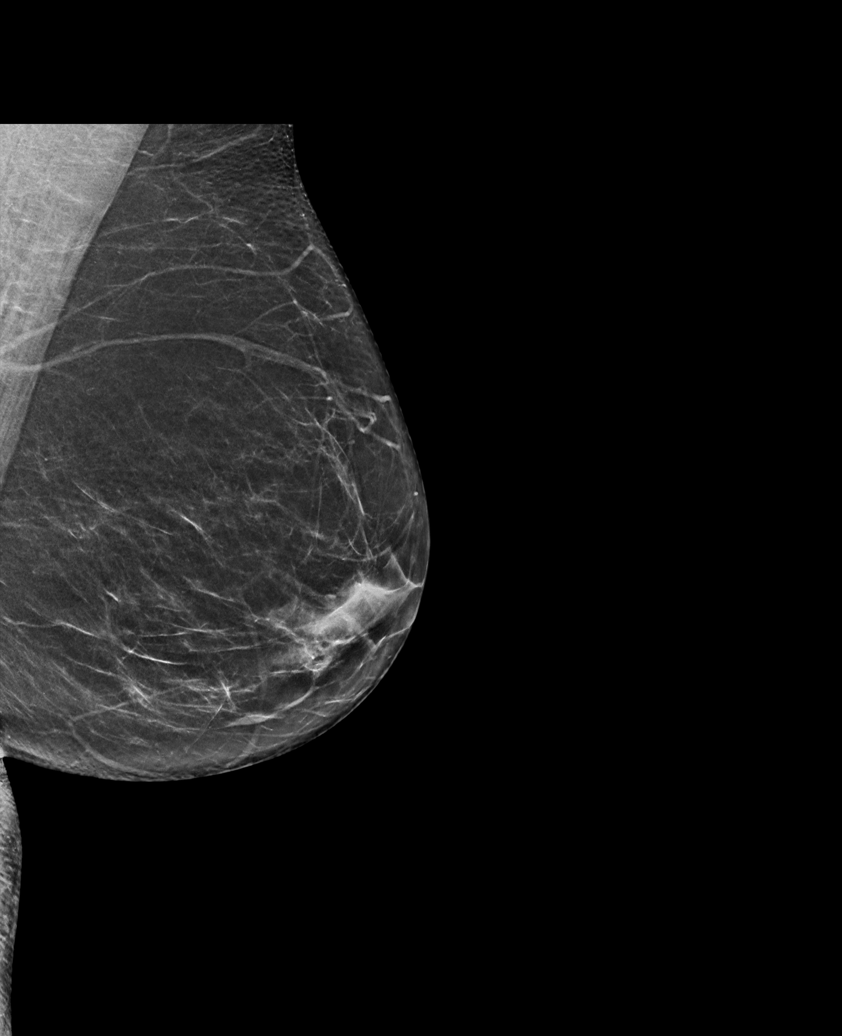

[R MLO synth-2D]
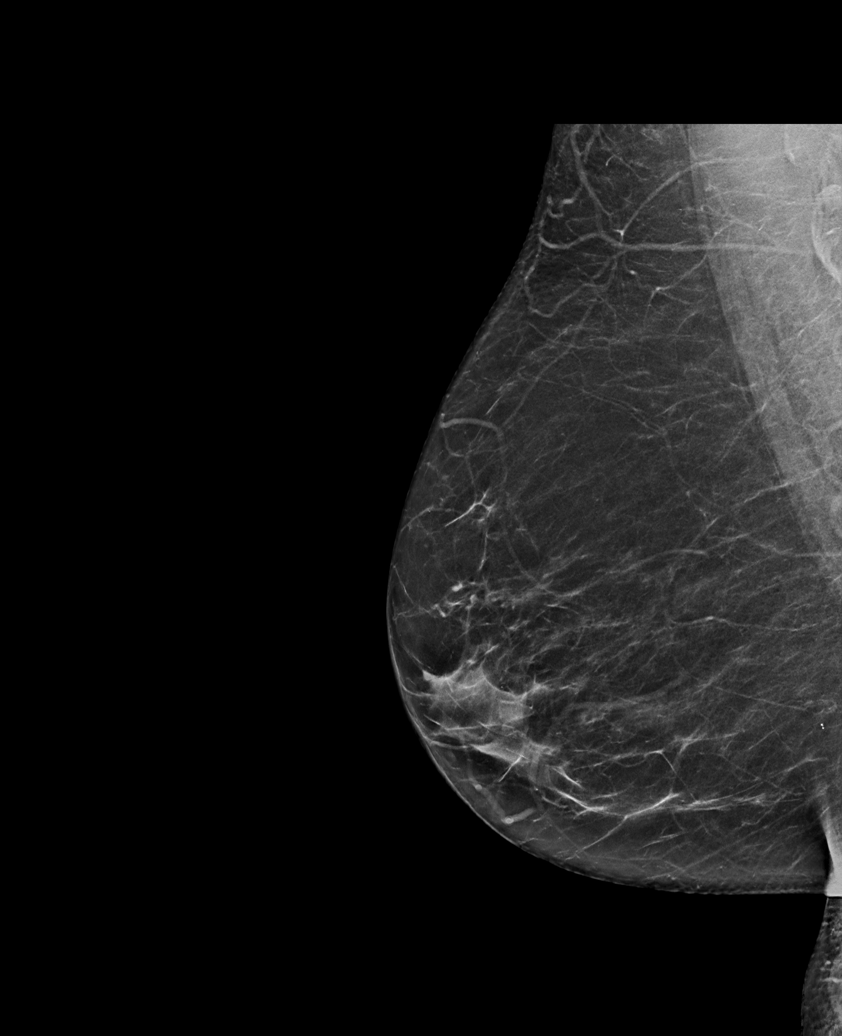

[R CC synth-2D]
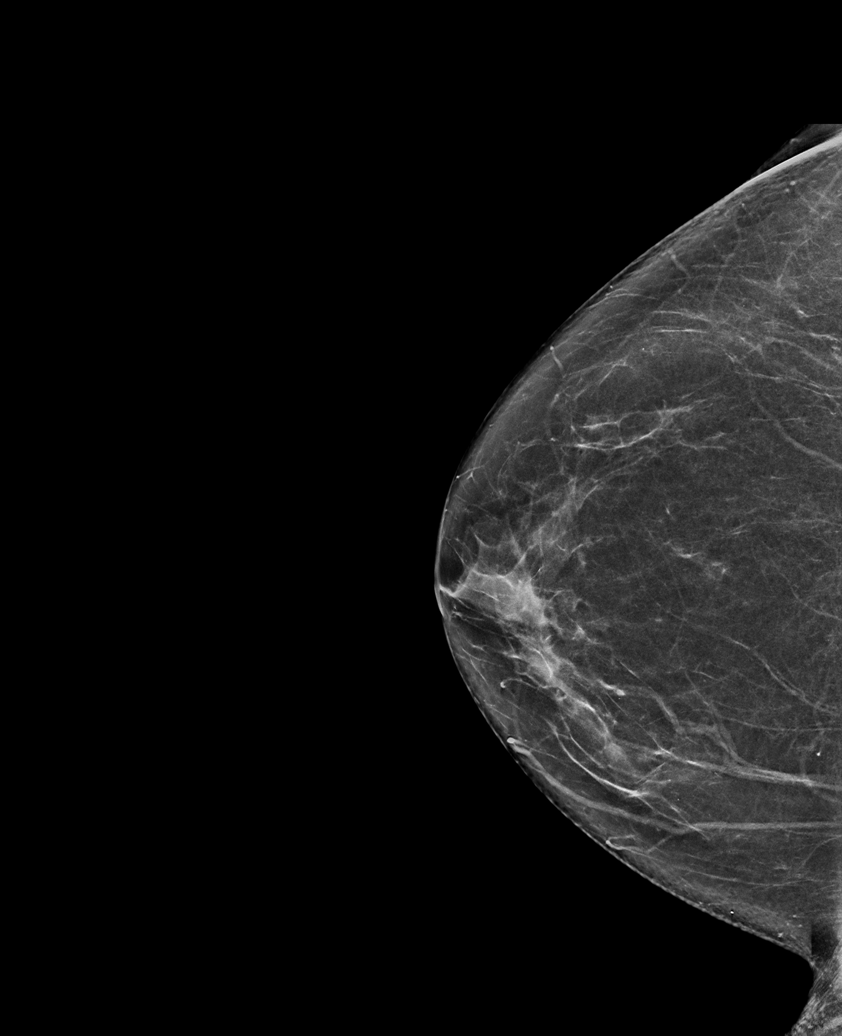

[L MLO tomo · tomo slice 34/67.0]
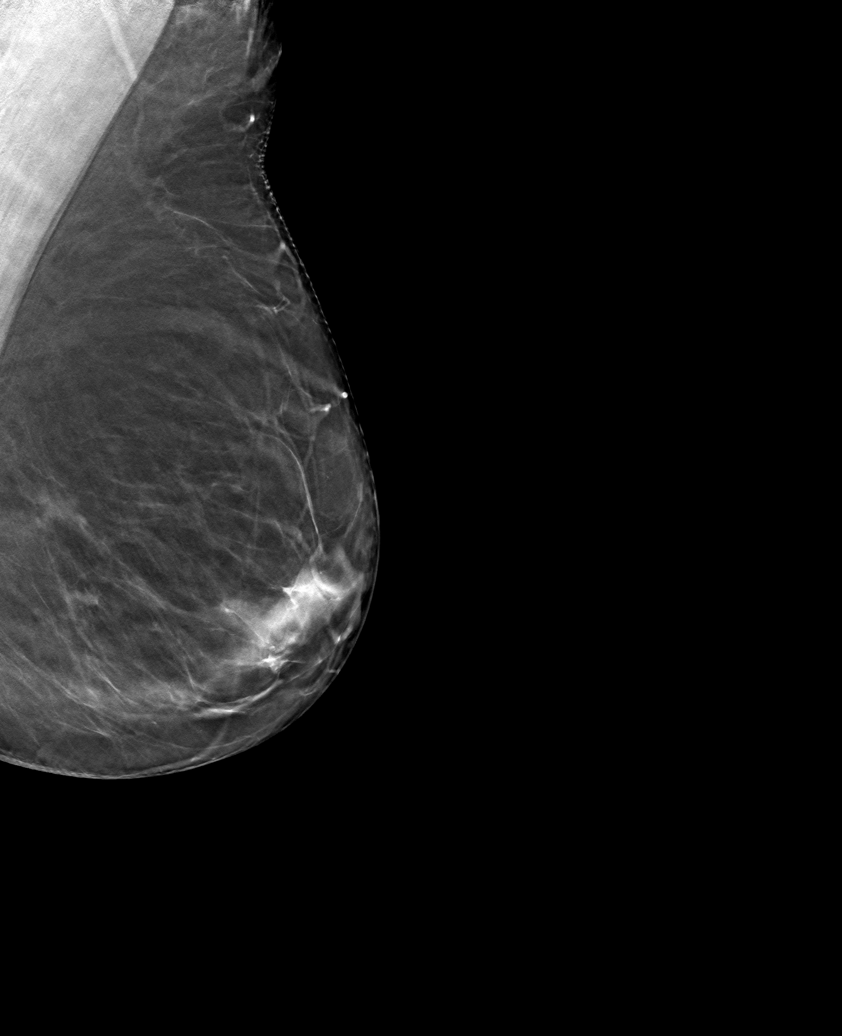

[6 of 30 positions shown; findings below may reference images not displayed]

ACR Breast Density Category b: There are scattered areas of
fibroglandular density.
FINDINGS: There are no findings suspicious for malignancy. Images were
processed with CAD.
IMPRESSION: No mammographic evidence of malignancy. A result letter of this
screening mammogram will be mailed directly to the patient.

RECOMMENDATION:
Screening mammogram in one year. (Code:[id number])

BI-RADS CATEGORY  1: Negative.

## 2021-09-29 DIAGNOSIS — M810 Age-related osteoporosis without current pathological fracture: Secondary | ICD-10-CM | POA: Insufficient documentation

## 2021-10-01 DIAGNOSIS — L570 Actinic keratosis: Secondary | ICD-10-CM | POA: Diagnosis not present

## 2021-10-01 DIAGNOSIS — X32XXXD Exposure to sunlight, subsequent encounter: Secondary | ICD-10-CM | POA: Diagnosis not present

## 2021-10-18 ENCOUNTER — Encounter: Payer: Self-pay | Admitting: Family Medicine

## 2021-11-16 ENCOUNTER — Ambulatory Visit (INDEPENDENT_AMBULATORY_CARE_PROVIDER_SITE_OTHER): Payer: PPO | Admitting: Family Medicine

## 2021-11-16 ENCOUNTER — Encounter: Payer: Self-pay | Admitting: Family Medicine

## 2021-11-16 ENCOUNTER — Ambulatory Visit: Payer: PPO | Attending: Internal Medicine

## 2021-11-16 VITALS — BP 130/80 | HR 78 | Temp 98.0°F | Resp 18 | Ht 62.0 in | Wt 178.6 lb

## 2021-11-16 DIAGNOSIS — E119 Type 2 diabetes mellitus without complications: Secondary | ICD-10-CM

## 2021-11-16 DIAGNOSIS — F419 Anxiety disorder, unspecified: Secondary | ICD-10-CM | POA: Diagnosis not present

## 2021-11-16 DIAGNOSIS — E785 Hyperlipidemia, unspecified: Secondary | ICD-10-CM | POA: Diagnosis not present

## 2021-11-16 DIAGNOSIS — I10 Essential (primary) hypertension: Secondary | ICD-10-CM

## 2021-11-16 DIAGNOSIS — Z23 Encounter for immunization: Secondary | ICD-10-CM

## 2021-11-16 LAB — COMPREHENSIVE METABOLIC PANEL
ALT: 43 U/L — ABNORMAL HIGH (ref 0–35)
AST: 28 U/L (ref 0–37)
Albumin: 4 g/dL (ref 3.5–5.2)
Alkaline Phosphatase: 83 U/L (ref 39–117)
BUN: 13 mg/dL (ref 6–23)
CO2: 30 mEq/L (ref 19–32)
Calcium: 9.5 mg/dL (ref 8.4–10.5)
Chloride: 106 mEq/L (ref 96–112)
Creatinine, Ser: 0.78 mg/dL (ref 0.40–1.20)
GFR: 78.91 mL/min (ref >60.00)
Glucose, Bld: 105 mg/dL — ABNORMAL HIGH (ref 70–99)
Potassium: 4.6 mEq/L (ref 3.5–5.1)
Sodium: 141 mEq/L (ref 135–145)
Total Bilirubin: 0.4 mg/dL (ref 0.2–1.2)
Total Protein: 6.4 g/dL (ref 6.0–8.3)

## 2021-11-16 LAB — HEMOGLOBIN A1C: Hgb A1c MFr Bld: 6.5 % (ref 4.6–6.5)

## 2021-11-16 LAB — LIPID PANEL
Cholesterol: 118 mg/dL (ref 0–200)
HDL: 39.8 mg/dL (ref >39.00)
LDL Cholesterol: 67 mg/dL (ref 0–99)
NonHDL: 77.72
Total CHOL/HDL Ratio: 3
Triglycerides: 55 mg/dL (ref 0.0–149.0)
VLDL: 11 mg/dL (ref 0.0–40.0)

## 2021-11-16 LAB — MICROALBUMIN / CREATININE URINE RATIO
Creatinine,U: 196.8 mg/dL
Microalb Creat Ratio: 0.6 mg/g (ref 0.0–30.0)
Microalb, Ur: 1.2 mg/dL (ref 0.0–1.9)

## 2021-11-16 MED ORDER — ESCITALOPRAM OXALATE 10 MG PO TABS: 10.0000 mg | ORAL_TABLET | Freq: Every morning | ORAL | 3 refills | Status: DC

## 2021-11-16 NOTE — Progress Notes (Signed)
° °  Covid-19 Vaccination Clinic  Name:  NORMA MONTEMURRO    MRN: [id number] DOB: April 28, 1956  11/16/2021  Ms. Biss was observed post Covid-19 immunization for 15 minutes without incident. She was provided with Vaccine Information Sheet and instruction to access the V-Safe system.   Ms. Scrima was instructed to call 911 with any severe reactions post vaccine: Difficulty breathing  Swelling of face and throat  A fast heartbeat  A bad rash all over body  Dizziness and weakness   Immunizations Administered     Name Date Dose VIS Date Route   Pfizer Covid-19 Vaccine Bivalent Booster 11/16/2021 11:38 AM 0.3 mL 06/23/2021 Intramuscular   Manufacturer: [place]   Lot: [id number]   [place]: [phone]

## 2021-11-16 NOTE — Assessment & Plan Note (Signed)
hgba1c to be checked, minimize simple carbs. Increase exercise as tolerated. Continue current meds  

## 2021-11-16 NOTE — Assessment & Plan Note (Signed)
Encourage heart healthy diet such as MIND or DASH diet, increase exercise, avoid trans fats, simple carbohydrates and processed foods, consider a krill or fish or flaxseed oil cap daily.  °

## 2021-11-16 NOTE — Patient Instructions (Signed)

## 2021-11-16 NOTE — Progress Notes (Signed)
Established Patient Office Visit  Subjective:  Patient ID: ADANNA ZUCKERMAN, female    DOB: 1956/05/06  Age: 66 y.o. MRN: [id number]  CC:  Chief Complaint  Patient presents with   Hypertension   Hyperlipidemia   Follow-up   Pain Management    HPI OAKLYNN STIERWALT presents for f/u bp and chol.   Past Medical History:  Diagnosis Date   Anxiety    GERD (gastroesophageal reflux disease)    Hyperlipidemia    Hypertension    Osteoporosis     Past Surgical History:  Procedure Laterality Date   COLONOSCOPY W/ POLYPECTOMY  04/2009   due 2015   COLONOSCOPY WITH PROPOFOL N/A 08/12/2014   Procedure: COLONOSCOPY WITH PROPOFOL;  Surgeon: Garlan Fair, MD;  Location: [facility];  Service: Endoscopy;  Laterality: N/A;   TUBAL LIGATION     Uterine polyps      Family History  Problem Relation Age of Onset   Cancer Mother        rectal   Diabetes Mother    Hypertension Mother    Stroke Mother        in 97s   Rectal cancer Mother    Colon polyps Father    Heart attack Father        62 & 70   Hyperlipidemia Sister    Hypertension Sister    Breast cancer Maternal Aunt    Colon cancer Neg Hx    Esophageal cancer Neg Hx    Ulcerative colitis Neg Hx     Social History   Socioeconomic History   Marital status: Married    Spouse name: Not on file   Number of children: Not on file   Years of education: Not on file   Highest education level: Not on file  Occupational History   Occupation: retired Engineer, water  Tobacco Use   Smoking status: Never   Smokeless tobacco: Never  Vaping Use   Vaping Use: Never used  Substance and Sexual Activity   Alcohol use: No   Drug use: No   Sexual activity: Yes    Partners: Male  Other Topics Concern   Not on file  Social History Narrative   Not on file   Social Determinants of Health   Financial Resource Strain: Not on file  Food Insecurity: Not on file  Transportation Needs: Not on file  Physical Activity: Not on file  Stress:  Not on file  Social Connections: Not on file  Intimate Partner Violence: Not on file    Outpatient Medications Prior to Visit  Medication Sig Dispense Refill   Acetylcarnitine HCl (ACETYL L-CARNITINE PO) Take 1 tablet by mouth daily.     Alpha-Lipoic Acid 300 MG CAPS Take by mouth.     b complex vitamins tablet Take 1 tablet by mouth daily. B-50     CINNAMON PO Take 6,000 mg by mouth. [place] 6000mg  once daily     docusate sodium (COLACE) 100 MG capsule Take 100 mg by mouth daily.     Lysine 500 MG TABS Take by mouth.     Nutritional Supplements (DHEA PO) Take by mouth daily.     OVER THE COUNTER MEDICATION Iron, one tablet daily.     OVER THE COUNTER MEDICATION Tumeric, one capsule daily.     OVER THE COUNTER MEDICATION Apple Cider Vinegar, one capsule daily.     OVER THE COUNTER MEDICATION Eye vitamin, doesn't know the name.     OVER THE COUNTER  MEDICATION Chondrontin- glucosime,  Two tablets daily.     OVER THE COUNTER MEDICATION Magnesium, two tablets daily.     OVER THE COUNTER MEDICATION Calcium, 4 tablets daily.     OVER THE COUNTER MEDICATION Vitamin E, one capsule daily.     OVER THE COUNTER MEDICATION Beta carotene, one capsule daily.     OVER THE COUNTER MEDICATION Garlic, one tablet daily.     OVER THE COUNTER MEDICATION MSM- one tablet daily.     OVER THE COUNTER MEDICATION 100 mg. DIM 100mg  once daily     OVER THE COUNTER MEDICATION Take 200 mg by mouth daily. Chromium picolinate 200mg  once dayl     OVER THE COUNTER MEDICATION Sulforaphone 400mg  once daily     pantoprazole (PROTONIX) 40 MG tablet TAKE 1 TABLET(40 MG) BY MOUTH DAILY 90 tablet 1   simvastatin (ZOCOR) 20 MG tablet TAKE 1 TABLET(20 MG) BY MOUTH AT BEDTIME 90 tablet 1   escitalopram (LEXAPRO) 10 MG tablet Take 1 tablet (10 mg total) by mouth in the morning. 90 tablet 1   methylPREDNISolone (MEDROL DOSEPAK) 4 MG TBPK tablet Please take per packaging instructions. 21 tablet 0   pregabalin (LYRICA) 25 MG capsule  Take 1 capsule (25 mg total) by mouth 2 (two) times daily for 15 days. 30 capsule 0   valACYclovir (VALTREX) 1000 MG tablet Take 1 tablet (1,000 mg total) by mouth 3 (three) times daily. For 7 days for shingles. 21 tablet 0   No facility-administered medications prior to visit.    Allergies  Allergen Reactions   Penicillins Itching and Rash   Bacitracin Hives    ROS Review of Systems  Constitutional:  Negative for fever.  HENT:  Negative for congestion.   Respiratory:  Negative for cough.   Cardiovascular:  Negative for chest pain and palpitations.  Gastrointestinal:  Negative for vomiting.  Musculoskeletal:  Negative for back pain.  Skin:  Negative for rash.  Neurological:  Negative for headaches.     Objective:    Physical Exam Vitals and nursing note reviewed.  Constitutional:      Appearance: She is well-developed.  HENT:     Head: Normocephalic and atraumatic.  Eyes:     Conjunctiva/sclera: Conjunctivae normal.  Neck:     Thyroid: No thyromegaly.     Vascular: No carotid bruit or JVD.  Cardiovascular:     Rate and Rhythm: Normal rate and regular rhythm.     Heart sounds: Normal heart sounds. No murmur heard. Pulmonary:     Effort: Pulmonary effort is normal. No respiratory distress.     Breath sounds: Normal breath sounds. No wheezing or rales.  Chest:     Chest wall: No tenderness.  Musculoskeletal:     Cervical back: Normal range of motion and neck supple.  Neurological:     Mental Status: She is alert and oriented to person, place, and time.    BP 130/80 (BP Location: Left Arm, Patient Position: Sitting, Cuff Size: Normal)    Pulse 78    Temp 98 F (36.7 C) (Oral)    Resp 18    Ht 5\' 2"  (1.575 m)    Wt 178 lb 9.6 oz (81 kg)    SpO2 99%    BMI 32.67 kg/m  Wt Readings from Last 3 Encounters:  11/16/21 178 lb 9.6 oz (81 kg)  08/13/21 177 lb 3.2 oz (80.4 kg)  08/05/21 174 lb 3.2 oz (79 kg)     Health Maintenance Due  Topic Date Due   COVID-19 Vaccine  (4 - Booster for Pfizer series) 10/24/2020   HEMOGLOBIN A1C  08/14/2021   FOOT EXAM  11/02/2021    There are no preventive care reminders to display for this patient.  Lab Results  Component Value Date   TSH 2.05 03/25/2015   Lab Results  Component Value Date   WBC 9.0 06/07/2021   HGB 13.5 06/07/2021   HCT 41.6 06/07/2021   MCV 85.8 06/07/2021   PLT 266.0 06/07/2021   Lab Results  Component Value Date   NA 141 06/07/2021   K 4.4 06/07/2021   CO2 29 06/07/2021   GLUCOSE 85 06/07/2021   BUN 15 06/07/2021   CREATININE 0.75 06/07/2021   BILITOT 0.4 06/07/2021   ALKPHOS 101 06/07/2021   AST 19 06/07/2021   ALT 27 06/07/2021   PROT 6.5 06/07/2021   ALBUMIN 4.0 06/07/2021   CALCIUM 9.3 06/07/2021   ANIONGAP 5 10/05/2020   GFR 82.97 06/07/2021   Lab Results  Component Value Date   CHOL 117 04/29/2021   Lab Results  Component Value Date   HDL 37.80 (L) 04/29/2021   Lab Results  Component Value Date   LDLCALC 63 04/29/2021   Lab Results  Component Value Date   TRIG 80.0 04/29/2021   Lab Results  Component Value Date   CHOLHDL 3 04/29/2021   Lab Results  Component Value Date   HGBA1C 6.4 02/12/2021      Assessment & Plan:   Problem List Items Addressed This Visit       Unprioritized   Anxiety    con't lexapro  Stable       Relevant Medications   escitalopram (LEXAPRO) 10 MG tablet   Diet-controlled diabetes mellitus ([place])    hgba1c to be checked , minimize simple carbs. Increase exercise as tolerated. Continue current meds       Relevant Orders   Hemoglobin A1c   Microalbumin / creatinine urine ratio   Essential hypertension    Well controlled, no changes to meds. Encouraged heart healthy diet such as the DASH diet and exercise as tolerated.       Hyperlipidemia - Primary    Encourage heart healthy diet such as MIND or DASH diet, increase exercise, avoid trans fats, simple carbohydrates and processed foods, consider a krill or fish or  flaxseed oil cap daily.       Relevant Orders   Lipid panel   Comprehensive metabolic panel   Other Visit Diagnoses     Primary hypertension       Relevant Orders   Lipid panel   Comprehensive metabolic panel   Need for pneumococcal vaccination       Relevant Orders   Pneumococcal conjugate vaccine 20-valent (Prevnar 20) (Completed)       Meds ordered this encounter  Medications   escitalopram (LEXAPRO) 10 MG tablet    Sig: Take 1 tablet (10 mg total) by mouth in the morning.    Dispense:  90 tablet    Refill:  3    Follow-up: Return in about 6 months (around 05/16/2022), or if symptoms worsen or fail to improve, for annual exam, fasting.    Ann Held, DO

## 2021-11-16 NOTE — Assessment & Plan Note (Signed)
Well controlled, no changes to meds. Encouraged heart healthy diet such as the DASH diet and exercise as tolerated.  °

## 2021-11-16 NOTE — Assessment & Plan Note (Signed)
con't lexapro  Stable

## 2021-11-18 ENCOUNTER — Other Ambulatory Visit (HOSPITAL_BASED_OUTPATIENT_CLINIC_OR_DEPARTMENT_OTHER): Payer: Self-pay

## 2021-11-18 MED ORDER — PFIZER COVID-19 VAC BIVALENT 30 MCG/0.3ML IM SUSP
INTRAMUSCULAR | 0 refills | Status: DC
  Filled 2021-11-18: qty 0.3, 1d supply, fill #0

## 2022-01-13 ENCOUNTER — Other Ambulatory Visit: Payer: Self-pay | Admitting: Family Medicine

## 2022-01-13 DIAGNOSIS — E785 Hyperlipidemia, unspecified: Secondary | ICD-10-CM

## 2022-02-16 ENCOUNTER — Encounter: Payer: Self-pay | Admitting: Family Medicine

## 2022-02-16 ENCOUNTER — Other Ambulatory Visit: Payer: Self-pay

## 2022-02-16 DIAGNOSIS — R1013 Epigastric pain: Secondary | ICD-10-CM

## 2022-02-17 DIAGNOSIS — E119 Type 2 diabetes mellitus without complications: Secondary | ICD-10-CM | POA: Diagnosis not present

## 2022-02-17 DIAGNOSIS — H2513 Age-related nuclear cataract, bilateral: Secondary | ICD-10-CM | POA: Diagnosis not present

## 2022-02-17 DIAGNOSIS — H5213 Myopia, bilateral: Secondary | ICD-10-CM | POA: Diagnosis not present

## 2022-02-17 DIAGNOSIS — H04121 Dry eye syndrome of right lacrimal gland: Secondary | ICD-10-CM | POA: Diagnosis not present

## 2022-02-17 LAB — HM DIABETES EYE EXAM

## 2022-03-08 ENCOUNTER — Ambulatory Visit (INDEPENDENT_AMBULATORY_CARE_PROVIDER_SITE_OTHER): Payer: PPO

## 2022-03-08 DIAGNOSIS — Z0000 Encounter for general adult medical examination without abnormal findings: Secondary | ICD-10-CM | POA: Diagnosis not present

## 2022-03-08 NOTE — Patient Instructions (Signed)
[name] , ?Thank you for taking time to come for your Medicare Wellness Visit. I appreciate your ongoing commitment to your health goals. Please review the following plan we discussed and let me know if I can assist you in the future.  ? ?Screening recommendations/referrals: ?Colonoscopy: Done 08/15/19 repeat every 5 years  ?Mammogram: Done 04/22/21 repeat every year  ?Bone Density: Done 10/14/13 repeat as directed  ?Recommended yearly ophthalmology/optometry visit for glaucoma screening and checkup ?Recommended yearly dental visit for hygiene and checkup ? ?Vaccinations: ?Influenza vaccine: Done 09/08/21 repeat every year  ?Pneumococcal vaccine: Up to date ?Tdap vaccine: Done 12/23/16 repeat every 10 years  ?Shingles vaccine: Completed 2/12, 02/12/18   ?Covid-19:Completed 3/25, 4/20, 08/29/20 & 11/16/21 ? ?Advanced directives: Advance directive discussed with you today. Even though you declined this today please call our office should you change your mind and we can give you the proper paperwork for you to fill out. ? ?Conditions/risks identified: Lose weight  ? ?Next appointment: Follow up in one year for your annual wellness visit  ? ? ?Preventive Care 75 Years and Older, Female ?Preventive care refers to lifestyle choices and visits with your health care provider that can promote health and wellness. ?What does preventive care include? ?A yearly physical exam. This is also called an annual well check. ?Dental exams once or twice a year. ?Routine eye exams. Ask your health care provider how often you should have your eyes checked. ?Personal lifestyle choices, including: ?Daily care of your teeth and gums. ?Regular physical activity. ?Eating a healthy diet. ?Avoiding tobacco and drug use. ?Limiting alcohol use. ?Practicing safe sex. ?Taking low-dose aspirin every day. ?Taking vitamin and mineral supplements as recommended by your health care provider. ?What happens during an annual well check? ?The services and  screenings done by your health care provider during your annual well check will depend on your age, overall health, lifestyle risk factors, and family history of disease. ?Counseling  ?Your health care provider may ask you questions about your: ?Alcohol use. ?Tobacco use. ?Drug use. ?Emotional well-being. ?Home and relationship well-being. ?Sexual activity. ?Eating habits. ?History of falls. ?Memory and ability to understand (cognition). ?Work and work Statistician. ?Reproductive health. ?Screening  ?You may have the following tests or measurements: ?Height, weight, and BMI. ?Blood pressure. ?Lipid and cholesterol levels. These may be checked every 5 years, or more frequently if you are over 12 years old. ?Skin check. ?Lung cancer screening. You may have this screening every year starting at age 38 if you have a 30-pack-year history of smoking and currently smoke or have quit within the past 15 years. ?Fecal occult blood test (FOBT) of the stool. You may have this test every year starting at age 47. ?Flexible sigmoidoscopy or colonoscopy. You may have a sigmoidoscopy every 5 years or a colonoscopy every 10 years starting at age 17. ?Hepatitis C blood test. ?Hepatitis B blood test. ?Sexually transmitted disease (STD) testing. ?Diabetes screening. This is done by checking your blood sugar (glucose) after you have not eaten for a while (fasting). You may have this done every 1-3 years. ?Bone density scan. This is done to screen for osteoporosis. You may have this done starting at age 8. ?Mammogram. This may be done every 1-2 years. Talk to your health care provider about how often you should have regular mammograms. ?Talk with your health care provider about your test results, treatment options, and if necessary, the need for more tests. ?Vaccines  ?Your health care provider may recommend certain  vaccines, such as: ?Influenza vaccine. This is recommended every year. ?Tetanus, diphtheria, and acellular pertussis (Tdap,  Td) vaccine. You may need a Td booster every 10 years. ?Zoster vaccine. You may need this after age 74. ?Pneumococcal 13-valent conjugate (PCV13) vaccine. One dose is recommended after age 71. ?Pneumococcal polysaccharide (PPSV23) vaccine. One dose is recommended after age 46. ?Talk to your health care provider about which screenings and vaccines you need and how often you need them. ?This information is not intended to replace advice given to you by your health care provider. Make sure you discuss any questions you have with your health care provider. ?Document Released: 11/06/2015 Document Revised: 06/29/2016 Document Reviewed: 08/11/2015 ?Elsevier Interactive Patient Education ? 2017 [place]. ? ?Fall Prevention in the Home ?Falls can cause injuries. They can happen to people of all ages. There are many things you can do to make your home safe and to help prevent falls. ?What can I do on the outside of my home? ?Regularly fix the edges of walkways and driveways and fix any cracks. ?Remove anything that might make you trip as you walk through a door, such as a raised step or threshold. ?Trim any bushes or trees on the path to your home. ?Use bright outdoor lighting. ?Clear any walking paths of anything that might make someone trip, such as rocks or tools. ?Regularly check to see if handrails are loose or broken. Make sure that both sides of any steps have handrails. ?Any raised decks and porches should have guardrails on the edges. ?Have any leaves, snow, or ice cleared regularly. ?Use sand or salt on walking paths during winter. ?Clean up any spills in your garage right away. This includes oil or grease spills. ?What can I do in the bathroom? ?Use night lights. ?Install grab bars by the toilet and in the tub and shower. Do not use towel bars as grab bars. ?Use non-skid mats or decals in the tub or shower. ?If you need to sit down in the shower, use a plastic, non-slip stool. ?Keep the floor dry. Clean up any  water that spills on the floor as soon as it happens. ?Remove soap buildup in the tub or shower regularly. ?Attach bath mats securely with double-sided non-slip rug tape. ?Do not have throw rugs and other things on the floor that can make you trip. ?What can I do in the bedroom? ?Use night lights. ?Make sure that you have a light by your bed that is easy to reach. ?Do not use any sheets or blankets that are too big for your bed. They should not hang down onto the floor. ?Have a firm chair that has side arms. You can use this for support while you get dressed. ?Do not have throw rugs and other things on the floor that can make you trip. ?What can I do in the kitchen? ?Clean up any spills right away. ?Avoid walking on wet floors. ?Keep items that you use a lot in easy-to-reach places. ?If you need to reach something above you, use a strong step stool that has a grab bar. ?Keep electrical cords out of the way. ?Do not use floor polish or wax that makes floors slippery. If you must use wax, use non-skid floor wax. ?Do not have throw rugs and other things on the floor that can make you trip. ?What can I do with my stairs? ?Do not leave any items on the stairs. ?Make sure that there are handrails on both sides of the stairs  and use them. Fix handrails that are broken or loose. Make sure that handrails are as long as the stairways. ?Check any carpeting to make sure that it is firmly attached to the stairs. Fix any carpet that is loose or worn. ?Avoid having throw rugs at the top or bottom of the stairs. If you do have throw rugs, attach them to the floor with carpet tape. ?Make sure that you have a light switch at the top of the stairs and the bottom of the stairs. If you do not have them, ask someone to add them for you. ?What else can I do to help prevent falls? ?Wear shoes that: ?Do not have high heels. ?Have rubber bottoms. ?Are comfortable and fit you well. ?Are closed at the toe. Do not wear sandals. ?If you use a  stepladder: ?Make sure that it is fully opened. Do not climb a closed stepladder. ?Make sure that both sides of the stepladder are locked into place. ?Ask someone to hold it for you, if possible. ?Clearly ma

## 2022-03-08 NOTE — Progress Notes (Addendum)
Virtual Visit via Telephone Note  I connected with  [name] on 03/08/22 at  1:00 PM EDT by telephone and verified that I am speaking with the correct person using two identifiers.  Medicare Annual Wellness visit completed telephonically due to Covid-19 pandemic.   Persons participating in this call: This Health Coach and this patient.   Location: Patient: home Provider: Office   I discussed the limitations, risks, security and privacy concerns of performing an evaluation and management service by telephone and the availability of in person appointments. The patient expressed understanding and agreed to proceed.  Unable to perform video visit due to video visit attempted and failed and/or patient does not have video capability.   Some vital signs may be absent or patient reported.   Willette Brace, LPN   Subjective:   GENIENE LIST is a 66 y.o. female who presents for Medicare Annual (Subsequent) preventive examination.  Review of Systems     Cardiac Risk Factors include: advanced age (>48mn, >>62women);diabetes mellitus;hypertension;dyslipidemia;obesity (BMI >30kg/m2)     Objective:    There were no vitals filed for this visit. There is no height or weight on file to calculate BMI.     03/08/2022    1:03 PM 11/02/2020   10:17 AM 10/05/2020    1:08 PM 08/12/2014   11:09 AM 07/28/2014    2:26 PM  Advanced Directives  Does Patient Have a Medical Advance Directive? No No No No No  Would patient like information on creating a medical advance directive? No - Patient declined Yes (MAU/Ambulatory/Procedural Areas - Information given) No - Patient declined No - patient declined information No - patient declined information    Current Medications (verified) Outpatient Encounter Medications as of 03/08/2022  Medication Sig   Acetylcarnitine HCl (ACETYL L-CARNITINE PO) Take 1 tablet by mouth daily.   Alpha-Lipoic Acid 300 MG CAPS Take by mouth.   b complex vitamins  tablet Take 1 tablet by mouth daily. B-50   CINNAMON PO Take 6,000 mg by mouth. [place] '6000mg'$  once daily   docusate sodium (COLACE) 100 MG capsule Take 100 mg by mouth daily.   escitalopram (LEXAPRO) 10 MG tablet Take 1 tablet (10 mg total) by mouth in the morning.   Lysine 500 MG TABS Take by mouth.   Nutritional Supplements (DHEA PO) Take by mouth daily.   OVER THE COUNTER MEDICATION Iron, one tablet daily.   OVER THE COUNTER MEDICATION Tumeric, one capsule daily.   OVER THE COUNTER MEDICATION Apple Cider Vinegar, one capsule daily.   OVER THE COUNTER MEDICATION Eye vitamin, doesn't know the name.   OVER THE COUNTER MEDICATION Chondrontin- glucosime,  Two tablets daily.   OVER THE COUNTER MEDICATION Magnesium, two tablets daily.   OVER THE COUNTER MEDICATION Calcium, 4 tablets daily.   OVER THE COUNTER MEDICATION Vitamin E, one capsule daily.   OVER THE COUNTER MEDICATION Beta carotene, one capsule daily.   OVER THE COUNTER MEDICATION Garlic, one tablet daily.   OVER THE COUNTER MEDICATION MSM- one tablet daily.   OVER THE COUNTER MEDICATION 100 mg. DIM '100mg'$  once daily   OVER THE COUNTER MEDICATION Take 200 mg by mouth daily. Chromium picolinate '200mg'$  once dayl   OVER THE COUNTER MEDICATION Sulforaphone '400mg'$  once daily   pantoprazole (PROTONIX) 40 MG tablet TAKE 1 TABLET(40 MG) BY MOUTH DAILY   simvastatin (ZOCOR) 20 MG tablet TAKE 1 TABLET(20 MG) BY MOUTH AT BEDTIME   COVID-19 mRNA bivalent vaccine, [place] (PFIZER COVID-19  VAC BIVALENT) injection Inject into the muscle.   No facility-administered encounter medications on file as of 03/08/2022.    Allergies (verified) Penicillins and Bacitracin   History: Past Medical History:  Diagnosis Date   Anxiety    GERD (gastroesophageal reflux disease)    Hyperlipidemia    Hypertension    Osteoporosis    Past Surgical History:  Procedure Laterality Date   COLONOSCOPY W/ POLYPECTOMY  04/2009   due 2015   COLONOSCOPY WITH PROPOFOL  N/A 08/12/2014   Procedure: COLONOSCOPY WITH PROPOFOL;  Surgeon: Garlan Fair, MD;  Location: [facility];  Service: Endoscopy;  Laterality: N/A;   TUBAL LIGATION     Uterine polyps     Family History  Problem Relation Age of Onset   Cancer Mother        rectal   Diabetes Mother    Hypertension Mother    Stroke Mother        in 57s   Rectal cancer Mother    Colon polyps Father    Heart attack Father        63 & 17   Hyperlipidemia Sister    Hypertension Sister    Breast cancer Maternal Aunt    Colon cancer Neg Hx    Esophageal cancer Neg Hx    Ulcerative colitis Neg Hx    Social History   Socioeconomic History   Marital status: Married    Spouse name: Not on file   Number of children: Not on file   Years of education: Not on file   Highest education level: Not on file  Occupational History   Occupation: retired Engineer, water  Tobacco Use   Smoking status: Never   Smokeless tobacco: Never  Vaping Use   Vaping Use: Never used  Substance and Sexual Activity   Alcohol use: No   Drug use: No   Sexual activity: Yes    Partners: Male  Other Topics Concern   Not on file  Social History Narrative   Not on file   Social Determinants of Health   Financial Resource Strain: Low Risk    Difficulty of Paying Living Expenses: Not hard at all  Food Insecurity: No Food Insecurity   Worried About Charity fundraiser in the Last Year: Never true   [place] in the Last Year: Never true  Transportation Needs: No Transportation Needs   Lack of Transportation (Medical): No   Lack of Transportation (Non-Medical): No  Physical Activity: Sufficiently Active   Days of Exercise per Week: 3 days   Minutes of Exercise per Session: 90 min  Stress: No Stress Concern Present   Feeling of Stress : Not at all  Social Connections: Moderately Integrated   Frequency of Communication with Friends and Family: Twice a week   Frequency of Social Gatherings with Friends and Family: More than  three times a week   Attends Religious Services: More than 4 times per year   Active Member of [employer] or Organizations: No   Attends Music therapist: Never   Marital Status: Married    Tobacco Counseling Counseling given: Not Answered   Clinical Intake:  Pre-visit preparation completed: Yes  Pain : No/denies pain     BMI - recorded: 32.67 Nutritional Status: BMI > 30  Obese Nutritional Risks: None Diabetes: Yes CBG done?: No Did pt. bring in CBG monitor from home?: No  How often do you need to have someone help you when you read instructions,  pamphlets, or other written materials from your doctor or pharmacy?: 1 - Never  Diabetic?Nutrition Risk Assessment:  Has the patient had any N/V/D within the last 2 months?  No  Does the patient have any non-healing wounds?  No  Has the patient had any unintentional weight loss or weight gain?  No   Diabetes:  Is the patient diabetic?  Yes  If diabetic, was a CBG obtained today?  No  Did the patient bring in their glucometer from home?  No  How often do you monitor your CBG's? N/A.   Financial Strains and Diabetes Management:  Are you having any financial strains with the device, your supplies or your medication? No .  Does the patient want to be seen by Chronic Care Management for management of their diabetes?  No  Would the patient like to be referred to a Nutritionist or for Diabetic Management?  No   Diabetic Exams:  Diabetic Eye Exam: Completed 02/17/22 Diabetic Foot Exam: Overdue, Pt has been advised about the importance in completing this exam. Pt is scheduled for diabetic foot exam on next appt.   Interpreter Needed?: No  Information entered by :: Charlott Rakes, LPN   Activities of Daily Living    03/08/2022    1:04 PM  In your present state of health, do you have any difficulty performing the following activities:  Hearing? 0  Vision? 0  Difficulty concentrating or making decisions? 0   Walking or climbing stairs? 0  Dressing or bathing? 0  Doing errands, shopping? 0  Preparing Food and eating ? N  Using the Toilet? N  In the past six months, have you accidently leaked urine? Y  Comment with sneezing or coughing  Do you have problems with loss of bowel control? N  Managing your Medications? N  Managing your Finances? N  Housekeeping or managing your Housekeeping? N    Patient Care Team: Carollee Herter, Alferd Apa, DO as PCP - General (Family Medicine) Everlene Farrier, MD as Consulting Physician (Obstetrics and Gynecology) Katy Apo, MD as Consulting Physician (Ophthalmology)  Indicate any recent Medical Services you may have received from other than [facility] providers in the past year (date may be approximate).     Assessment:   This is a routine wellness examination for Adhira.  Hearing/Vision screen Hearing Screening - Comments:: Pt denies any hearing issues Vision Screening - Comments:: Pt follows up with Dr Prudencio Burly for annual eye exams   Dietary issues and exercise activities discussed: Current Exercise Habits: Home exercise routine, Type of exercise: walking, Time (Minutes): 45, Frequency (Times/Week): 3, Weekly Exercise (Minutes/Week): 135   Goals Addressed             This Visit's Progress    Patient Stated       Lose weight        Depression Screen    03/08/2022    1:02 PM 04/29/2021   11:09 AM 11/02/2020   10:11 AM 04/16/2020    8:56 AM 01/23/2018   10:13 AM 12/23/2016    2:25 PM  PHQ 2/9 Scores  PHQ - 2 Score 0 0 0 0 0 0  PHQ- 9 Score   0       Fall Risk    03/08/2022    1:04 PM 04/29/2021   11:09 AM 11/02/2020   10:10 AM  [place] in the past year? 0 0 0  Number falls in past yr: 0 0 0  Injury with Fall?  0 0 0  Risk for fall due to : Impaired vision    Follow up Falls prevention discussed Falls evaluation completed Falls evaluation completed    FALL RISK PREVENTION PERTAINING TO THE HOME:  Any stairs in or around the home?  Yes  If so, are there any without handrails? No  Home free of loose throw rugs in walkways, pet beds, electrical cords, etc? Yes  Adequate lighting in your home to reduce risk of falls? Yes   ASSISTIVE DEVICES UTILIZED TO PREVENT FALLS:  Life alert? No  Use of a cane, walker or w/c? No  Grab bars in the bathroom? Yes  Shower chair or bench in shower? Yes  Elevated toilet seat or a handicapped toilet? No   TIMED UP AND GO:  Was the test performed? No .   Cognitive Function:    11/02/2020   10:13 AM  MMSE - Mini Mental State Exam  Orientation to time 5  Orientation to Place 5  Registration 3  Attention/ Calculation 5  Recall 3  Language- name 2 objects 2  Language- repeat 1  Language- follow 3 step command 3  Language- read & follow direction 1  Write a sentence 1  Copy design 1  Total score 30        03/08/2022    1:06 PM  6CIT Screen  What Year? 0 points  What month? 0 points  What time? 0 points  Count back from 20 0 points  Months in reverse 0 points  Repeat phrase 0 points  Total Score 0 points    Immunizations Immunization History  Administered Date(s) Administered   Influenza,inj,Quad PF,6+ Mos 08/29/2018, 07/02/2019   Influenza-Unspecified 08/29/2020, 09/08/2021   PFIZER(Purple Top)SARS-COV-2 Vaccination 01/16/2020, 02/11/2020, 08/29/2020   PNEUMOCOCCAL CONJUGATE-20 11/16/2021   Pfizer Covid-19 Vaccine Bivalent Booster 49yr & up 11/16/2021   Pneumococcal Conjugate-13 03/30/2015   Pneumococcal Polysaccharide-23 12/23/2016, 11/02/2020   Tdap 12/23/2016   Zoster Recombinat (Shingrix) 12/05/2017, 02/12/2018   Zoster, Live 03/30/2015    TDAP status: Up to date  Flu Vaccine status: Up to date  Pneumococcal vaccine status: Up to date  Covid-19 vaccine status: Completed vaccines  Qualifies for Shingles Vaccine? Yes   Zostavax completed Yes   Shingrix Completed?: Yes  Screening Tests Health Maintenance  Topic Date Due   FOOT EXAM  11/02/2021    MAMMOGRAM  04/22/2022   HEMOGLOBIN A1C  05/16/2022   INFLUENZA VACCINE  05/24/2022   URINE MICROALBUMIN  11/16/2022   OPHTHALMOLOGY EXAM  02/18/2023   COLONOSCOPY (Pts 45-477yrInsurance coverage will need to be confirmed)  08/14/2024   TETANUS/TDAP  12/24/2026   Pneumonia Vaccine 6570Years old  Completed   DEXA SCAN  Completed   COVID-19 Vaccine  Completed   Hepatitis C Screening  Completed   Zoster Vaccines- Shingrix  Completed   HPV VACCINES  Aged Out    Health Maintenance  Health Maintenance Due  Topic Date Due   FOOT EXAM  11/02/2021    Colorectal cancer screening: Type of screening: Colonoscopy. Completed 08/15/19. Repeat every 5 years  Mammogram status: Completed 04/22/21. Repeat every year  Bone Density status: Completed 10/14/13. Results reflect: Bone density results: OSTEOPENIA. Repeat every 2 years.  Additional Screening:  Hepatitis C Screening: Completed 01/14/16  Vision Screening: Recommended annual ophthalmology exams for early detection of glaucoma and other disorders of the eye. Is the patient up to date with their annual eye exam?  Yes  Who is the provider or what is  the name of the office in which the patient attends annual eye exams? Dr Prudencio Burly  If pt is not established with a provider, would they like to be referred to a provider to establish care? No .   Dental Screening: Recommended annual dental exams for proper oral hygiene  Community Resource Referral / Chronic Care Management: CRR required this visit?  No   CCM required this visit?  No      Plan:     I have personally reviewed and noted the following in the patient's chart:   Medical and social history Use of alcohol, tobacco or illicit drugs  Current medications and supplements including opioid prescriptions.  Functional ability and status Nutritional status Physical activity Advanced directives List of other physicians Hospitalizations, surgeries, and ER visits in previous 12  months Vitals Screenings to include cognitive, depression, and falls Referrals and appointments  In addition, I have reviewed and discussed with patient certain preventive protocols, quality metrics, and best practice recommendations. A written personalized care plan for preventive services as well as general preventive health recommendations were provided to patient.     Willette Brace, LPN   07/09/3845   Nurse Notes: None

## 2022-03-28 ENCOUNTER — Other Ambulatory Visit: Payer: Self-pay | Admitting: Obstetrics and Gynecology

## 2022-03-28 DIAGNOSIS — Z1231 Encounter for screening mammogram for malignant neoplasm of breast: Secondary | ICD-10-CM

## 2022-04-21 NOTE — Progress Notes (Addendum)
04/25/2022 Miranda Gonzales [id number] 1955/12/04  Referring provider: Ann Held, * Primary GI doctor: Dr. Fuller Plan  ASSESSMENT AND PLAN:   Dyspepsia with nausea and vomiting intermittent  Will proceed with RUQ [place] with family history and symptoms Check labs Take PPI daily for 1 month Information given If not better will proceed with EGD but no alarm symptoms at this time -     CBC with Differential/Platelet; Future -     Comprehensive metabolic panel; Future -     US ABDOMEN LIMITED RUQ (LIVER/GB); Future -     H. pylori antibody, IgG; Future  Elevated LFTs -     CBC with Differential/Platelet; Future -     Comprehensive metabolic panel; Future -     US ABDOMEN LIMITED RUQ (LIVER/GB); Future -     H. pylori antibody, IgG; Future  History of adenomatous polyp of colon 08/15/2019 colonoscopy for family history of colon cancer personal history of adenomatous polyps with Dr. Fuller Plan good bowel prep eternal hemorrhoids otherwise normal recall 5 years (07/2024)    History of Present Illness:  66 y.o. female  with a past medical history of hypertension, hyperlipidemia, anxiety, GERD and others listed below, returns to clinic today for evaluation of epigastric pain.  08/15/2019 colonoscopy for family history of colon cancer personal history of adenomatous polyps with Dr. Fuller Plan good bowel prep eternal hemorrhoids otherwise normal recall 5 years (07/2024) 11/16/2021 ALT 43, AST 28, alk phos 83, very mild elevation of ALT intermittently since 2021 06/07/2021 no anemia, no leukocytosis, normal platelets, pylori antibody negative lipase normal  She states she has had several months of epigastric pain associated with nausea, Has had 2-3 episodes of vomiting with this.  She had a large bowl of popcorn and had vomiting after that.  She has indigestion for several months, worse with peanut butter, cinnamon and tumeric.  No AB pain, no radiation to back.  No melena, no dysphagia.   She has pantoprazole and takes it as needed which helps.  Denies NSAIDS.  No ETOH, no smoking.  Her mother had GB removal.   Current Medications:    Current Outpatient Medications (Cardiovascular):    simvastatin (ZOCOR) 20 MG tablet, TAKE 1 TABLET(20 MG) BY MOUTH AT BEDTIME     Current Outpatient Medications (Other):    Acetylcarnitine HCl (ACETYL L-CARNITINE PO), Take 1 tablet by mouth daily.   Alpha-Lipoic Acid 300 MG CAPS, Take by mouth.   b complex vitamins tablet, Take 1 tablet by mouth daily. B-50   CINNAMON PO, Take 6,000 mg by mouth. [place] 6032m once daily   COVID-19 mRNA bivalent vaccine, Pfizer, (PFIZER COVID-19 VAC BIVALENT) injection, Inject into the muscle.   docusate sodium (COLACE) 100 MG capsule, Take 100 mg by mouth daily.   escitalopram (LEXAPRO) 10 MG tablet, Take 1 tablet (10 mg total) by mouth in the morning.   Lysine 500 MG TABS, Take by mouth.   Nutritional Supplements (DHEA PO), Take by mouth daily.   OVER THE COUNTER MEDICATION, Iron, one tablet daily.   OVER THE COUNTER MEDICATION, Tumeric, one capsule daily.   OVER THE COUNTER MEDICATION, Apple Cider Vinegar, one capsule daily.   OVER THE COUNTER MEDICATION, Eye vitamin, doesn't know the name.   OVER THE COUNTER MEDICATION, Chondrontin- glucosime,  Two tablets daily.   OVER THE COUNTER MEDICATION, Magnesium, two tablets daily.   OVER THE COUNTER MEDICATION, Calcium, 4 tablets daily.   OVER THE COUNTER MEDICATION, Vitamin E, one capsule  daily.   OVER THE COUNTER MEDICATION, Beta carotene, one capsule daily.   OVER THE COUNTER MEDICATION, Garlic, one tablet daily.   OVER THE COUNTER MEDICATION, MSM- one tablet daily.   OVER THE COUNTER MEDICATION, 100 mg. DIM 137m once daily   OVER THE COUNTER MEDICATION, Take 200 mg by mouth daily. Chromium picolinate 2051monce dayl   OVER THE COUNTER MEDICATION, Sulforaphone 40042mnce daily   pantoprazole (PROTONIX) 40 MG tablet, TAKE 1 TABLET(40 MG) BY MOUTH  DAILY  Surgical History:  She  has a past surgical history that includes Colonoscopy w/ polypectomy (04/2009); Tubal ligation; Colonoscopy with propofol (N/A, 08/12/2014); and Uterine polyps. Family History:  Her family history includes Breast cancer in her maternal aunt; Cancer in her mother; Colon polyps in her father; Diabetes in her mother; Heart attack in her father; Hyperlipidemia in her sister; Hypertension in her mother and sister; Rectal cancer in her mother; Stroke in her mother. Social History:   reports that she has never smoked. She has never used smokeless tobacco. She reports that she does not drink alcohol and does not use drugs.  Current Medications, Allergies, Past Medical History, Past Surgical History, Family History and Social History were reviewed in [employer].  Physical Exam: BP (!) 160/90   Pulse 84   Ht _0  (1.575 m)   Wt 186 lb (84.4 kg)   BMI 34.02 kg/m  General:   Pleasant, well developed female in no acute distress Heart : Regular rate and rhythm; no murmurs Pulm: Clear anteriorly; no wheezing Abdomen: Soft, obese AB, normal skin exam, active bowel sounds, tenderness RUQ, without murphy's sign. No hepatomegaly appreciated.  Rectal: Not evaluated Extremities:  without  edema. Neurologic:  Alert and  oriented x4;  No focal deficits.  Psych:  Cooperative. Normal mood and affect.   AmaVladimir CroftsA-C 04/25/22

## 2022-04-25 ENCOUNTER — Ambulatory Visit
Admission: RE | Admit: 2022-04-25 | Discharge: 2022-04-25 | Disposition: A | Discharge status: Home / Self Care | Payer: PPO | Source: Ambulatory Visit | Attending: Obstetrics and Gynecology | Admitting: Obstetrics and Gynecology

## 2022-04-25 ENCOUNTER — Other Ambulatory Visit (INDEPENDENT_AMBULATORY_CARE_PROVIDER_SITE_OTHER): Payer: PPO

## 2022-04-25 ENCOUNTER — Ambulatory Visit: Payer: PPO | Admitting: Physician Assistant

## 2022-04-25 ENCOUNTER — Encounter: Payer: Self-pay | Admitting: Physician Assistant

## 2022-04-25 VITALS — BP 160/90 | HR 84 | Ht 62.0 in | Wt 186.0 lb

## 2022-04-25 DIAGNOSIS — R1013 Epigastric pain: Secondary | ICD-10-CM | POA: Diagnosis not present

## 2022-04-25 DIAGNOSIS — Z86010 Personal history of colonic polyps: Secondary | ICD-10-CM

## 2022-04-25 DIAGNOSIS — R7989 Other specified abnormal findings of blood chemistry: Secondary | ICD-10-CM

## 2022-04-25 DIAGNOSIS — R112 Nausea with vomiting, unspecified: Secondary | ICD-10-CM

## 2022-04-25 DIAGNOSIS — Z1231 Encounter for screening mammogram for malignant neoplasm of breast: Secondary | ICD-10-CM

## 2022-04-25 LAB — COMPREHENSIVE METABOLIC PANEL
ALT: 30 U/L (ref 0–35)
AST: 19 U/L (ref 0–37)
Albumin: 4.2 g/dL (ref 3.5–5.2)
Alkaline Phosphatase: 79 U/L (ref 39–117)
BUN: 22 mg/dL (ref 6–23)
CO2: 28 mEq/L (ref 19–32)
Calcium: 9.6 mg/dL (ref 8.4–10.5)
Chloride: 105 mEq/L (ref 96–112)
Creatinine, Ser: 0.82 mg/dL (ref 0.40–1.20)
GFR: 74.08 mL/min (ref >60.00)
Glucose, Bld: 114 mg/dL — ABNORMAL HIGH (ref 70–99)
Potassium: 4.4 mEq/L (ref 3.5–5.1)
Sodium: 143 mEq/L (ref 135–145)
Total Bilirubin: 0.3 mg/dL (ref 0.2–1.2)
Total Protein: 6.9 g/dL (ref 6.0–8.3)

## 2022-04-25 LAB — CBC WITH DIFFERENTIAL/PLATELET
Basophils Absolute: 0.1 10*3/uL (ref 0.0–0.1)
Basophils Relative: 0.7 % (ref 0.0–3.0)
Eosinophils Absolute: 0.4 10*3/uL (ref 0.0–0.7)
Eosinophils Relative: 5.5 % — ABNORMAL HIGH (ref 0.0–5.0)
HCT: 41.1 % (ref 36.0–46.0)
Hemoglobin: 13.1 g/dL (ref 12.0–15.0)
Lymphocytes Relative: 19.3 % (ref 12.0–46.0)
Lymphs Abs: 1.4 10*3/uL (ref 0.7–4.0)
MCHC: 31.9 g/dL (ref 30.0–36.0)
MCV: 84 fl (ref 78.0–100.0)
Monocytes Absolute: 0.7 10*3/uL (ref 0.1–1.0)
Monocytes Relative: 9.6 % (ref 3.0–12.0)
Neutro Abs: 4.8 10*3/uL (ref 1.4–7.7)
Neutrophils Relative %: 64.9 % (ref 43.0–77.0)
Platelets: 269 10*3/uL (ref 150.0–400.0)
RBC: 4.89 Mil/uL (ref 3.87–5.11)
RDW: 15.8 % — ABNORMAL HIGH (ref 11.5–15.5)
WBC: 7.3 10*3/uL (ref 4.0–10.5)

## 2022-04-25 LAB — H. PYLORI ANTIBODY, IGG: H Pylori IgG: NEGATIVE

## 2022-04-25 NOTE — Patient Instructions (Addendum)
Your provider has requested that you go to the basement level for lab work before leaving today. Press "B" on the elevator. The lab is located at the first door on the left as you exit the elevator.  You will be contacted by [place] in the next 2 days to arrange a RUQ Ultrasound.  The number on your caller ID will be [phone], please answer when they call.  If you have not heard from them in 2 days please call [phone] to schedule.    You have been scheduled for an Right Upper Quadrant abdominal ultrasound at [facility] (1st floor of hospital) on ____________ at _____________. Please arrive 15 minutes prior to your appointment for registration. Make certain not to have anything to eat or drink 6 hours prior to your appointment. Should you need to reschedule your appointment, please contact radiology at [phone]. This test typically takes about 30 minutes to perform.    Please take your proton pump inhibitor medication, pantoprazole daily for at least 4 weeks, then can take every other day or as needed.    Please take this medication 30 minutes to 1 hour before meals- this makes it more effective.  Avoid spicy and acidic foods Avoid fatty foods Limit your intake of coffee, tea, alcohol, and carbonated drinks Work to maintain a healthy weight Keep the head of the bed elevated at least 3 inches with blocks or a wedge pillow if you are having any nighttime symptoms Stay upright for 2 hours after eating Avoid meals and snacks three to four hours before bedtime   Go to the ER if you have any yellowing of the skin/eyes, dark urine, severe AB pain, fever, chills, nausea or vomiting.   Due to recent changes in healthcare laws, you may see the results of your imaging and laboratory studies on MyChart before your provider has had a chance to review them.  We understand that in some cases there may be results that are confusing or concerning to you. Not all  laboratory results come back in the same time frame and the provider may be waiting for multiple results in order to interpret others.  Please give [place] 48 hours in order for your provider to thoroughly review all the results before contacting the office for clarification of your results.    If you are age 23 or older, your body mass index should be between 23-30. Your Body mass index is 34.02 kg/m. If this is out of the aforementioned range listed, please consider follow up with your Primary Care Provider.  If you are age 76 or younger, your body mass index should be between 19-25. Your Body mass index is 34.02 kg/m. If this is out of the aformentioned range listed, please consider follow up with your Primary Care Provider.   ________________________________________________________  The  GI providers would like to encourage you to use [facility] to communicate with providers for non-urgent requests or questions.  Due to long hold times on the telephone, sending your provider a message by [facility] may be a faster and more efficient way to get a response.  Please allow 48 business hours for a response.  Please remember that this is for non-urgent requests.  _______________________________________________________   I appreciate the  opportunity to care for you  Thank You   [facility]

## 2022-05-05 ENCOUNTER — Ambulatory Visit (HOSPITAL_COMMUNITY)
Admission: RE | Admit: 2022-05-05 | Discharge: 2022-05-05 | Disposition: A | Discharge status: Home / Self Care | Payer: PPO | Source: Ambulatory Visit | Attending: Physician Assistant | Admitting: Physician Assistant

## 2022-05-05 DIAGNOSIS — R112 Nausea with vomiting, unspecified: Secondary | ICD-10-CM | POA: Diagnosis not present

## 2022-05-05 DIAGNOSIS — R1013 Epigastric pain: Secondary | ICD-10-CM | POA: Diagnosis not present

## 2022-05-05 DIAGNOSIS — R7989 Other specified abnormal findings of blood chemistry: Secondary | ICD-10-CM | POA: Insufficient documentation

## 2022-05-05 DIAGNOSIS — R945 Abnormal results of liver function studies: Secondary | ICD-10-CM | POA: Diagnosis not present

## 2022-05-11 ENCOUNTER — Ambulatory Visit: Payer: PPO | Admitting: Podiatry

## 2022-05-11 ENCOUNTER — Telehealth: Payer: Self-pay | Admitting: Podiatry

## 2022-05-11 DIAGNOSIS — M7752 Other enthesopathy of left foot: Secondary | ICD-10-CM | POA: Diagnosis not present

## 2022-05-11 DIAGNOSIS — M7751 Other enthesopathy of right foot: Secondary | ICD-10-CM | POA: Diagnosis not present

## 2022-05-11 NOTE — Telephone Encounter (Signed)
Patient called stating she is going to the beach and wanted to know if she could put her feet in the ocean.   Please advise.

## 2022-05-11 NOTE — Progress Notes (Signed)
Subjective:  Patient ID: Miranda Gonzales, female    DOB: 19-Mar-1956,  MRN: [id number]  Chief Complaint  Patient presents with   Callouses    Right foot callus on 2nd toe. Has been there about 2 months.    Toe Pain    Patient presents with pain to 5th toe nail on lateral border.     66 y.o. female presents with the above complaint.  Patient presents with complaint of right second MTP pain as well as left fifth digit pain.  Patient states pain for touch is progressive gotten worse.  Hurts with ambulation.  She has been doing this capsulitis for quite some time.  She denies seeing anyone else prior to seeing me.  She denies any other acute complaints pain scale 7 out of 10 hurts with ambulation and hurts with some type of shoes.  She wanted to get it evaluated.   Review of Systems: Negative except as noted in the HPI. Denies N/V/F/Ch.  Past Medical History:  Diagnosis Date   Anxiety    GERD (gastroesophageal reflux disease)    Hyperlipidemia    Hypertension    Osteoporosis     Current Outpatient Medications:    Acetylcarnitine HCl (ACETYL L-CARNITINE PO), Take 1 tablet by mouth daily., Disp: , Rfl:    Alpha-Lipoic Acid 300 MG CAPS, Take by mouth., Disp: , Rfl:    b complex vitamins tablet, Take 1 tablet by mouth daily. B-50, Disp: , Rfl:    CINNAMON PO, Take 6,000 mg by mouth. [place] '6000mg'$  once daily, Disp: , Rfl:    COVID-19 mRNA bivalent vaccine, Pfizer, (PFIZER COVID-19 VAC BIVALENT) injection, Inject into the muscle., Disp: 0.3 mL, Rfl: 0   docusate sodium (COLACE) 100 MG capsule, Take 100 mg by mouth daily., Disp: , Rfl:    escitalopram (LEXAPRO) 10 MG tablet, Take 1 tablet (10 mg total) by mouth in the morning., Disp: 90 tablet, Rfl: 3   Lysine 500 MG TABS, Take by mouth., Disp: , Rfl:    Nutritional Supplements (DHEA PO), Take by mouth daily., Disp: , Rfl:    OVER THE COUNTER MEDICATION, Iron, one tablet daily., Disp: , Rfl:    OVER THE COUNTER MEDICATION, Tumeric, one  capsule daily., Disp: , Rfl:    OVER THE COUNTER MEDICATION, Apple Cider Vinegar, one capsule daily., Disp: , Rfl:    OVER THE COUNTER MEDICATION, Eye vitamin, doesn't know the name., Disp: , Rfl:    OVER THE COUNTER MEDICATION, Chondrontin- glucosime,  Two tablets daily., Disp: , Rfl:    OVER THE COUNTER MEDICATION, Magnesium, two tablets daily., Disp: , Rfl:    OVER THE COUNTER MEDICATION, Calcium, 4 tablets daily., Disp: , Rfl:    OVER THE COUNTER MEDICATION, Vitamin E, one capsule daily., Disp: , Rfl:    OVER THE COUNTER MEDICATION, Beta carotene, one capsule daily., Disp: , Rfl:    OVER THE COUNTER MEDICATION, Garlic, one tablet daily., Disp: , Rfl:    OVER THE COUNTER MEDICATION, MSM- one tablet daily., Disp: , Rfl:    OVER THE COUNTER MEDICATION, 100 mg. DIM '100mg'$  once daily, Disp: , Rfl:    OVER THE COUNTER MEDICATION, Take 200 mg by mouth daily. Chromium picolinate '200mg'$  once dayl, Disp: , Rfl:    OVER THE COUNTER MEDICATION, Sulforaphone '400mg'$  once daily, Disp: , Rfl:    pantoprazole (PROTONIX) 40 MG tablet, TAKE 1 TABLET(40 MG) BY MOUTH DAILY, Disp: 90 tablet, Rfl: 1   simvastatin (ZOCOR) 20 MG tablet, TAKE  1 TABLET(20 MG) BY MOUTH AT BEDTIME, Disp: 90 tablet, Rfl: 1  Social History   Tobacco Use  Smoking Status Never  Smokeless Tobacco Never    Allergies  Allergen Reactions   Penicillins Itching and Rash   Bacitracin Hives   Objective:  There were no vitals filed for this visit. There is no height or weight on file to calculate BMI. Constitutional Well developed. Well nourished.  Vascular Dorsalis pedis pulses palpable bilaterally. Posterior tibial pulses palpable bilaterally. Capillary refill normal to all digits.  No cyanosis or clubbing noted. Pedal hair growth normal.  Neurologic Normal speech. Oriented to person, place, and time. Epicritic sensation to light touch grossly present bilaterally.  Dermatologic Hyperkeratotic lesion with central nucleated core  noted to left fifth digit with underlying capsulitis.  Pain on palpation to the fifth PIPJ joint.  Pain on palpation of right second metatarsophalangeal joint.  No extensor or flexor tendinitis noted.  No hammertoe contractures noted.  Orthopedic: Normal joint ROM without pain or crepitus bilaterally. No visible deformities. No bony tenderness.   Radiographs: None Assessment:   1. Capsulitis of metatarsophalangeal (MTP) joint of right foot   2. Capsulitis of toe, left    Plan:  Patient was evaluated and treated and all questions answered.  Left fifth PIPJ capsulitis and right second MTP capsulitis -All questions and concerns were discussed with the patient extensive detail given the amount of pain that she is having she will benefit from a steroid injection of decrease acute inflammatory component associate with pain.  Patient agrees with plan like to proceed with steroid injection. -A steroid injection was performed at left fifth PIPJ joint and right second MTPJ joint using 1% plain Lidocaine and 10 mg of Kenalog. This was well tolerated.   No follow-ups on file.

## 2022-05-16 ENCOUNTER — Ambulatory Visit: Payer: PPO | Admitting: Podiatry

## 2022-05-19 ENCOUNTER — Ambulatory Visit (INDEPENDENT_AMBULATORY_CARE_PROVIDER_SITE_OTHER): Payer: PPO | Admitting: Family Medicine

## 2022-05-19 ENCOUNTER — Encounter: Payer: Self-pay | Admitting: Family Medicine

## 2022-05-19 VITALS — BP 138/88 | HR 81 | Temp 98.1°F | Resp 18 | Ht 62.0 in | Wt 186.6 lb

## 2022-05-19 DIAGNOSIS — Z0000 Encounter for general adult medical examination without abnormal findings: Secondary | ICD-10-CM | POA: Diagnosis not present

## 2022-05-19 DIAGNOSIS — E1165 Type 2 diabetes mellitus with hyperglycemia: Secondary | ICD-10-CM

## 2022-05-19 DIAGNOSIS — E785 Hyperlipidemia, unspecified: Secondary | ICD-10-CM

## 2022-05-19 DIAGNOSIS — E119 Type 2 diabetes mellitus without complications: Secondary | ICD-10-CM | POA: Diagnosis not present

## 2022-05-19 DIAGNOSIS — E1169 Type 2 diabetes mellitus with other specified complication: Secondary | ICD-10-CM

## 2022-05-19 DIAGNOSIS — E559 Vitamin D deficiency, unspecified: Secondary | ICD-10-CM | POA: Diagnosis not present

## 2022-05-19 DIAGNOSIS — I10 Essential (primary) hypertension: Secondary | ICD-10-CM | POA: Diagnosis not present

## 2022-05-19 LAB — CBC WITH DIFFERENTIAL/PLATELET
Basophils Absolute: 0.1 10*3/uL (ref 0.0–0.1)
Basophils Relative: 0.7 % (ref 0.0–3.0)
Eosinophils Absolute: 0.3 10*3/uL (ref 0.0–0.7)
Eosinophils Relative: 3.4 % (ref 0.0–5.0)
HCT: 40.8 % (ref 36.0–46.0)
Hemoglobin: 13 g/dL (ref 12.0–15.0)
Lymphocytes Relative: 17.8 % (ref 12.0–46.0)
Lymphs Abs: 1.4 10*3/uL (ref 0.7–4.0)
MCHC: 31.8 g/dL (ref 30.0–36.0)
MCV: 84 fl (ref 78.0–100.0)
Monocytes Absolute: 0.7 10*3/uL (ref 0.1–1.0)
Monocytes Relative: 8.9 % (ref 3.0–12.0)
Neutro Abs: 5.6 10*3/uL (ref 1.4–7.7)
Neutrophils Relative %: 69.2 % (ref 43.0–77.0)
Platelets: 299 10*3/uL (ref 150.0–400.0)
RBC: 4.86 Mil/uL (ref 3.87–5.11)
RDW: 15.7 % — ABNORMAL HIGH (ref 11.5–15.5)
WBC: 8.1 10*3/uL (ref 4.0–10.5)

## 2022-05-19 LAB — COMPREHENSIVE METABOLIC PANEL
ALT: 33 U/L (ref 0–35)
AST: 21 U/L (ref 0–37)
Albumin: 4 g/dL (ref 3.5–5.2)
Alkaline Phosphatase: 84 U/L (ref 39–117)
BUN: 17 mg/dL (ref 6–23)
CO2: 32 mEq/L (ref 19–32)
Calcium: 9.4 mg/dL (ref 8.4–10.5)
Chloride: 105 mEq/L (ref 96–112)
Creatinine, Ser: 0.89 mg/dL (ref 0.40–1.20)
GFR: 67.12 mL/min (ref >60.00)
Glucose, Bld: 108 mg/dL — ABNORMAL HIGH (ref 70–99)
Potassium: 4.8 mEq/L (ref 3.5–5.1)
Sodium: 143 mEq/L (ref 135–145)
Total Bilirubin: 0.4 mg/dL (ref 0.2–1.2)
Total Protein: 6.4 g/dL (ref 6.0–8.3)

## 2022-05-19 LAB — LIPID PANEL
Cholesterol: 136 mg/dL (ref 0–200)
HDL: 47.6 mg/dL (ref >39.00)
LDL Cholesterol: 76 mg/dL (ref 0–99)
NonHDL: 88.65
Total CHOL/HDL Ratio: 3
Triglycerides: 65 mg/dL (ref 0.0–149.0)
VLDL: 13 mg/dL (ref 0.0–40.0)

## 2022-05-19 LAB — VITAMIN D 25 HYDROXY (VIT D DEFICIENCY, FRACTURES): VITD: 26.67 ng/mL — ABNORMAL LOW (ref 30.00–100.00)

## 2022-05-19 LAB — HEMOGLOBIN A1C: Hgb A1c MFr Bld: 6.7 % — ABNORMAL HIGH (ref 4.6–6.5)

## 2022-05-19 MED ORDER — TIRZEPATIDE 2.5 MG/0.5ML ~~LOC~~ SOAJ: 2.5000 mg | SUBCUTANEOUS | 2 refills | Status: DC

## 2022-05-19 NOTE — Assessment & Plan Note (Signed)
Encourage heart healthy diet such as MIND or DASH diet, increase exercise, avoid trans fats, simple carbohydrates and processed foods, consider a krill or fish or flaxseed oil cap daily.  °

## 2022-05-19 NOTE — Assessment & Plan Note (Signed)
Well controlled, no changes to meds. Encouraged heart healthy diet such as the DASH diet and exercise as tolerated.  °

## 2022-05-19 NOTE — Assessment & Plan Note (Signed)
Check labs  hgba1c to be checked, minimize simple carbs. Increase exercise as tolerated. Continue current meds  

## 2022-05-19 NOTE — Progress Notes (Signed)
Subjective:   By signing my name below, I, Luiz Ochoa, attest that this documentation has been prepared under the direction and in the presence of Ann Held, DO  05/19/2022   Patient ID: Miranda Gonzales, female    DOB: 1956-10-06, 66 y.o.   MRN: [id number]  Chief Complaint  Patient presents with   Annual Exam    Pt states fasting     HPI Patient is in today for comprehensive physical.  She admits that she has not been exercising regularly. She prefers taking to take [facility], but does not want to pay $1500 each month. She is UTD with her Covid-19 vaccines.   No changes in family history, and goes to the breast center regularly. She takes medication to manage her abdominal discomfort. When she went to the GI physician and reports that everything was normal.  She denies having any fever, new moles, congestion, sinus pain, sore throat, chest pain, palpitations, cough, shortness of breath, wheezing, nausea, vomiting, diarrhea, constipation, dysuria, frequency, abdominal pain, hematuria, new muscle pain, new joint pain, headaches.  Past Medical History:  Diagnosis Date   Anxiety    GERD (gastroesophageal reflux disease)    Hyperlipidemia    Hypertension    Osteoporosis     Past Surgical History:  Procedure Laterality Date   COLONOSCOPY W/ POLYPECTOMY  04/2009   due 2015   COLONOSCOPY WITH PROPOFOL N/A 08/12/2014   Procedure: COLONOSCOPY WITH PROPOFOL;  Surgeon: Garlan Fair, MD;  Location: [facility];  Service: Endoscopy;  Laterality: N/A;   TUBAL LIGATION     Uterine polyps      Family History  Problem Relation Age of Onset   Cancer Mother        rectal   Diabetes Mother    Hypertension Mother    Stroke Mother        in 69s   Rectal cancer Mother    Colon polyps Father    Heart attack Father        15 & 30   Hyperlipidemia Sister    Hypertension Sister    Breast cancer Maternal Aunt    Colon cancer Neg Hx    Esophageal cancer Neg Hx     Ulcerative colitis Neg Hx     Social History   Socioeconomic History   Marital status: Married    Spouse name: Not on file   Number of children: Not on file   Years of education: Not on file   Highest education level: Not on file  Occupational History   Occupation: retired Engineer, water  Tobacco Use   Smoking status: Never   Smokeless tobacco: Never  Vaping Use   Vaping Use: Never used  Substance and Sexual Activity   Alcohol use: No   Drug use: No   Sexual activity: Yes    Partners: Male  Other Topics Concern   Not on file  Social History Narrative   Not on file   Social Determinants of Health   Financial Resource Strain: Low Risk  (03/08/2022)   Overall Financial Resource Strain (CARDIA)    Difficulty of Paying Living Expenses: Not hard at all  Food Insecurity: No Food Insecurity (03/08/2022)   Hunger Vital Sign    Worried About Running Out of Food in the Last Year: Never true    Ran Out of Food in the Last Year: Never true  Transportation Needs: No Transportation Needs (03/08/2022)   PRAPARE - Transportation    Lack of  Transportation (Medical): No    Lack of Transportation (Non-Medical): No  Physical Activity: Sufficiently Active (03/08/2022)   Exercise Vital Sign    Days of Exercise per Week: 3 days    Minutes of Exercise per Session: 90 min  Stress: No Stress Concern Present (03/08/2022)   [place]    Feeling of Stress : Not at all  Social Connections: Moderately Integrated (03/08/2022)   Social Connection and Isolation Panel [NHANES]    Frequency of Communication with Friends and Family: Twice a week    Frequency of Social Gatherings with Friends and Family: More than three times a week    Attends Religious Services: More than 4 times per year    Active Member of [employer] or Organizations: No    Attends Archivist Meetings: Never    Marital Status: Married  Human resources officer Violence: Not At Risk  (03/08/2022)   Humiliation, Afraid, Rape, and Kick questionnaire    Fear of Current or Ex-Partner: No    Emotionally Abused: No    Physically Abused: No    Sexually Abused: No    Outpatient Medications Prior to Visit  Medication Sig Dispense Refill   Acetylcarnitine HCl (ACETYL L-CARNITINE PO) Take 1 tablet by mouth daily.     Alpha-Lipoic Acid 300 MG CAPS Take by mouth.     b complex vitamins tablet Take 1 tablet by mouth daily. B-50     CINNAMON PO Take 6,000 mg by mouth. [place] '6000mg'$  once daily     docusate sodium (COLACE) 100 MG capsule Take 100 mg by mouth daily.     escitalopram (LEXAPRO) 10 MG tablet Take 1 tablet (10 mg total) by mouth in the morning. 90 tablet 3   Lysine 500 MG TABS Take by mouth.     Nutritional Supplements (DHEA PO) Take by mouth daily.     OVER THE COUNTER MEDICATION Iron, one tablet daily.     OVER THE COUNTER MEDICATION Tumeric, one capsule daily.     OVER THE COUNTER MEDICATION Apple Cider Vinegar, one capsule daily.     OVER THE COUNTER MEDICATION Eye vitamin, doesn't know the name.     OVER THE COUNTER MEDICATION Chondrontin- glucosime,  Two tablets daily.     OVER THE COUNTER MEDICATION Magnesium, two tablets daily.     OVER THE COUNTER MEDICATION Calcium, 4 tablets daily.     OVER THE COUNTER MEDICATION Vitamin E, one capsule daily.     OVER THE COUNTER MEDICATION Beta carotene, one capsule daily.     OVER THE COUNTER MEDICATION Garlic, one tablet daily.     OVER THE COUNTER MEDICATION MSM- one tablet daily.     OVER THE COUNTER MEDICATION 100 mg. DIM '100mg'$  once daily     OVER THE COUNTER MEDICATION Take 200 mg by mouth daily. Chromium picolinate '200mg'$  once dayl     OVER THE COUNTER MEDICATION Sulforaphone '400mg'$  once daily     pantoprazole (PROTONIX) 40 MG tablet TAKE 1 TABLET(40 MG) BY MOUTH DAILY 90 tablet 1   simvastatin (ZOCOR) 20 MG tablet TAKE 1 TABLET(20 MG) BY MOUTH AT BEDTIME 90 tablet 1   COVID-19 mRNA bivalent vaccine, Pfizer, (PFIZER  COVID-19 VAC BIVALENT) injection Inject into the muscle. (Patient not taking: Reported on 05/19/2022) 0.3 mL 0   No facility-administered medications prior to visit.    Allergies  Allergen Reactions   Penicillins Itching and Rash   Bacitracin Hives    Review  of Systems  Constitutional:  Negative for fever.  HENT:  Negative for congestion, sinus pain and sore throat.   Respiratory:  Negative for cough, shortness of breath and wheezing.   Cardiovascular:  Negative for chest pain and palpitations.  Gastrointestinal:  Negative for abdominal pain, constipation, diarrhea, nausea and vomiting.  Genitourinary:  Negative for dysuria, frequency and hematuria.  Musculoskeletal:  Negative for joint pain and myalgias.  Skin:        (-) [place]  Neurological:  Negative for headaches.       Objective:    Physical Exam Constitutional:      Appearance: Normal appearance. She is not ill-appearing.  HENT:     Head: Normocephalic and atraumatic.     Right Ear: Tympanic membrane, ear canal and external ear normal.     Left Ear: Tympanic membrane, ear canal and external ear normal.  Eyes:     Extraocular Movements: Extraocular movements intact.     Pupils: Pupils are equal, round, and reactive to light.  Cardiovascular:     Rate and Rhythm: Normal rate and regular rhythm.     Pulses: Normal pulses.     Heart sounds: Normal heart sounds. No murmur heard.    No gallop.  Pulmonary:     Effort: Pulmonary effort is normal. No respiratory distress.     Breath sounds: Normal breath sounds. No wheezing or rales.  Abdominal:     General: Bowel sounds are normal. There is no distension.     Palpations: Abdomen is soft.     Tenderness: There is no abdominal tenderness. There is no guarding.  Skin:    General: Skin is warm and dry.  Neurological:     Mental Status: She is alert and oriented to person, place, and time.  Psychiatric:        Judgment: Judgment normal.     BP 138/88 (BP  Location: Left Arm, Patient Position: Sitting, Cuff Size: Normal)   Pulse 81   Temp 98.1 F (36.7 C) (Oral)   Resp 18   Ht '5\' 2"'$  (1.575 m)   Wt 186 lb 9.6 oz (84.6 kg)   SpO2 96%   BMI 34.13 kg/m  Wt Readings from Last 3 Encounters:  05/19/22 186 lb 9.6 oz (84.6 kg)  04/25/22 186 lb (84.4 kg)  11/16/21 178 lb 9.6 oz (81 kg)    Diabetic Foot Exam - Simple   No data filed    Lab Results  Component Value Date   WBC 8.1 05/19/2022   HGB 13.0 05/19/2022   HCT 40.8 05/19/2022   PLT 299.0 05/19/2022   GLUCOSE 108 (H) 05/19/2022   CHOL 136 05/19/2022   TRIG 65.0 05/19/2022   HDL 47.60 05/19/2022   LDLCALC 76 05/19/2022   ALT 33 05/19/2022   AST 21 05/19/2022   NA 143 05/19/2022   K 4.8 05/19/2022   CL 105 05/19/2022   CREATININE 0.89 05/19/2022   BUN 17 05/19/2022   CO2 32 05/19/2022   TSH 2.05 03/25/2015   HGBA1C 6.7 (H) 05/19/2022   MICROALBUR 1.2 11/16/2021    Lab Results  Component Value Date   TSH 2.05 03/25/2015   Lab Results  Component Value Date   WBC 8.1 05/19/2022   HGB 13.0 05/19/2022   HCT 40.8 05/19/2022   MCV 84.0 05/19/2022   PLT 299.0 05/19/2022   Lab Results  Component Value Date   NA 143 05/19/2022   K 4.8 05/19/2022   CO2 32 05/19/2022  GLUCOSE 108 (H) 05/19/2022   BUN 17 05/19/2022   CREATININE 0.89 05/19/2022   BILITOT 0.4 05/19/2022   ALKPHOS 84 05/19/2022   AST 21 05/19/2022   ALT 33 05/19/2022   PROT 6.4 05/19/2022   ALBUMIN 4.0 05/19/2022   CALCIUM 9.4 05/19/2022   ANIONGAP 5 10/05/2020   GFR 67.12 05/19/2022   Lab Results  Component Value Date   CHOL 136 05/19/2022   Lab Results  Component Value Date   HDL 47.60 05/19/2022   Lab Results  Component Value Date   LDLCALC 76 05/19/2022   Lab Results  Component Value Date   TRIG 65.0 05/19/2022   Lab Results  Component Value Date   CHOLHDL 3 05/19/2022   Lab Results  Component Value Date   HGBA1C 6.7 (H) 05/19/2022       Mammogram- Last completed  04/25/2022. Results are normal. Follow up in one year. Bone Density- Last completed 10/14/13, but patient states that her latest was done last year Colonoscopy-Last completed 08/15/2019, but the results have not been released at this time.  Assessment & Plan:   Problem List Items Addressed This Visit       Unprioritized   Vitamin D deficiency   Relevant Orders   VITAMIN D 25 Hydroxy (Vit-D Deficiency, Fractures) (Completed)   Hyperlipidemia   Relevant Orders   CBC with Differential/Platelet (Completed)   Comprehensive metabolic panel (Completed)   Lipid panel (Completed)   Hemoglobin A1c (Completed)   Preventative health care    ghm utd  Check labs  See avs       Hyperlipidemia associated with type 2 diabetes mellitus ([place])    Encourage heart healthy diet such as MIND or DASH diet, increase exercise, avoid trans fats, simple carbohydrates and processed foods, consider a krill or fish or flaxseed oil cap daily.       Relevant Medications   tirzepatide [facility]) 2.5 MG/0.5ML Pen   Essential hypertension    Well controlled, no changes to meds. Encouraged heart healthy diet such as the DASH diet and exercise as tolerated.       Relevant Orders   CBC with Differential/Platelet (Completed)   Comprehensive metabolic panel (Completed)   Lipid panel (Completed)   Hemoglobin A1c (Completed)   Diet-controlled diabetes mellitus ([place])    Check labs  hgba1c to be checked , minimize simple carbs. Increase exercise as tolerated. Continue current meds       Relevant Medications   tirzepatide (MOUNJARO) 2.5 MG/0.5ML Pen   Other Visit Diagnoses     Type 2 diabetes mellitus with hyperglycemia, without long-term current use of insulin ([place])    -  Primary   Relevant Medications   tirzepatide (MOUNJARO) 2.5 MG/0.5ML Pen   Other Relevant Orders   CBC with Differential/Platelet (Completed)   Comprehensive metabolic panel (Completed)   Lipid panel (Completed)   Hemoglobin A1c  (Completed)        Meds ordered this encounter  Medications   tirzepatide (MOUNJARO) 2.5 MG/0.5ML Pen    Sig: Inject 2.5 mg into the skin once a week.    Dispense:  2 mL    Refill:  2    I, Ann Held, DO, personally preformed the services described in this documentation.  All medical record entries made by the scribe were at my direction and in my presence.  I have reviewed the chart and discharge instructions (if applicable) and agree that the record reflects my personal performance and is accurate and complete.  05/19/2022   I,Tinashe Williams,acting as a scribe for Ann Held, DO.,have documented all relevant documentation on the behalf of Ann Held, DO,as directed by  Ann Held, DO while in the presence of Ann Held, DO.    Ann Held, DO

## 2022-05-19 NOTE — Assessment & Plan Note (Signed)
ghm utd Check labs  See avs  

## 2022-05-19 NOTE — Patient Instructions (Signed)
Preventive Care 65 Years and Older, Female Preventive care refers to lifestyle choices and visits with your health care provider that can promote health and wellness. Preventive care visits are also called wellness exams. What can I expect for my preventive care visit? Counseling Your health care provider may ask you questions about your: Medical history, including: Past medical problems. Family medical history. Pregnancy and menstrual history. History of falls. Current health, including: Memory and ability to understand (cognition). Emotional well-being. Home life and relationship well-being. Sexual activity and sexual health. Lifestyle, including: Alcohol, nicotine or tobacco, and drug use. Access to firearms. Diet, exercise, and sleep habits. Work and work environment. Sunscreen use. Safety issues such as seatbelt and bike helmet use. Physical exam Your health care provider will check your: Height and weight. These may be used to calculate your BMI (body mass index). BMI is a measurement that tells if you are at a healthy weight. Waist circumference. This measures the distance around your waistline. This measurement also tells if you are at a healthy weight and may help predict your risk of certain diseases, such as type 2 diabetes and high blood pressure. Heart rate and blood pressure. Body temperature. Skin for abnormal spots. What immunizations do I need?  Vaccines are usually given at various ages, according to a schedule. Your health care provider will recommend vaccines for you based on your age, medical history, and lifestyle or other factors, such as travel or where you work. What tests do I need? Screening Your health care provider may recommend screening tests for certain conditions. This may include: Lipid and cholesterol levels. Hepatitis C test. Hepatitis B test. HIV (human immunodeficiency virus) test. STI (sexually transmitted infection) testing, if you are at  risk. Lung cancer screening. Colorectal cancer screening. Diabetes screening. This is done by checking your blood sugar (glucose) after you have not eaten for a while (fasting). Mammogram. Talk with your health care provider about how often you should have regular mammograms. BRCA-related cancer screening. This may be done if you have a family history of breast, ovarian, tubal, or peritoneal cancers. Bone density scan. This is done to screen for osteoporosis. Talk with your health care provider about your test results, treatment options, and if necessary, the need for more tests. Follow these instructions at home: Eating and drinking  Eat a diet that includes fresh fruits and vegetables, whole grains, lean protein, and low-fat dairy products. Limit your intake of foods with high amounts of sugar, saturated fats, and salt. Take vitamin and mineral supplements as recommended by your health care provider. Do not drink alcohol if your health care provider tells you not to drink. If you drink alcohol: Limit how much you have to 0-1 drink a day. Know how much alcohol is in your drink. In the [place] one drink equals one 12 oz bottle of beer (355 mL), one 5 oz glass of wine (148 mL), or one 1 oz glass of hard liquor (44 mL). Lifestyle Brush your teeth every morning and night with fluoride toothpaste. Floss one time each day. Exercise for at least 30 minutes 5 or more days each week. Do not use any products that contain nicotine or tobacco. These products include cigarettes, chewing tobacco, and vaping devices, such as e-cigarettes. If you need help quitting, ask your health care provider. Do not use drugs. If you are sexually active, practice safe sex. Use a condom or other form of protection in order to prevent STIs. Take aspirin only as told by   your health care provider. Make sure that you understand how much to take and what form to take. Work with your health care provider to find out whether it  is safe and beneficial for you to take aspirin daily. Ask your health care provider if you need to take a cholesterol-lowering medicine (statin). Find healthy ways to manage stress, such as: Meditation, yoga, or listening to music. Journaling. Talking to a trusted person. Spending time with friends and family. Minimize exposure to UV radiation to reduce your risk of skin cancer. Safety Always wear your seat belt while driving or riding in a vehicle. Do not drive: If you have been drinking alcohol. Do not ride with someone who has been drinking. When you are tired or distracted. While texting. If you have been using any mind-altering substances or drugs. Wear a helmet and other protective equipment during sports activities. If you have firearms in your house, make sure you follow all gun safety procedures. What's next? Visit your health care provider once a year for an annual wellness visit. Ask your health care provider how often you should have your eyes and teeth checked. Stay up to date on all vaccines. This information is not intended to replace advice given to you by your health care provider. Make sure you discuss any questions you have with your health care provider. Document Revised: 04/07/2021 Document Reviewed: 04/07/2021 Elsevier Patient Education  2023 Elsevier Inc.  

## 2022-05-30 ENCOUNTER — Encounter: Payer: Self-pay | Admitting: Family Medicine

## 2022-05-31 ENCOUNTER — Other Ambulatory Visit: Payer: Self-pay | Admitting: Family Medicine

## 2022-05-31 DIAGNOSIS — E1165 Type 2 diabetes mellitus with hyperglycemia: Secondary | ICD-10-CM

## 2022-05-31 DIAGNOSIS — E785 Hyperlipidemia, unspecified: Secondary | ICD-10-CM

## 2022-06-01 ENCOUNTER — Telehealth: Payer: Self-pay | Admitting: Family Medicine

## 2022-06-01 ENCOUNTER — Telehealth: Payer: Self-pay

## 2022-06-01 NOTE — Telephone Encounter (Signed)
PA approved.   09-AUG-23:31-DEC-23 Quantity:; Quantity:;Mounjaro 2.'5MG'$ /0.5ML [place] SOPN Quantity:2; Quantity:; Quantity:; Quantity:;

## 2022-06-01 NOTE — Telephone Encounter (Signed)
Patient returning our call. She said the message was to see if she was okay with taking vitamin D 50,000 units and metformin. Patient is okay with those but prefers Monjauro. Pharmacy is still waiting on additional information. Please call patient back to discuss further.

## 2022-06-01 NOTE — Telephone Encounter (Signed)
PA initiated via Covermymeds; KEY: [id number]. Awaiting determination.

## 2022-06-02 ENCOUNTER — Other Ambulatory Visit: Payer: Self-pay

## 2022-06-02 MED ORDER — VITAMIN D (ERGOCALCIFEROL) 1.25 MG (50000 UNIT) PO CAPS: 50000.0000 [IU] | ORAL_CAPSULE | ORAL | 1 refills | Status: DC

## 2022-06-02 NOTE — Telephone Encounter (Signed)
Spoke with patient. Pt will take mounjaro and vit D has been sent in.

## 2022-06-02 NOTE — Telephone Encounter (Signed)
PA was approved for [facility]. Do you still want to increase metformin? Please advise

## 2022-06-10 ENCOUNTER — Other Ambulatory Visit: Payer: Self-pay | Admitting: Family Medicine

## 2022-06-10 MED ORDER — TIRZEPATIDE 5 MG/0.5ML ~~LOC~~ SOAJ: 5.0000 mg | SUBCUTANEOUS | 0 refills | Status: DC

## 2022-06-15 ENCOUNTER — Encounter: Payer: Self-pay | Admitting: Family Medicine

## 2022-06-16 ENCOUNTER — Other Ambulatory Visit: Payer: Self-pay

## 2022-06-21 ENCOUNTER — Telehealth: Payer: Self-pay | Admitting: *Deleted

## 2022-06-21 NOTE — Chronic Care Management (AMB) (Unsigned)
  Chronic Care Management   Outreach Note  06/21/2022 Name: SAADIYA WILFONG MRN: [id number] DOB: 1956-02-28  DENEISE GETTY is a 66 y.o. year old female who is a primary care patient of Ann Held, DO. I reached out to [name] by phone today in response to a referral sent by Ms. Gerri Lins Pung's primary care provider.  An unsuccessful telephone outreach was attempted today. The patient was referred to the case management team for assistance with care management and care coordination.   Follow Up Plan: A HIPAA compliant phone message was left for the patient providing contact information and requesting a return call.   Julian Hy, [place] Direct Dial: [phone]

## 2022-06-29 ENCOUNTER — Telehealth: Payer: Self-pay | Admitting: Pharmacist

## 2022-06-29 DIAGNOSIS — L570 Actinic keratosis: Secondary | ICD-10-CM | POA: Diagnosis not present

## 2022-06-29 DIAGNOSIS — X32XXXD Exposure to sunlight, subsequent encounter: Secondary | ICD-10-CM | POA: Diagnosis not present

## 2022-06-29 NOTE — Telephone Encounter (Signed)
     06/29/2022 Name: AVIANA SHEVLIN MRN: [id number] DOB: 06-13-1956   Referred by: Ann Held, DO Reason for referral : diabetes education / medication management   An unsuccessful telephone outreach was attempted today. The patient was referred to the case management team for assistance with care management and care coordination.   Follow Up Plan:  Care coordination scheduler will continue to try to reach patient to make appointment.   Cherre Robins, PharmD Clinical Pharmacist [facility]

## 2022-06-30 NOTE — Chronic Care Management (AMB) (Signed)
Per pharmacist note pt declined services at this time

## 2022-06-30 NOTE — Telephone Encounter (Signed)
Patient returned call regarding diabetes education. She declined diabetes education at this time.  I did discuss that she might reach Medicare Coverage gap since [facility] was started. Advised patient to contact Clinical Pharmacist Practitioner ff she has any questions if /when she reaches coverage gap.

## 2022-07-01 ENCOUNTER — Other Ambulatory Visit: Payer: Self-pay | Admitting: Family Medicine

## 2022-07-01 ENCOUNTER — Encounter: Payer: Self-pay | Admitting: Family Medicine

## 2022-07-01 MED ORDER — TIRZEPATIDE 7.5 MG/0.5ML ~~LOC~~ SOAJ: 7.5000 mg | SUBCUTANEOUS | 0 refills | Status: DC

## 2022-07-01 NOTE — Telephone Encounter (Signed)
Awaiting PCP response, increase to next dose of Mounjaro?

## 2022-07-21 ENCOUNTER — Other Ambulatory Visit: Payer: Self-pay | Admitting: Family Medicine

## 2022-07-21 DIAGNOSIS — E785 Hyperlipidemia, unspecified: Secondary | ICD-10-CM

## 2022-07-25 ENCOUNTER — Encounter: Payer: Self-pay | Admitting: Family Medicine

## 2022-07-25 DIAGNOSIS — R1013 Epigastric pain: Secondary | ICD-10-CM

## 2022-07-26 MED ORDER — PANTOPRAZOLE SODIUM 40 MG PO TBEC: DELAYED_RELEASE_TABLET | ORAL | 1 refills | Status: DC

## 2022-07-26 NOTE — Progress Notes (Signed)
08/01/2022 JAKI STEPTOE [id number] 12-Apr-1956  Referring provider: Carollee Herter, Alferd Apa, * Primary GI doctor: Dr. Fuller Plan  ASSESSMENT AND PLAN:   Dyspepsia with nausea and vomiting intermittent  Unremarkable RUQ [place] Was not on mounjaro when symptoms started. Will have episodes of epigastric pain, vomiting and diarrhea, has had 3 episodes since last seen. In between will not have any GERD symptoms.  Mom with history of GB removal, sounds like could be colicky in nature, will proceed with HIDA, if negative suggest stopping mounjaro and/or EGD to evaluate  Elevated LFTs Unremarkable RUQ [place] 04/2022 Continue weight loss Monitor q 6 months  History of adenomatous polyp of colon 08/15/2019 colonoscopy for family history of colon cancer personal history of adenomatous polyps with Dr. Fuller Plan good bowel prep eternal hemorrhoids otherwise normal recall 5 years (07/2024)   History of Present Illness:  66 y.o. female  with a past medical history of hypertension, hyperlipidemia, anxiety, GERD and others listed below, returns to clinic today after evaluation 04/25/2022 for dyspepsia and elevated liver function.  Review of GI history 08/15/2019 colonoscopy for family history of colon cancer personal history of adenomatous polyps with Dr. Fuller Plan good bowel prep eternal hemorrhoids otherwise normal recall 5 years (07/2024) 11/16/2021 ALT 43, AST 28, alk phos 83, very mild elevation of ALT intermittently since 2021 06/07/2021 no anemia, no leukocytosis, normal platelets, pylori antibody negative lipase normal 05/05/2022 RUQ US unremarkable labs no anemia no leukocytosis normal liver function negative H. pylori blood.  She started to take [facility] August 10th, she has lost 16 lbs.  She states august 22nd, 19th and sept 23rd, she had episode of severe diarrhea with vomiting. She has been eating [place] and drinking kambucha, taking probiotic and states she has not had any problems with diarrhea.   She states the protonix has helped some with gerd but she continues to have epigastric pain, worse after eating. Has had 3 episodes.  In between she will have normal BM, soft brown stools, no straining. No melena no hematochezia. Denies NSAIDS.  No ETOH, no smoking.  Her mother had GB removal.  Wt Readings from Last 3 Encounters:  08/01/22 170 lb (77.1 kg)  05/19/22 186 lb 9.6 oz (84.6 kg)  04/25/22 186 lb (84.4 kg)     Current Medications:   Current Outpatient Medications (Endocrine & Metabolic):    tirzepatide (MOUNJARO) 7.5 MG/0.5ML Pen, Inject 7.5 mg into the skin once a week.   tirzepatide [facility]) 5 MG/0.5ML Pen, Inject 5 mg into the skin once a week.  Current Outpatient Medications (Cardiovascular):    simvastatin (ZOCOR) 20 MG tablet, Take 1 tablet (20 mg total) by mouth at bedtime.     Current Outpatient Medications (Other):    Acetylcarnitine HCl (ACETYL L-CARNITINE PO), Take 1 tablet by mouth daily.   Alpha-Lipoic Acid 300 MG CAPS, Take by mouth.   b complex vitamins tablet, Take 1 tablet by mouth daily. B-50   CINNAMON PO, Take 6,000 mg by mouth. [place] 6089m once daily   escitalopram (LEXAPRO) 10 MG tablet, Take 1 tablet (10 mg total) by mouth in the morning.   Lysine 500 MG TABS, Take by mouth.   Nutritional Supplements (DHEA PO), Take by mouth daily.   OVER THE COUNTER MEDICATION, Iron, one tablet daily.   OVER THE COUNTER MEDICATION, Tumeric, one capsule daily.   OVER THE COUNTER MEDICATION, Apple Cider Vinegar, one capsule daily.   OVER THE COUNTER MEDICATION, Eye vitamin, doesn't know the name.  OVER THE COUNTER MEDICATION, Chondrontin- glucosime,  Two tablets daily.   OVER THE COUNTER MEDICATION, Magnesium, two tablets daily.   OVER THE COUNTER MEDICATION, Calcium, 4 tablets daily.   OVER THE COUNTER MEDICATION, Vitamin E, one capsule daily.   OVER THE COUNTER MEDICATION, Beta carotene, one capsule daily.   OVER THE COUNTER MEDICATION, Garlic, one  tablet daily.   OVER THE COUNTER MEDICATION, MSM- one tablet daily.   OVER THE COUNTER MEDICATION, 100 mg. DIM 139m once daily   OVER THE COUNTER MEDICATION, Take 200 mg by mouth daily. Chromium picolinate 2058monce dayl   OVER THE COUNTER MEDICATION, Sulforaphone 40060mnce daily   pantoprazole (PROTONIX) 40 MG tablet, TAKE 1 TABLET(40 MG) BY MOUTH DAILY   Vitamin D, Ergocalciferol, (DRISDOL) 1.25 MG (50000 UNIT) CAPS capsule, Take 1 capsule (50,000 Units total) by mouth every 7 (seven) days.  Surgical History:  She  has a past surgical history that includes Colonoscopy w/ polypectomy (04/2009); Tubal ligation; Colonoscopy with propofol (N/A, 08/12/2014); and Uterine polyps. Family History:  Her family history includes Breast cancer in her maternal aunt; Cancer in her mother; Colon polyps in her father; Diabetes in her mother; Heart attack in her father; Hyperlipidemia in her sister; Hypertension in her mother and sister; Rectal cancer in her mother; Stroke in her mother. Social History:   reports that she has never smoked. She has never used smokeless tobacco. She reports that she does not drink alcohol and does not use drugs.  Current Medications, Allergies, Past Medical History, Past Surgical History, Family History and Social History were reviewed in [employer].  Physical Exam: BP 120/80   Pulse 84   Ht _0  (1.549 m)   Wt 170 lb (77.1 kg)   SpO2 99%   BMI 32.12 kg/m  General:   Pleasant, well developed female in no acute distress Heart : Regular rate and rhythm; no murmurs Pulm: Clear anteriorly; no wheezing Abdomen: Soft, obese AB, normal skin exam, active bowel sounds, tenderness RUQ, without murphy's sign. No hepatomegaly appreciated.  Rectal: Not evaluated Extremities:  without  edema. Neurologic:  Alert and  oriented x4;  No focal deficits.  Psych:  Cooperative. Normal mood and affect.   AmaVladimir CroftsA-C 08/01/22

## 2022-07-30 ENCOUNTER — Encounter: Payer: Self-pay | Admitting: Family Medicine

## 2022-08-01 ENCOUNTER — Encounter: Payer: Self-pay | Admitting: Physician Assistant

## 2022-08-01 ENCOUNTER — Ambulatory Visit: Payer: PPO | Admitting: Physician Assistant

## 2022-08-01 VITALS — BP 120/80 | HR 84 | Ht 61.0 in | Wt 170.0 lb

## 2022-08-01 DIAGNOSIS — R1013 Epigastric pain: Secondary | ICD-10-CM

## 2022-08-01 DIAGNOSIS — Z86010 Personal history of colonic polyps: Secondary | ICD-10-CM

## 2022-08-01 DIAGNOSIS — R112 Nausea with vomiting, unspecified: Secondary | ICD-10-CM

## 2022-08-01 MED ORDER — TIRZEPATIDE 10 MG/0.5ML ~~LOC~~ SOAJ: 10.0000 mg | SUBCUTANEOUS | 0 refills | Status: DC

## 2022-08-01 NOTE — Patient Instructions (Signed)
If you are age 67 or older, your body mass index should be between 23-30. Your Body mass index is 32.12 kg/m. If this is out of the aforementioned range listed, please consider follow up with your Primary Care Provider. ________________________________________________________  The [facility] providers would like to encourage you to use [facility] to communicate with providers for non-urgent requests or questions.  Due to long hold times on the telephone, sending your provider a message by [facility] may be a faster and more efficient way to get a response.  Please allow 48 business hours for a response.  Please remember that this is for non-urgent requests.  ________________________________________  Dennis Bast have been scheduled for a HIDA scan at [facility] Radiology (1st floor) on 08-23-22. Please arrive 30 minutes prior to your scheduled appointment at 6:71IW. Make certain not to have anything to eat or drink at least 6 hours prior to your test. Should this appointment date or time not work well for you, please call radiology scheduling at [phone].  _____________________________________________________________________ Hepatobiliary (HIDA) scan is an imaging procedure used to diagnose problems in the liver, gallbladder and bile ducts. In the HIDA scan, a radioactive chemical or tracer is injected into a vein in your arm. The tracer is handled by the liver like bile. Bile is a fluid produced and excreted by your liver that helps your digestive system break down fats in the foods you eat. Bile is stored in your gallbladder and the gallbladder releases the bile when you eat a meal. A special nuclear medicine scanner (gamma camera) tracks the flow of the tracer from your liver into your gallbladder and small intestine.   During your HIDA scan  You'll be asked to change into a hospital gown before your HIDA scan begins. Your health care team will position you on a table, usually on your back. The radioactive  tracer is then injected into a vein in your arm.The tracer travels through your bloodstream to your liver, where it's taken up by the bile-producing cells. The radioactive tracer travels with the bile from your liver into your gallbladder and through your bile ducts to your small intestine.You may feel some pressure while the radioactive tracer is injected into your vein. As you lie on the table, a special gamma camera is positioned over your abdomen taking pictures of the tracer as it moves through your body. The gamma camera takes pictures continually for about an hour. You'll need to keep still during the HIDA scan. This can become uncomfortable, but you may find that you can lessen the discomfort by taking deep breaths and thinking about other things. Tell your health care team if you're uncomfortable. The radiologist will watch on a computer the progress of the radioactive tracer through your body. The HIDA scan may be stopped when the radioactive tracer is seen in the gallbladder and enters your small intestine. This typically takes about an hour. In some cases extra imaging will be performed if original images aren't satisfactory, if morphine is given to help visualize the gallbladder or if the medication CCK is given to look at the contraction of the gallbladder. This test typically takes 2 hours to complete. ________________________________________________________________________  Due to recent changes in healthcare laws, you may see the results of your imaging and laboratory studies on MyChart before your provider has had a chance to review them.  We understand that in some cases there may be results that are confusing or concerning to you. Not all laboratory results come back  in the same time frame and the provider may be waiting for multiple results in order to interpret others.  Please give [place] 48 hours in order for your provider to thoroughly review all the results before contacting the office for  clarification of your results.   Gastroparesis- medication you take can cause this- read over it Please do small frequent meals like 4-6 meals a day.  Eat and drink liquids at separate times.  Avoid high fiber foods, cook your vegetables, avoid high fat food.  Suggest spreading protein throughout the day (greek yogurt, glucerna, soft meat, milk, eggs) Choose soft foods that you can mash with a fork When you are more symptomatic, change to pureed foods foods and liquids.  Consider reading "Living well with Gastroparesis" by Lambert Keto Gastroparesis is a condition in which food takes longer than normal to empty from the stomach. This condition is also known as delayed gastric emptying. It is usually a long-term (chronic) condition. There is no cure, but there are treatments and things that you can do at home to help relieve symptoms. Treating the underlying condition that causes gastroparesis can also help relieve symptoms What are the causes? In many cases, the cause of this condition is not known. Possible causes include: A hormone (endocrine) disorder, such as hypothyroidism or diabetes. A nervous system disease, such as Parkinson's disease or multiple sclerosis. Cancer, infection, or surgery that affects the stomach or vagus nerve. The vagus nerve runs from your chest, through your neck, and to the lower part of your brain. A connective tissue disorder, such as scleroderma. Certain medicines. What increases the risk? You are more likely to develop this condition if: You have certain disorders or diseases. These may include: An endocrine disorder. An eating disorder. Amyloidosis. Scleroderma. Parkinson's disease. Multiple sclerosis. Cancer or infection of the stomach or the vagus nerve. You have had surgery on your stomach or vagus nerve. You take certain medicines. You are female. What are the signs or symptoms? Symptoms of this condition include: Feeling full after  eating very little or a loss of appetite. Nausea, vomiting, or heartburn. Bloating of your abdomen. Inconsistent blood sugar (glucose) levels on blood tests. Unexplained weight loss. Acid from the stomach coming up into the esophagus (gastroesophageal reflux). Sudden tightening (spasm) of the stomach, which can be painful. Symptoms may come and go. Some people may not notice any symptoms. How is this diagnosed? This condition is diagnosed with tests, such as: Tests that check how long it takes food to move through the stomach and intestines. These tests include: Upper gastrointestinal (GI) series. For this test, you drink a liquid that shows up well on X-rays, and then X-rays are taken of your intestines. Gastric emptying scintigraphy. For this test, you eat food that contains a small amount of radioactive material, and then scans are taken. Wireless capsule GI monitoring system. For this test, you swallow a pill (capsule) that records information about how foods and fluid move through your stomach. Gastric manometry. For this test, a tube is passed down your throat and into your stomach to measure electrical and muscular activity. Endoscopy. For this test, a long, thin tube with a camera and light on the end is passed down your throat and into your stomach to check for problems in your stomach lining. Ultrasound. This test uses sound waves to create images of the inside of your body. This can help rule out gallbladder disease or pancreatitis as a cause of your symptoms. How is this  treated? There is no cure for this condition, but treatment and home care may relieve symptoms. Treatment may include: Treating the underlying cause. Managing your symptoms by making changes to your diet and exercise habits. Taking medicines to control nausea and vomiting and to stimulate stomach muscles. Getting food through a feeding tube in the hospital. This may be done in severe cases. Having surgery to insert  a device called a gastric electrical stimulator into your body. This device helps improve stomach emptying and control nausea and vomiting. Follow these instructions at home: Take over-the-counter and prescription medicines only as told by your health care provider. Follow instructions from your health care provider about eating or drinking restrictions. Your health care provider may recommend that you: Eat smaller meals more often. Eat low-fat foods. Eat low-fiber forms of high-fiber foods. For example, eat cooked vegetables instead of raw vegetables. Have only liquid foods instead of solid foods. Liquid foods are easier to digest. Drink enough fluid to keep your urine pale yellow. Exercise as often as told by your health care provider. Keep all follow-up visits. This is important. Contact a health care provider if you: Notice that your symptoms do not improve with treatment. Have new symptoms. Get help right away if you: Have severe pain in your abdomen that does not improve with treatment. Have nausea that is severe or does not go away. Vomit every time you drink fluids. Summary Gastroparesis is a long-term (chronic) condition in which food takes longer than normal to empty from the stomach. Symptoms include nausea, vomiting, heartburn, bloating of your abdomen, and loss of appetite. Eating smaller portions, low-fat foods, and low-fiber forms of high-fiber foods may help you manage your symptoms. Get help right away if you have severe pain in your abdomen. This information is not intended to replace advice given to you by your health care provider. Make sure you discuss any questions you have with your health care provider. Document Revised: 02/17/2020 Document Reviewed: 02/17/2020 Elsevier Patient Education  2021 [place].  Thank you for entrusting me with your care and choosing [facility].  Vicie Mutters, PA-C

## 2022-08-23 ENCOUNTER — Other Ambulatory Visit (HOSPITAL_COMMUNITY): Payer: PPO

## 2022-08-30 ENCOUNTER — Encounter: Payer: Self-pay | Admitting: Family Medicine

## 2022-08-31 MED ORDER — TIRZEPATIDE 10 MG/0.5ML ~~LOC~~ SOAJ: 10.0000 mg | SUBCUTANEOUS | 0 refills | Status: DC

## 2022-09-06 ENCOUNTER — Encounter (HOSPITAL_COMMUNITY)
Admission: RE | Admit: 2022-09-06 | Discharge: 2022-09-06 | Disposition: A | Discharge status: Home / Self Care | Payer: PPO | Source: Ambulatory Visit | Attending: Physician Assistant | Admitting: Physician Assistant

## 2022-09-06 DIAGNOSIS — R112 Nausea with vomiting, unspecified: Secondary | ICD-10-CM | POA: Diagnosis not present

## 2022-09-06 DIAGNOSIS — R1011 Right upper quadrant pain: Secondary | ICD-10-CM | POA: Diagnosis not present

## 2022-09-06 MED ORDER — TECHNETIUM TC 99M MEBROFENIN IV KIT
5.1000 | PACK | Freq: Once | INTRAVENOUS | Status: AC | PRN
  Administered 2022-09-06: 5.1 via INTRAVENOUS

## 2022-09-23 ENCOUNTER — Encounter: Payer: Self-pay | Admitting: Family Medicine

## 2022-09-26 NOTE — Telephone Encounter (Signed)
Are we increasing the dose?

## 2022-09-27 ENCOUNTER — Encounter: Payer: Self-pay | Admitting: Family Medicine

## 2022-09-27 MED ORDER — TIRZEPATIDE 12.5 MG/0.5ML ~~LOC~~ SOAJ: 12.5000 mg | SUBCUTANEOUS | 0 refills | Status: DC

## 2022-09-28 NOTE — Telephone Encounter (Signed)
Dr Nani Ravens,  Please see pt's request above and advise in PCP's absence?  FYI we are currently out of rapid flu tests in the office. I am ordering more today but they might not be in for 3 days or more if they are on back order.

## 2022-10-10 ENCOUNTER — Telehealth: Payer: Self-pay

## 2022-10-10 NOTE — Telephone Encounter (Signed)
-----   Message from Stevan Born, [place] sent at 08/01/2022 10:35 AM EDT ----- Regarding: January 2024 3 month follow up, AC, dyspepsia, N & V, hx of aden poylp

## 2022-10-10 NOTE — Telephone Encounter (Signed)
Patient has been scheduled to see Vicie Mutters on 11/10/22 at 11:00.

## 2022-10-10 NOTE — Telephone Encounter (Signed)
Called and left message for patient to call back to be scheduled for F/U appointment with Vicie Mutters in January.

## 2022-10-22 ENCOUNTER — Encounter: Payer: Self-pay | Admitting: Family Medicine

## 2022-10-22 DIAGNOSIS — E785 Hyperlipidemia, unspecified: Secondary | ICD-10-CM

## 2022-10-25 ENCOUNTER — Encounter: Payer: Self-pay | Admitting: Family Medicine

## 2022-10-25 DIAGNOSIS — Z01419 Encounter for gynecological examination (general) (routine) without abnormal findings: Secondary | ICD-10-CM | POA: Diagnosis not present

## 2022-10-25 DIAGNOSIS — M810 Age-related osteoporosis without current pathological fracture: Secondary | ICD-10-CM | POA: Diagnosis not present

## 2022-10-25 DIAGNOSIS — N952 Postmenopausal atrophic vaginitis: Secondary | ICD-10-CM | POA: Diagnosis not present

## 2022-10-25 MED ORDER — SIMVASTATIN 20 MG PO TABS: 20.0000 mg | ORAL_TABLET | Freq: Every day | ORAL | 1 refills | Status: DC

## 2022-10-31 MED ORDER — TIRZEPATIDE 15 MG/0.5ML ~~LOC~~ SOAJ: 15.0000 mg | SUBCUTANEOUS | 0 refills | Status: DC

## 2022-11-09 NOTE — Progress Notes (Signed)
11/10/2022 Miranda Gonzales [id number] Sep 16, 1956  Referring provider: Carollee Herter, Alferd Apa, * Primary GI doctor: Dr. Fuller Plan  ASSESSMENT AND PLAN:   Dyspepsia with nausea and vomiting intermittent  Unremarkable RUQ [place], HIDA negative Was not on mounjaro when symptoms started, continue medications, continue gastroparesis diet, have resolved at this time If any worsening symptoms please contact us for possible EGD or stopping of mounjaro  Elevated LFTs Unremarkable RUQ [place] 04/2022 Continue weight loss Monitor q 6 months  History of adenomatous polyp of colon 08/15/2019 colonoscopy for family history of colon cancer personal history of adenomatous polyps with Dr. Fuller Plan good bowel prep eternal hemorrhoids otherwise normal recall 5 years  Recall 07/2024   History of Present Illness:  67 y.o. female  with a past medical history of hypertension, hyperlipidemia, anxiety, GERD and others listed below, returns to clinic today after evaluation 04/25/2022 for dyspepsia and elevated liver function.  Review of GI history 08/15/2019 colonoscopy for family history of colon cancer personal history of adenomatous polyps with Dr. Fuller Plan good bowel prep eternal hemorrhoids otherwise normal recall 5 years (07/2024) 11/16/2021 ALT 43, AST 28, alk phos 83, very mild elevation of ALT intermittently since 2021 06/07/2021 no anemia, no leukocytosis, normal platelets, pylori antibody negative lipase normal 05/05/2022 RUQ US unremarkable labs no anemia no leukocytosis normal liver function negative H. pylori blood. 09/06/22 HIDA EF 58%  She started to take [facility] August 10th, she continues to have weight loss but is planning on maintaining.  She has some nausea with this but denies AB pain or vomiting.  She has not any further diarrhea, had one day of loose stools after the beach but states it was from what she ate. No further bloating.  In between she will have normal BM, soft brown stools, no straining.   No melena no hematochezia. Denies NSAIDS.  No ETOH, no smoking.  Her mother had GB removal.  Wt Readings from Last 3 Encounters:  11/10/22 149 lb (67.6 kg)  08/01/22 170 lb (77.1 kg)  05/19/22 186 lb 9.6 oz (84.6 kg)     Current Medications:   Current Outpatient Medications (Endocrine & Metabolic):    tirzepatide (MOUNJARO) 15 MG/0.5ML Pen, Inject 15 mg into the skin once a week.  Current Outpatient Medications (Cardiovascular):    simvastatin (ZOCOR) 20 MG tablet, Take 1 tablet (20 mg total) by mouth at bedtime.     Current Outpatient Medications (Other):    Acetylcarnitine HCl (ACETYL L-CARNITINE PO), Take 1 tablet by mouth daily.   Alpha-Lipoic Acid 300 MG CAPS, Take by mouth.   b complex vitamins tablet, Take 1 tablet by mouth daily. B-50   CINNAMON PO, Take 6,000 mg by mouth. [place] '6000mg'$  once daily   escitalopram (LEXAPRO) 10 MG tablet, Take 1 tablet (10 mg total) by mouth in the morning.   Lysine 500 MG TABS, Take by mouth.   Nutritional Supplements (DHEA PO), Take by mouth daily.   OVER THE COUNTER MEDICATION, Iron, one tablet daily.   OVER THE COUNTER MEDICATION, Tumeric, one capsule daily.   OVER THE COUNTER MEDICATION, Eye vitamin, doesn't know the name.   OVER THE COUNTER MEDICATION, Magnesium, two tablets daily.   OVER THE COUNTER MEDICATION, Calcium, 4 tablets daily.   OVER THE COUNTER MEDICATION, 100 mg. DIM '100mg'$  once daily   OVER THE COUNTER MEDICATION, Sulforaphone '400mg'$  once daily   pantoprazole (PROTONIX) 40 MG tablet, TAKE 1 TABLET(40 MG) BY MOUTH DAILY  Surgical History:  She  has  a past surgical history that includes Colonoscopy w/ polypectomy (04/2009); Tubal ligation; Colonoscopy with propofol (N/A, 08/12/2014); and Uterine polyps. Family History:  Her family history includes Breast cancer in her maternal aunt; Cancer in her mother; Colon polyps in her father; Diabetes in her mother; Heart attack in her father; Hyperlipidemia in her sister;  Hypertension in her mother and sister; Rectal cancer in her mother; Stroke in her mother. Social History:   reports that she has never smoked. She has never used smokeless tobacco. She reports that she does not drink alcohol and does not use drugs.  Current Medications, Allergies, Past Medical History, Past Surgical History, Family History and Social History were reviewed in [employer] record.  Physical Exam: BP 114/66   Pulse (!) 57   Ht 5' 1.5" (1.562 m)   Wt 149 lb (67.6 kg)   BMI 27.70 kg/m  General:   Pleasant, well developed female in no acute distress Heart : Regular rate and rhythm; no murmurs Pulm: Clear anteriorly; no wheezing Abdomen: Soft, obese AB, normal skin exam, active bowel sounds, tenderness RUQ, without murphy's sign. No hepatomegaly appreciated.  Rectal: Not evaluated Extremities:  without  edema. Neurologic:  Alert and  oriented x4;  No focal deficits.  Psych:  Cooperative. Normal mood and affect.   Miranda Crofts, PA-C 11/10/22

## 2022-11-10 ENCOUNTER — Encounter: Payer: Self-pay | Admitting: Physician Assistant

## 2022-11-10 ENCOUNTER — Ambulatory Visit: Payer: PPO | Admitting: Physician Assistant

## 2022-11-10 VITALS — BP 114/66 | HR 57 | Ht 61.5 in | Wt 149.0 lb

## 2022-11-10 DIAGNOSIS — R112 Nausea with vomiting, unspecified: Secondary | ICD-10-CM | POA: Diagnosis not present

## 2022-11-10 DIAGNOSIS — Z86010 Personal history of colonic polyps: Secondary | ICD-10-CM

## 2022-11-10 DIAGNOSIS — R7989 Other specified abnormal findings of blood chemistry: Secondary | ICD-10-CM

## 2022-11-10 DIAGNOSIS — R1013 Epigastric pain: Secondary | ICD-10-CM

## 2022-11-10 NOTE — Patient Instructions (Signed)
Gastroparesis Please do small frequent meals like 4-6 meals a day.  Eat and drink liquids at separate times.  Avoid high fiber foods, cook your vegetables, avoid high fat food.  Suggest spreading protein throughout the day (greek yogurt, glucerna, soft meat, milk, eggs) Choose soft foods that you can mash with a fork When you are more symptomatic, change to pureed foods foods and liquids.  Consider reading "Living well with Gastroparesis" by Lambert Keto Gastroparesis is a condition in which food takes longer than normal to empty from the stomach. This condition is also known as delayed gastric emptying. It is usually a long-term (chronic) condition. There is no cure, but there are treatments and things that you can do at home to help relieve symptoms. Treating the underlying condition that causes gastroparesis can also help relieve symptoms What are the causes? In many cases, the cause of this condition is not known. Possible causes include: A hormone (endocrine) disorder, such as hypothyroidism or diabetes. A nervous system disease, such as Parkinson's disease or multiple sclerosis. Cancer, infection, or surgery that affects the stomach or vagus nerve. The vagus nerve runs from your chest, through your neck, and to the lower part of your brain. A connective tissue disorder, such as scleroderma. Certain medicines. What increases the risk? You are more likely to develop this condition if: You have certain disorders or diseases. These may include: An endocrine disorder. An eating disorder. Amyloidosis. Scleroderma. Parkinson's disease. Multiple sclerosis. Cancer or infection of the stomach or the vagus nerve. You have had surgery on your stomach or vagus nerve. You take certain medicines. You are female. What are the signs or symptoms? Symptoms of this condition include: Feeling full after eating very little or a loss of appetite. Nausea, vomiting, or heartburn. Bloating of  your abdomen. Inconsistent blood sugar (glucose) levels on blood tests. Unexplained weight loss. Acid from the stomach coming up into the esophagus (gastroesophageal reflux). Sudden tightening (spasm) of the stomach, which can be painful. Symptoms may come and go. Some people may not notice any symptoms. How is this diagnosed? This condition is diagnosed with tests, such as: Tests that check how long it takes food to move through the stomach and intestines. These tests include: Upper gastrointestinal (GI) series. For this test, you drink a liquid that shows up well on X-rays, and then X-rays are taken of your intestines. Gastric emptying scintigraphy. For this test, you eat food that contains a small amount of radioactive material, and then scans are taken. Wireless capsule GI monitoring system. For this test, you swallow a pill (capsule) that records information about how foods and fluid move through your stomach. Gastric manometry. For this test, a tube is passed down your throat and into your stomach to measure electrical and muscular activity. Endoscopy. For this test, a long, thin tube with a camera and light on the end is passed down your throat and into your stomach to check for problems in your stomach lining. Ultrasound. This test uses sound waves to create images of the inside of your body. This can help rule out gallbladder disease or pancreatitis as a cause of your symptoms. How is this treated? There is no cure for this condition, but treatment and home care may relieve symptoms. Treatment may include: Treating the underlying cause. Managing your symptoms by making changes to your diet and exercise habits. Taking medicines to control nausea and vomiting and to stimulate stomach muscles. Getting food through a feeding tube in the hospital. This  may be done in severe cases. Having surgery to insert a device called a gastric electrical stimulator into your body. This device helps  improve stomach emptying and control nausea and vomiting. Follow these instructions at home: Take over-the-counter and prescription medicines only as told by your health care provider. Follow instructions from your health care provider about eating or drinking restrictions. Your health care provider may recommend that you: Eat smaller meals more often. Eat low-fat foods. Eat low-fiber forms of high-fiber foods. For example, eat cooked vegetables instead of raw vegetables. Have only liquid foods instead of solid foods. Liquid foods are easier to digest. Drink enough fluid to keep your urine pale yellow. Exercise as often as told by your health care provider. Keep all follow-up visits. This is important. Contact a health care provider if you: Notice that your symptoms do not improve with treatment. Have new symptoms. Get help right away if you: Have severe pain in your abdomen that does not improve with treatment. Have nausea that is severe or does not go away. Vomit every time you drink fluids. Summary Gastroparesis is a long-term (chronic) condition in which food takes longer than normal to empty from the stomach. Symptoms include nausea, vomiting, heartburn, bloating of your abdomen, and loss of appetite. Eating smaller portions, low-fat foods, and low-fiber forms of high-fiber foods may help you manage your symptoms. Get help right away if you have severe pain in your abdomen. This information is not intended to replace advice given to you by your health care provider. Make sure you discuss any questions you have with your health care provider. Document Revised: 02/17/2020 Document Reviewed: 02/17/2020 Elsevier Patient Education  2021 [employer].

## 2022-11-11 ENCOUNTER — Encounter: Payer: Self-pay | Admitting: Family Medicine

## 2022-11-11 MED ORDER — ESCITALOPRAM OXALATE 10 MG PO TABS: 10.0000 mg | ORAL_TABLET | Freq: Every morning | ORAL | 0 refills | Status: DC

## 2022-11-18 ENCOUNTER — Encounter: Payer: Self-pay | Admitting: Family Medicine

## 2022-11-18 ENCOUNTER — Ambulatory Visit (INDEPENDENT_AMBULATORY_CARE_PROVIDER_SITE_OTHER): Payer: PPO | Admitting: Family Medicine

## 2022-11-18 VITALS — BP 110/70 | HR 82 | Temp 97.7°F | Resp 18 | Ht 61.5 in | Wt 146.8 lb

## 2022-11-18 DIAGNOSIS — E559 Vitamin D deficiency, unspecified: Secondary | ICD-10-CM | POA: Diagnosis not present

## 2022-11-18 DIAGNOSIS — E1169 Type 2 diabetes mellitus with other specified complication: Secondary | ICD-10-CM | POA: Diagnosis not present

## 2022-11-18 DIAGNOSIS — I10 Essential (primary) hypertension: Secondary | ICD-10-CM | POA: Diagnosis not present

## 2022-11-18 DIAGNOSIS — E1165 Type 2 diabetes mellitus with hyperglycemia: Secondary | ICD-10-CM

## 2022-11-18 DIAGNOSIS — E785 Hyperlipidemia, unspecified: Secondary | ICD-10-CM | POA: Diagnosis not present

## 2022-11-18 DIAGNOSIS — E782 Mixed hyperlipidemia: Secondary | ICD-10-CM | POA: Diagnosis not present

## 2022-11-18 LAB — COMPREHENSIVE METABOLIC PANEL
ALT: 29 U/L (ref 0–35)
AST: 22 U/L (ref 0–37)
Albumin: 4.2 g/dL (ref 3.5–5.2)
Alkaline Phosphatase: 66 U/L (ref 39–117)
BUN: 17 mg/dL (ref 6–23)
CO2: 30 mEq/L (ref 19–32)
Calcium: 9.8 mg/dL (ref 8.4–10.5)
Chloride: 105 mEq/L (ref 96–112)
Creatinine, Ser: 0.73 mg/dL (ref 0.40–1.20)
GFR: 84.84 mL/min (ref >60.00)
Glucose, Bld: 80 mg/dL (ref 70–99)
Potassium: 4.3 mEq/L (ref 3.5–5.1)
Sodium: 142 mEq/L (ref 135–145)
Total Bilirubin: 0.4 mg/dL (ref 0.2–1.2)
Total Protein: 6.8 g/dL (ref 6.0–8.3)

## 2022-11-18 LAB — LIPID PANEL
Cholesterol: 111 mg/dL (ref 0–200)
HDL: 37.9 mg/dL — ABNORMAL LOW (ref >39.00)
LDL Cholesterol: 56 mg/dL (ref 0–99)
NonHDL: 72.67
Total CHOL/HDL Ratio: 3
Triglycerides: 84 mg/dL (ref 0.0–149.0)
VLDL: 16.8 mg/dL (ref 0.0–40.0)

## 2022-11-18 LAB — MICROALBUMIN / CREATININE URINE RATIO
Creatinine,U: 232.7 mg/dL
Microalb Creat Ratio: 0.8 mg/g (ref 0.0–30.0)
Microalb, Ur: 1.8 mg/dL (ref 0.0–1.9)

## 2022-11-18 LAB — VITAMIN D 25 HYDROXY (VIT D DEFICIENCY, FRACTURES): VITD: 36.77 ng/mL (ref 30.00–100.00)

## 2022-11-18 MED ORDER — TIRZEPATIDE 15 MG/0.5ML ~~LOC~~ SOAJ: 15.0000 mg | SUBCUTANEOUS | 3 refills | Status: DC

## 2022-11-18 NOTE — Assessment & Plan Note (Signed)
Encourage heart healthy diet such as MIND or DASH diet, increase exercise, avoid trans fats, simple carbohydrates and processed foods, consider a krill or fish or flaxseed oil cap daily.  °

## 2022-11-18 NOTE — Assessment & Plan Note (Signed)
Well controlled, no changes to meds. Encouraged heart healthy diet such as the DASH diet and exercise as tolerated.  °

## 2022-11-18 NOTE — Progress Notes (Signed)
Established Patient Office Visit  Subjective   Patient ID: Miranda Gonzales, female    DOB: Nov 20, 1955  Age: 67 y.o. MRN: [id number]  Chief Complaint  Patient presents with   Hypertension   Hyperlipidemia   Diabetes   Follow-up    HPI  HYPERTENSION  Blood pressure range-not checking   Chest pain- no      Dyspnea- no Lightheadedness- no   Edema- no Other side effects - no   Medication compliance: good  Low salt diet- yes   DIABETES  Blood Sugar ranges-not checking   Polyuria- no New Visual problems- no Hypoglycemic symptoms- no Other side effects-no Medication compliance - good  Last eye exam-  has app in March 2024 Foot exam- today  HYPERLIPIDEMIA  Medication compliance- good  RUQ pain- no  Muscle aches- no Other side effects-no    Patient Active Problem List   Diagnosis Date Noted   Preventative health care 05/19/2022   History of adenomatous polyp of colon 04/25/2022   Abdominal pain, epigastric 06/07/2021   Hyperlipidemia associated with type 2 diabetes mellitus ([place]) 04/29/2021   Diet-controlled diabetes mellitus ([place]) 04/16/2020   Anxiety    Nonspecific elevation of levels of transaminase or lactic acid dehydrogenase (LDH) 02/28/2014   Osteopenia 02/27/2014   Hyperlipidemia 01/03/2011   Vitamin D deficiency 09/29/2010   Anxiety state 09/29/2010   Essential hypertension 09/29/2010   GERD 09/29/2010   Past Medical History:  Diagnosis Date   Anxiety    GERD (gastroesophageal reflux disease)    Hyperlipidemia    Hypertension    Osteoporosis    Past Surgical History:  Procedure Laterality Date   COLONOSCOPY W/ POLYPECTOMY  04/2009   due 2015   COLONOSCOPY WITH PROPOFOL N/A 08/12/2014   Procedure: COLONOSCOPY WITH PROPOFOL;  Surgeon: Garlan Fair, MD;  Location: [facility];  Service: Endoscopy;  Laterality: N/A;   TUBAL LIGATION     Uterine polyps     Social History   Tobacco Use   Smoking status: Never   Smokeless tobacco: Never   Vaping Use   Vaping Use: Never used  Substance Use Topics   Alcohol use: No   Drug use: No   Social History   Socioeconomic History   Marital status: Married    Spouse name: Not on file   Number of children: Not on file   Years of education: Not on file   Highest education level: Not on file  Occupational History   Occupation: retired Engineer, water  Tobacco Use   Smoking status: Never   Smokeless tobacco: Never  Vaping Use   Vaping Use: Never used  Substance and Sexual Activity   Alcohol use: No   Drug use: No   Sexual activity: Yes    Partners: Male  Other Topics Concern   Not on file  Social History Narrative   Not on file   Social Determinants of Health   Financial Resource Strain: Low Risk  (03/08/2022)   Overall Financial Resource Strain (CARDIA)    Difficulty of Paying Living Expenses: Not hard at all  Food Insecurity: No Food Insecurity (03/08/2022)   Hunger Vital Sign    Worried About Running Out of Food in the Last Year: Never true    [place] in the Last Year: Never true  Transportation Needs: No Transportation Needs (03/08/2022)   PRAPARE - Hydrologist (Medical): No    Lack of Transportation (Non-Medical): No  Physical Activity: Sufficiently  Active (03/08/2022)   Exercise Vital Sign    Days of Exercise per Week: 3 days    Minutes of Exercise per Session: 90 min  Stress: No Stress Concern Present (03/08/2022)   [place]    Feeling of Stress : Not at all  Social Connections: Moderately Integrated (03/08/2022)   Social Connection and Isolation Panel [NHANES]    Frequency of Communication with Friends and Family: Twice a week    Frequency of Social Gatherings with Friends and Family: More than three times a week    Attends Religious Services: More than 4 times per year    Active Member of [employer] or Organizations: No    Attends Archivist Meetings: Never     Marital Status: Married  Human resources officer Violence: Not At Risk (03/08/2022)   Humiliation, Afraid, Rape, and Kick questionnaire    Fear of Current or Ex-Partner: No    Emotionally Abused: No    Physically Abused: No    Sexually Abused: No   Family Status  Relation Name Status   Mother  Deceased   Father  (Not Specified)   Sister  (Not Specified)   Sister  (Not Specified)   Mat Aunt  (Not Specified)   Neg Hx  (Not Specified)   Family History  Problem Relation Age of Onset   Cancer Mother        rectal   Diabetes Mother    Hypertension Mother    Stroke Mother        in 42s   Rectal cancer Mother    Colon polyps Father    Heart attack Father        37 & 59   Hyperlipidemia Sister    Hypertension Sister    Breast cancer Maternal Aunt    Colon cancer Neg Hx    Esophageal cancer Neg Hx    Ulcerative colitis Neg Hx    Allergies  Allergen Reactions   Penicillins Itching and Rash   Bacitracin Hives      Review of Systems  Constitutional:  Negative for fever and malaise/fatigue.  HENT:  Negative for congestion.   Eyes:  Negative for blurred vision.  Respiratory:  Negative for shortness of breath.   Cardiovascular:  Negative for chest pain, palpitations and leg swelling.  Gastrointestinal:  Negative for abdominal pain, blood in stool and nausea.  Genitourinary:  Negative for dysuria and frequency.  Musculoskeletal:  Negative for falls.  Skin:  Negative for rash.  Neurological:  Negative for dizziness, loss of consciousness and headaches.  Endo/Heme/Allergies:  Negative for environmental allergies.  Psychiatric/Behavioral:  Negative for depression. The patient is not nervous/anxious.       Objective:     BP 110/70 (BP Location: Left Arm, Patient Position: Sitting, Cuff Size: Normal)   Pulse 82   Temp 97.7 F (36.5 C) (Oral)   Resp 18   Ht 5' 1.5" (1.562 m)   Wt 146 lb 12.8 oz (66.6 kg)   SpO2 96%   BMI 27.29 kg/m  BP Readings from Last 3 Encounters:   11/18/22 110/70  11/10/22 114/66  08/01/22 120/80   Wt Readings from Last 3 Encounters:  11/18/22 146 lb 12.8 oz (66.6 kg)  11/10/22 149 lb (67.6 kg)  08/01/22 170 lb (77.1 kg)   SpO2 Readings from Last 3 Encounters:  11/18/22 96%  08/01/22 99%  05/19/22 96%      Physical Exam Vitals and nursing note reviewed.  Constitutional:      Appearance: She is well-developed.  HENT:     Head: Normocephalic and atraumatic.  Eyes:     Conjunctiva/sclera: Conjunctivae normal.  Neck:     Thyroid: No thyromegaly.     Vascular: No carotid bruit or JVD.  Cardiovascular:     Rate and Rhythm: Normal rate and regular rhythm.     Heart sounds: Normal heart sounds. No murmur heard. Pulmonary:     Effort: Pulmonary effort is normal. No respiratory distress.     Breath sounds: Normal breath sounds. No wheezing or rales.  Chest:     Chest wall: No tenderness.  Musculoskeletal:     Cervical back: Normal range of motion and neck supple.  Neurological:     Mental Status: She is alert and oriented to person, place, and time.    BP 110/70 (BP Location: Left Arm, Patient Position: Sitting, Cuff Size: Normal)   Pulse 82   Temp 97.7 F (36.5 C) (Oral)   Resp 18   Ht 5' 1.5" (1.562 m)   Wt 146 lb 12.8 oz (66.6 kg)   SpO2 96%   BMI 27.29 kg/m  General appearance: alert, cooperative, appears stated age, and no distress Eyes: conjunctivae/corneas clear. PERRL, EOM's intact. Fundi benign. Throat: lips, mucosa, and tongue normal; teeth and gums normal Neck: no adenopathy, no carotid bruit, no JVD, supple, symmetrical, trachea midline, and thyroid not enlarged, symmetric, no tenderness/mass/nodules Lungs: clear to auscultation bilaterally Heart: regular rate and rhythm, S1, S2 normal, no murmur, click, rub or gallop Abdomen: soft, non-tender; bowel sounds normal; no masses,  no organomegaly Extremities: extremities normal, atraumatic, no cyanosis or edema Lymph nodes: Cervical, supraclavicular,  and axillary nodes normal. Neurologic: Alert and oriented X 3, normal strength and tone. Normal symmetric reflexes. Normal coordination and gait   No results found for any visits on 11/18/22.  Last CBC Lab Results  Component Value Date   WBC 8.1 05/19/2022   HGB 13.0 05/19/2022   HCT 40.8 05/19/2022   MCV 84.0 05/19/2022   MCH 29.0 10/05/2020   RDW 15.7 (H) 05/19/2022   PLT 299.0 67/89/3810   Last metabolic panel Lab Results  Component Value Date   GLUCOSE 108 (H) 05/19/2022   NA 143 05/19/2022   K 4.8 05/19/2022   CL 105 05/19/2022   CO2 32 05/19/2022   BUN 17 05/19/2022   CREATININE 0.89 05/19/2022   GFRNONAA >60 10/05/2020   CALCIUM 9.4 05/19/2022   PROT 6.4 05/19/2022   ALBUMIN 4.0 05/19/2022   BILITOT 0.4 05/19/2022   ALKPHOS 84 05/19/2022   AST 21 05/19/2022   ALT 33 05/19/2022   ANIONGAP 5 10/05/2020   Last lipids Lab Results  Component Value Date   CHOL 136 05/19/2022   HDL 47.60 05/19/2022   LDLCALC 76 05/19/2022   TRIG 65.0 05/19/2022   CHOLHDL 3 05/19/2022   Last hemoglobin A1c Lab Results  Component Value Date   HGBA1C 6.7 (H) 05/19/2022   Last thyroid functions Lab Results  Component Value Date   TSH 2.05 03/25/2015   Last vitamin D Lab Results  Component Value Date   VD25OH 26.67 (L) 05/19/2022   Last vitamin B12 and Folate No results found for: "VITAMINB12", "FOLATE"    The 10-year ASCVD risk score (Arnett DK, et al., 2019) is: 11.5%    Assessment & Plan:   Problem List Items Addressed This Visit       Unprioritized   Vitamin D deficiency   Relevant  Orders   VITAMIN D 25 Hydroxy (Vit-D Deficiency, Fractures)   Hyperlipidemia associated with type 2 diabetes mellitus (HCC)    Encourage heart healthy diet such as MIND or DASH diet, increase exercise, avoid trans fats, simple carbohydrates and processed foods, consider a krill or fish or flaxseed oil cap daily.        Relevant Medications   tirzepatide (MOUNJARO) 15 MG/0.5ML  Pen   Hyperlipidemia   Relevant Orders   Comprehensive metabolic panel   Lipid panel   Essential hypertension - Primary    Well controlled, no changes to meds. Encouraged heart healthy diet such as the DASH diet and exercise as tolerated.        Relevant Orders   Comprehensive metabolic panel   Lipid panel   Other Visit Diagnoses     Type 2 diabetes mellitus with hyperglycemia, without long-term current use of insulin (HCC)       Relevant Medications   tirzepatide (MOUNJARO) 15 MG/0.5ML Pen   Other Relevant Orders   Comprehensive metabolic panel   Lipid panel   Hemoglobin A1c   Microalbumin / creatinine urine ratio       No follow-ups on file.    Ann Held, DO

## 2022-11-21 LAB — HEMOGLOBIN A1C: Hgb A1c MFr Bld: 5.7 % (ref 4.6–6.5)

## 2023-01-17 ENCOUNTER — Other Ambulatory Visit: Payer: Self-pay | Admitting: Family Medicine

## 2023-01-17 DIAGNOSIS — R1013 Epigastric pain: Secondary | ICD-10-CM

## 2023-02-11 ENCOUNTER — Other Ambulatory Visit: Payer: Self-pay | Admitting: Family Medicine

## 2023-02-14 ENCOUNTER — Other Ambulatory Visit: Payer: Self-pay | Admitting: *Deleted

## 2023-02-14 MED ORDER — ESCITALOPRAM OXALATE 10 MG PO TABS: 10.0000 mg | ORAL_TABLET | Freq: Every day | ORAL | 1 refills | Status: DC

## 2023-02-17 ENCOUNTER — Telehealth: Payer: Self-pay | Admitting: Family Medicine

## 2023-02-17 NOTE — Telephone Encounter (Signed)
Copied from CRM [id number]. Topic: Medicare AWV >> Feb 17, 2023 11:04 AM Payton Doughty wrote: Reason for CRM: Called patient to schedule Medicare Annual Wellness Visit (AWV). Left message for patient to call back and schedule Medicare Annual Wellness Visit (AWV).  Last date of AWV: 03/08/22  Please schedule an appointment at any time with Donne Anon, CMA  .  If any questions, please contact me.  Thank you ,  Verlee Rossetti; Care Guide Ambulatory Clinical Support [place] [facility] Direct Dial: [phone]

## 2023-02-20 ENCOUNTER — Telehealth: Payer: Self-pay | Admitting: Family Medicine

## 2023-02-20 NOTE — Telephone Encounter (Signed)
Contacted Miranda Gonzales to schedule their annual wellness visit. Appointment made for 03/10/2023.  Miranda Gonzales; Care Guide Ambulatory Clinical Support [place] [facility] Direct Dial: [phone]

## 2023-02-24 DIAGNOSIS — D225 Melanocytic nevi of trunk: Secondary | ICD-10-CM | POA: Diagnosis not present

## 2023-02-24 DIAGNOSIS — X32XXXD Exposure to sunlight, subsequent encounter: Secondary | ICD-10-CM | POA: Diagnosis not present

## 2023-02-24 DIAGNOSIS — L570 Actinic keratosis: Secondary | ICD-10-CM | POA: Diagnosis not present

## 2023-03-02 DIAGNOSIS — H2513 Age-related nuclear cataract, bilateral: Secondary | ICD-10-CM | POA: Diagnosis not present

## 2023-03-02 DIAGNOSIS — E119 Type 2 diabetes mellitus without complications: Secondary | ICD-10-CM | POA: Diagnosis not present

## 2023-03-02 DIAGNOSIS — H04123 Dry eye syndrome of bilateral lacrimal glands: Secondary | ICD-10-CM | POA: Diagnosis not present

## 2023-03-02 DIAGNOSIS — H5213 Myopia, bilateral: Secondary | ICD-10-CM | POA: Diagnosis not present

## 2023-03-02 LAB — HM DIABETES EYE EXAM

## 2023-03-10 ENCOUNTER — Ambulatory Visit (INDEPENDENT_AMBULATORY_CARE_PROVIDER_SITE_OTHER): Payer: PPO | Admitting: *Deleted

## 2023-03-10 DIAGNOSIS — Z0000 Encounter for general adult medical examination without abnormal findings: Secondary | ICD-10-CM | POA: Diagnosis not present

## 2023-03-10 NOTE — Progress Notes (Signed)
Subjective:  Pt completed ADLs, Fall risk, and SDOH during e-check in on 03/03/23.  Answers verified with pt.    Miranda Gonzales is a 67 y.o. female who presents for Medicare Annual (Subsequent) preventive examination.  I connected with  Miranda Gonzales on 03/10/23 by a audio enabled telemedicine application and verified that I am speaking with the correct person using two identifiers.  Patient Location: Home  Provider Location: Office/Clinic  I discussed the limitations of evaluation and management by telemedicine. The patient expressed understanding and agreed to proceed.   Review of Systems     Cardiac Risk Factors include: advanced age (>51men, >36 women);diabetes mellitus;hypertension     Objective:    There were no vitals filed for this visit. There is no height or weight on file to calculate BMI.     03/10/2023    3:40 PM 03/08/2022    1:03 PM 11/02/2020   10:17 AM 10/05/2020    1:08 PM 08/12/2014   11:09 AM 07/28/2014    2:26 PM  Advanced Directives  Does Patient Have a Medical Advance Directive? No No No No No No  Would patient like information on creating a medical advance directive? No - Patient declined No - Patient declined Yes (MAU/Ambulatory/Procedural Areas - Information given) No - Patient declined No - patient declined information No - patient declined information    Current Medications (verified) Outpatient Encounter Medications as of 03/10/2023  Medication Sig   escitalopram (LEXAPRO) 10 MG tablet Take 1 tablet (10 mg total) by mouth daily.   Lysine 500 MG TABS Take by mouth.   Nutritional Supplements (DHEA PO) Take by mouth daily.   OVER THE COUNTER MEDICATION Iron, one tablet daily.   OVER THE COUNTER MEDICATION Tumeric, one capsule daily.   OVER THE COUNTER MEDICATION Eye vitamin, doesn't know the name.   OVER THE COUNTER MEDICATION Magnesium, two tablets daily.   OVER THE COUNTER MEDICATION Calcium, 4 tablets daily.   OVER THE COUNTER MEDICATION  100 mg. DIM 100mg  once daily   OVER THE COUNTER MEDICATION Sulforaphone 400mg  once daily   pantoprazole (PROTONIX) 40 MG tablet TAKE 1 TABLET(40 MG) BY MOUTH DAILY   simvastatin (ZOCOR) 20 MG tablet Take 1 tablet (20 mg total) by mouth at bedtime.   tirzepatide (MOUNJARO) 15 MG/0.5ML Pen Inject 15 mg into the skin once a week.   No facility-administered encounter medications on file as of 03/10/2023.    Allergies (verified) Penicillins and Bacitracin   History: Past Medical History:  Diagnosis Date   Allergy    Anxiety    Diabetes mellitus without complication (HCC)    GERD (gastroesophageal reflux disease)    Hyperlipidemia    Hypertension    Osteoporosis    Past Surgical History:  Procedure Laterality Date   COLONOSCOPY W/ POLYPECTOMY  04/2009   due 2015   COLONOSCOPY WITH PROPOFOL N/A 08/12/2014   Procedure: COLONOSCOPY WITH PROPOFOL;  Surgeon: Charolett Bumpers, MD;  Location: [facility];  Service: Endoscopy;  Laterality: N/A;   TUBAL LIGATION     Uterine polyps     Family History  Problem Relation Age of Onset   Cancer Mother        rectal   Diabetes Mother    Hypertension Mother    Stroke Mother        in 93s   Rectal cancer Mother    Colon polyps Father    Heart attack Father  55 & 65   Hyperlipidemia Sister    Hypertension Sister    Hyperlipidemia Sister    Breast cancer Maternal Aunt    Colon cancer Neg Hx    Esophageal cancer Neg Hx    Ulcerative colitis Neg Hx    Social History   Socioeconomic History   Marital status: Married    Spouse name: Not on file   Number of children: Not on file   Years of education: Not on file   Highest education level: Not on file  Occupational History   Occupation: retired Chief Operating Officer  Tobacco Use   Smoking status: Never   Smokeless tobacco: Never  Vaping Use   Vaping Use: Never used  Substance and Sexual Activity   Alcohol use: No   Drug use: No   Sexual activity: Not Currently    Partners: Male    Birth  control/protection: Post-menopausal  Other Topics Concern   Not on file  Social History Narrative   Not on file   Social Determinants of Health   Financial Resource Strain: Low Risk  (03/08/2022)   Overall Financial Resource Strain (CARDIA)    Difficulty of Paying Living Expenses: Not hard at all  Food Insecurity: No Food Insecurity (03/03/2023)   Hunger Vital Sign    Worried About Running Out of Food in the Last Year: Never true    Ran Out of Food in the Last Year: Never true  Transportation Needs: No Transportation Needs (03/03/2023)   PRAPARE - Administrator, Civil Service (Medical): No    Lack of Transportation (Non-Medical): No  Physical Activity: Insufficiently Active (03/03/2023)   Exercise Vital Sign    Days of Exercise per Week: 2 days    Minutes of Exercise per Session: 60 min  Stress: No Stress Concern Present (03/03/2023)   [employer] of Occupational Health - Occupational Stress Questionnaire    Feeling of Stress : Not at all  Social Connections: Moderately Integrated (03/08/2022)   Social Connection and Isolation Panel [NHANES]    Frequency of Communication with Friends and Family: Twice a week    Frequency of Social Gatherings with Friends and Family: More than three times a week    Attends Religious Services: More than 4 times per year    Active Member of [employer] or Organizations: No    Attends Engineer, structural: Never    Marital Status: Married    Tobacco Counseling Counseling given: Not Answered   Clinical Intake:  Pre-visit preparation completed: Yes  Pain : No/denies pain  Nutritional Risks: None Diabetes: Yes CBG done?: No Did pt. bring in CBG monitor from home?: No  How often do you need to have someone help you when you read instructions, pamphlets, or other written materials from your doctor or pharmacy?: 1 - Never   Activities of Daily Living    03/03/2023   12:43 PM  In your present state of health, do you have  any difficulty performing the following activities:  Hearing? 0  Vision? 0  Difficulty concentrating or making decisions? 0  Walking or climbing stairs? 0  Dressing or bathing? 0  Doing errands, shopping? 0  Preparing Food and eating ? N  Using the Toilet? N  In the past six months, have you accidently leaked urine? Y  Do you have problems with loss of bowel control? N  Managing your Medications? N  Managing your Finances? N  Housekeeping or managing your Housekeeping? N    Patient Care  Team: Zola Button, Grayling Congress, DO as PCP - General (Family Medicine) Harold Hedge, MD as Consulting Physician (Obstetrics and Gynecology) Antony Contras, MD as Consulting Physician (Ophthalmology)  Indicate any recent Medical Services you may have received from other than [facility] providers in the past year (date may be approximate).     Assessment:   This is a routine wellness examination for Aleighya.  Hearing/Vision screen No results found.  Dietary issues and exercise activities discussed: Current Exercise Habits: The patient does not participate in regular exercise at present, Exercise limited by: None identified   Goals Addressed   None    Depression Screen    03/10/2023    3:41 PM 11/18/2022   10:11 AM 03/08/2022    1:02 PM 04/29/2021   11:09 AM 11/02/2020   10:11 AM 04/16/2020    8:56 AM 01/23/2018   10:13 AM  PHQ 2/9 Scores  PHQ - 2 Score 0 0 0 0 0 0 0  PHQ- 9 Score     0      Fall Risk    03/03/2023   12:43 PM 11/18/2022   10:11 AM 03/08/2022    1:04 PM 04/29/2021   11:09 AM 11/02/2020   10:10 AM  Fall Risk   Falls in the past year? 0 0 0 0 0  Number falls in past yr: 0 0 0 0 0  Injury with Fall? 0 0 0 0 0  Risk for fall due to : No Fall Risks  Impaired vision    Follow up Falls evaluation completed Falls evaluation completed Falls prevention discussed Falls evaluation completed Falls evaluation completed    FALL RISK PREVENTION PERTAINING TO THE HOME:  Any stairs in or around  the home? Yes  If so, are there any without handrails? Yes  Home free of loose throw rugs in walkways, pet beds, electrical cords, etc? Yes  Adequate lighting in your home to reduce risk of falls? Yes   ASSISTIVE DEVICES UTILIZED TO PREVENT FALLS:  Life alert? No  Use of a cane, walker or w/c? No  Grab bars in the bathroom? Yes  Shower chair or bench in shower? Yes  Elevated toilet seat or a handicapped toilet?  Comfort height  TIMED UP AND GO:  Was the test performed?  No, audio visit .    Cognitive Function:    11/02/2020   10:13 AM  MMSE - Mini Mental State Exam  Orientation to time 5  Orientation to Place 5  Registration 3  Attention/ Calculation 5  Recall 3  Language- name 2 objects 2  Language- repeat 1  Language- follow 3 step command 3  Language- read & follow direction 1  Write a sentence 1  Copy design 1  Total score 30        03/10/2023    3:49 PM 03/08/2022    1:06 PM  6CIT Screen  What Year? 0 points 0 points  What month? 0 points 0 points  What time? 0 points 0 points  Count back from 20 0 points 0 points  Months in reverse 0 points 0 points  Repeat phrase 0 points 0 points  Total Score 0 points 0 points    Immunizations Immunization History  Administered Date(s) Administered   Influenza,inj,Quad PF,6+ Mos 08/29/2018, 07/02/2019   Influenza-Unspecified 08/29/2020, 09/08/2021, 09/08/2022   PFIZER(Purple Top)SARS-COV-2 Vaccination 01/16/2020, 02/11/2020, 08/29/2020   PNEUMOCOCCAL CONJUGATE-20 11/16/2021   Pfizer Covid-19 Vaccine Bivalent Booster 58yrs & up 11/16/2021   Pneumococcal Conjugate-13 03/30/2015  Pneumococcal Polysaccharide-23 12/23/2016, 11/02/2020   Tdap 12/23/2016   Unspecified SARS-COV-2 Vaccination 01/16/2020, 02/11/2020   Zoster Recombinat (Shingrix) 12/05/2017, 02/12/2018   Zoster, Live 03/30/2015    TDAP status: Up to date  Flu Vaccine status: Up to date  Pneumococcal vaccine status: Up to date  Covid-19 vaccine  status: Information provided on how to obtain vaccines.   Qualifies for Shingles Vaccine? Yes   Zostavax completed Yes   Shingrix Completed?: Yes  Screening Tests Health Maintenance  Topic Date Due   COVID-19 Vaccine (7 - 2023-24 season) 06/24/2022   Medicare Annual Wellness (AWV)  03/09/2023   OPHTHALMOLOGY EXAM  02/18/2023   MAMMOGRAM  04/26/2023   HEMOGLOBIN A1C  05/19/2023   INFLUENZA VACCINE  05/25/2023   Diabetic kidney evaluation - eGFR measurement  11/19/2023   Diabetic kidney evaluation - Urine ACR  11/19/2023   FOOT EXAM  11/19/2023   COLONOSCOPY (Pts 45-47yrs Insurance coverage will need to be confirmed)  08/14/2024   DTaP/Tdap/Td (2 - Td or Tdap) 12/24/2026   Pneumonia Vaccine 86+ Years old  Completed   DEXA SCAN  Completed   Hepatitis C Screening  Completed   Zoster Vaccines- Shingrix  Completed   HPV VACCINES  Aged Out    Health Maintenance  Health Maintenance Due  Topic Date Due   COVID-19 Vaccine (7 - 2023-24 season) 06/24/2022   Medicare Annual Wellness (AWV)  03/09/2023   OPHTHALMOLOGY EXAM  02/18/2023    Colorectal cancer screening: Type of screening: Colonoscopy. Completed 08/15/19. Repeat every 5 years  Mammogram status: Completed 04/25/22. Repeat every year  Bone Density status: Completed 2022 per pt.  Completed at GYN office. Results reflect: Bone density results: OSTEOPOROSIS. Repeat every 2 years.  Lung Cancer Screening: (Low Dose CT Chest recommended if Age 9-80 years, 30 pack-year currently smoking OR have quit w/in 15years.) does not qualify.   Additional Screening:  Hepatitis C Screening: does qualify; Completed 01/14/16  Vision Screening: Recommended annual ophthalmology exams for early detection of glaucoma and other disorders of the eye. Is the patient up to date with their annual eye exam?  Yes  Who is the provider or what is the name of the office in which the patient attends annual eye exams? [facility]  If  pt is not established with a provider, would they like to be referred to a provider to establish care? No .   Dental Screening: Recommended annual dental exams for proper oral hygiene  Community Resource Referral / Chronic Care Management: CRR required this visit?  No   CCM required this visit?  No      Plan:     I have personally reviewed and noted the following in the patient's chart:   Medical and social history Use of alcohol, tobacco or illicit drugs  Current medications and supplements including opioid prescriptions. Patient is not currently taking opioid prescriptions. Functional ability and status Nutritional status Physical activity Advanced directives List of other physicians Hospitalizations, surgeries, and ER visits in previous 12 months Vitals Screenings to include cognitive, depression, and falls Referrals and appointments  In addition, I have reviewed and discussed with patient certain preventive protocols, quality metrics, and best practice recommendations. A written personalized care plan for preventive services as well as general preventive health recommendations were provided to patient.   Due to this being a telephonic visit, the after visit summary with patients personalized plan was offered to patient via mail or my-chart.  Patient would like to access on  my-chart.  Donne Anon, [place]   03/10/2023   Nurse Notes: None

## 2023-03-16 ENCOUNTER — Other Ambulatory Visit: Payer: Self-pay | Admitting: Obstetrics and Gynecology

## 2023-03-16 DIAGNOSIS — Z1231 Encounter for screening mammogram for malignant neoplasm of breast: Secondary | ICD-10-CM

## 2023-03-21 NOTE — Patient Instructions (Signed)
[name] , Thank you for taking time to come for your Medicare Wellness Visit. I appreciate your ongoing commitment to your health goals. Please review the following plan we discussed and let me know if I can assist you in the future.      This is a list of the screening recommended for you and due dates:  Health Maintenance  Topic Date Due   COVID-19 Vaccine (7 - 2023-24 season) 06/24/2022   Eye exam for diabetics  02/18/2023   Mammogram  04/26/2023   Hemoglobin A1C  05/19/2023   Flu Shot  05/25/2023   Yearly kidney function blood test for diabetes  11/19/2023   Yearly kidney health urinalysis for diabetes  11/19/2023   Complete foot exam   11/19/2023   Medicare Annual Wellness Visit  03/09/2024   Colon Cancer Screening  08/14/2024   DTaP/Tdap/Td vaccine (2 - Td or Tdap) 12/24/2026   Pneumonia Vaccine  Completed   DEXA scan (bone density measurement)  Completed   Hepatitis C Screening  Completed   Zoster (Shingles) Vaccine  Completed   HPV Vaccine  Aged Out     Next appointment: Follow up in one year for your annual wellness visit    Preventive Care 68 Years and Older, Female Preventive care refers to lifestyle choices and visits with your health care provider that can promote health and wellness. What does preventive care include? A yearly physical exam. This is also called an annual well check. Dental exams once or twice a year. Routine eye exams. Ask your health care provider how often you should have your eyes checked. Personal lifestyle choices, including: Daily care of your teeth and gums. Regular physical activity. Eating a healthy diet. Avoiding tobacco and drug use. Limiting alcohol use. Practicing safe sex. Taking low-dose aspirin every day. Taking vitamin and mineral supplements as recommended by your health care provider. What happens during an annual well check? The services and screenings done by your health care provider during your annual well check  will depend on your age, overall health, lifestyle risk factors, and family history of disease. Counseling  Your health care provider may ask you questions about your: Alcohol use. Tobacco use. Drug use. Emotional well-being. Home and relationship well-being. Sexual activity. Eating habits. History of falls. Memory and ability to understand (cognition). Work and work Astronomer. Reproductive health. Screening  You may have the following tests or measurements: Height, weight, and BMI. Blood pressure. Lipid and cholesterol levels. These may be checked every 5 years, or more frequently if you are over 68 years old. Skin check. Lung cancer screening. You may have this screening every year starting at age 68 if you have a 30-pack-year history of smoking and currently smoke or have quit within the past 15 years. Fecal occult blood test (FOBT) of the stool. You may have this test every year starting at age 68. Flexible sigmoidoscopy or colonoscopy. You may have a sigmoidoscopy every 5 years or a colonoscopy every 10 years starting at age 68. Hepatitis C blood test. Hepatitis B blood test. Sexually transmitted disease (STD) testing. Diabetes screening. This is done by checking your blood sugar (glucose) after you have not eaten for a while (fasting). You may have this done every 1-3 years. Bone density scan. This is done to screen for osteoporosis. You may have this done starting at age 32. Mammogram. This may be done every 1-2 years. Talk to your health care provider about how often you should have regular mammograms. Talk  with your health care provider about your test results, treatment options, and if necessary, the need for more tests. Vaccines  Your health care provider may recommend certain vaccines, such as: Influenza vaccine. This is recommended every year. Tetanus, diphtheria, and acellular pertussis (Tdap, Td) vaccine. You may need a Td booster every 10 years. Zoster vaccine. You  may need this after age 68. Pneumococcal 13-valent conjugate (PCV13) vaccine. One dose is recommended after age 6. Pneumococcal polysaccharide (PPSV23) vaccine. One dose is recommended after age 47. Talk to your health care provider about which screenings and vaccines you need and how often you need them. This information is not intended to replace advice given to you by your health care provider. Make sure you discuss any questions you have with your health care provider. Document Released: 11/06/2015 Document Revised: 06/29/2016 Document Reviewed: 08/11/2015 Elsevier Interactive Patient Education  2017 [employer].  Fall Prevention in the Home Falls can cause injuries. They can happen to people of all ages. There are many things you can do to make your home safe and to help prevent falls. What can I do on the outside of my home? Regularly fix the edges of walkways and driveways and fix any cracks. Remove anything that might make you trip as you walk through a door, such as a raised step or threshold. Trim any bushes or trees on the path to your home. Use bright outdoor lighting. Clear any walking paths of anything that might make someone trip, such as rocks or tools. Regularly check to see if handrails are loose or broken. Make sure that both sides of any steps have handrails. Any raised decks and porches should have guardrails on the edges. Have any leaves, snow, or ice cleared regularly. Use sand or salt on walking paths during winter. Clean up any spills in your garage right away. This includes oil or grease spills. What can I do in the bathroom? Use night lights. Install grab bars by the toilet and in the tub and shower. Do not use towel bars as grab bars. Use non-skid mats or decals in the tub or shower. If you need to sit down in the shower, use a plastic, non-slip stool. Keep the floor dry. Clean up any water that spills on the floor as soon as it happens. Remove soap buildup  in the tub or shower regularly. Attach bath mats securely with double-sided non-slip rug tape. Do not have throw rugs and other things on the floor that can make you trip. What can I do in the bedroom? Use night lights. Make sure that you have a light by your bed that is easy to reach. Do not use any sheets or blankets that are too big for your bed. They should not hang down onto the floor. Have a firm chair that has side arms. You can use this for support while you get dressed. Do not have throw rugs and other things on the floor that can make you trip. What can I do in the kitchen? Clean up any spills right away. Avoid walking on wet floors. Keep items that you use a lot in easy-to-reach places. If you need to reach something above you, use a strong step stool that has a grab bar. Keep electrical cords out of the way. Do not use floor polish or wax that makes floors slippery. If you must use wax, use non-skid floor wax. Do not have throw rugs and other things on the floor that can make you  trip. What can I do with my stairs? Do not leave any items on the stairs. Make sure that there are handrails on both sides of the stairs and use them. Fix handrails that are broken or loose. Make sure that handrails are as long as the stairways. Check any carpeting to make sure that it is firmly attached to the stairs. Fix any carpet that is loose or worn. Avoid having throw rugs at the top or bottom of the stairs. If you do have throw rugs, attach them to the floor with carpet tape. Make sure that you have a light switch at the top of the stairs and the bottom of the stairs. If you do not have them, ask someone to add them for you. What else can I do to help prevent falls? Wear shoes that: Do not have high heels. Have rubber bottoms. Are comfortable and fit you well. Are closed at the toe. Do not wear sandals. If you use a stepladder: Make sure that it is fully opened. Do not climb a closed  stepladder. Make sure that both sides of the stepladder are locked into place. Ask someone to hold it for you, if possible. Clearly mark and make sure that you can see: Any grab bars or handrails. First and last steps. Where the edge of each step is. Use tools that help you move around (mobility aids) if they are needed. These include: Canes. Walkers. Scooters. Crutches. Turn on the lights when you go into a dark area. Replace any light bulbs as soon as they burn out. Set up your furniture so you have a clear path. Avoid moving your furniture around. If any of your floors are uneven, fix them. If there are any pets around you, be aware of where they are. Review your medicines with your doctor. Some medicines can make you feel dizzy. This can increase your chance of falling. Ask your doctor what other things that you can do to help prevent falls. This information is not intended to replace advice given to you by your health care provider. Make sure you discuss any questions you have with your health care provider. Document Released: 08/06/2009 Document Revised: 03/17/2016 Document Reviewed: 11/14/2014 Elsevier Interactive Patient Education  2017 [employer].

## 2023-04-17 ENCOUNTER — Other Ambulatory Visit: Payer: Self-pay | Admitting: Family Medicine

## 2023-04-17 DIAGNOSIS — E785 Hyperlipidemia, unspecified: Secondary | ICD-10-CM

## 2023-04-21 ENCOUNTER — Encounter: Payer: Self-pay | Admitting: Family Medicine

## 2023-04-21 ENCOUNTER — Ambulatory Visit (INDEPENDENT_AMBULATORY_CARE_PROVIDER_SITE_OTHER): Payer: PPO | Admitting: Family Medicine

## 2023-04-21 VITALS — BP 100/70 | HR 74 | Temp 97.6°F | Resp 16 | Ht 61.5 in | Wt 138.0 lb

## 2023-04-21 DIAGNOSIS — E1165 Type 2 diabetes mellitus with hyperglycemia: Secondary | ICD-10-CM | POA: Diagnosis not present

## 2023-04-21 DIAGNOSIS — E119 Type 2 diabetes mellitus without complications: Secondary | ICD-10-CM

## 2023-04-21 DIAGNOSIS — I10 Essential (primary) hypertension: Secondary | ICD-10-CM

## 2023-04-21 DIAGNOSIS — Z7985 Long-term (current) use of injectable non-insulin antidiabetic drugs: Secondary | ICD-10-CM

## 2023-04-21 DIAGNOSIS — E782 Mixed hyperlipidemia: Secondary | ICD-10-CM

## 2023-04-21 LAB — LIPID PANEL
Cholesterol: 108 mg/dL (ref 0–200)
HDL: 37.8 mg/dL — ABNORMAL LOW (ref >39.00)
LDL Cholesterol: 59 mg/dL (ref 0–99)
NonHDL: 70.13
Total CHOL/HDL Ratio: 3
Triglycerides: 54 mg/dL (ref 0.0–149.0)
VLDL: 10.8 mg/dL (ref 0.0–40.0)

## 2023-04-21 LAB — COMPREHENSIVE METABOLIC PANEL
ALT: 41 U/L — ABNORMAL HIGH (ref 0–35)
AST: 26 U/L (ref 0–37)
Albumin: 4 g/dL (ref 3.5–5.2)
Alkaline Phosphatase: 86 U/L (ref 39–117)
BUN: 22 mg/dL (ref 6–23)
CO2: 31 mEq/L (ref 19–32)
Calcium: 9.8 mg/dL (ref 8.4–10.5)
Chloride: 106 mEq/L (ref 96–112)
Creatinine, Ser: 0.67 mg/dL (ref 0.40–1.20)
GFR: 89.9 mL/min (ref >60.00)
Glucose, Bld: 80 mg/dL (ref 70–99)
Potassium: 4.9 mEq/L (ref 3.5–5.1)
Sodium: 142 mEq/L (ref 135–145)
Total Bilirubin: 0.5 mg/dL (ref 0.2–1.2)
Total Protein: 6.4 g/dL (ref 6.0–8.3)

## 2023-04-21 LAB — MICROALBUMIN / CREATININE URINE RATIO
Creatinine,U: 138.6 mg/dL
Microalb Creat Ratio: 0.5 mg/g (ref 0.0–30.0)
Microalb, Ur: <0.7 mg/dL (ref 0.0–1.9)

## 2023-04-21 LAB — CBC WITH DIFFERENTIAL/PLATELET
Basophils Absolute: 0 10*3/uL (ref 0.0–0.1)
Basophils Relative: 0.4 % (ref 0.0–3.0)
Eosinophils Absolute: 0.2 10*3/uL (ref 0.0–0.7)
Eosinophils Relative: 2.5 % (ref 0.0–5.0)
HCT: 45.1 % (ref 36.0–46.0)
Hemoglobin: 14.4 g/dL (ref 12.0–15.0)
Lymphocytes Relative: 19.3 % (ref 12.0–46.0)
Lymphs Abs: 1.4 10*3/uL (ref 0.7–4.0)
MCHC: 32.1 g/dL (ref 30.0–36.0)
MCV: 89 fl (ref 78.0–100.0)
Monocytes Absolute: 0.7 10*3/uL (ref 0.1–1.0)
Monocytes Relative: 9.6 % (ref 3.0–12.0)
Neutro Abs: 4.8 10*3/uL (ref 1.4–7.7)
Neutrophils Relative %: 68.2 % (ref 43.0–77.0)
Platelets: 241 10*3/uL (ref 150.0–400.0)
RBC: 5.06 Mil/uL (ref 3.87–5.11)
RDW: 14.2 % (ref 11.5–15.5)
WBC: 7 10*3/uL (ref 4.0–10.5)

## 2023-04-21 LAB — HEMOGLOBIN A1C: Hgb A1c MFr Bld: 5.7 % (ref 4.6–6.5)

## 2023-04-21 NOTE — Assessment & Plan Note (Signed)
Well controlled, no changes to meds. Encouraged heart healthy diet such as the DASH diet and exercise as tolerated.  °

## 2023-04-21 NOTE — Assessment & Plan Note (Signed)
Tolerating statin, encouraged heart healthy diet, avoid trans fats, minimize simple carbs and saturated fats. Increase exercise as tolerated 

## 2023-04-21 NOTE — Progress Notes (Signed)
Established Patient Office Visit  Subjective   Patient ID: Miranda Gonzales, female    DOB: 1956/05/08  Age: 67 y.o. MRN: [id number]  Chief Complaint  Patient presents with   Diabetes   Hyperlipidemia   Follow-up    HPI Discussed the use of AI scribe software for clinical note transcription with the patient, who gave verbal consent to proceed.  History of Present Illness   The patient, with a history of weight loss and potential diabetes, presents for a follow-up visit. They report adherence to their current medication regimen, including Majuro, and have been maintaining their weight loss. They express a desire to avoid developing full-blown diabetes and are hopeful that their weight loss will aid in this. They have not been checking their blood sugars at home. They also report hair loss, which they attribute to their rapid weight loss, and are taking vitamins to help with this. They have not noticed any other significant changes in their health.  In addition, the patient discusses their wishes regarding a DNR order and a living will. They express a desire for CPR and IV fluids if indicated, but are unsure about long-term feeding tube placement. They are given information and forms to consider.      Patient Active Problem List   Diagnosis Date Noted   Preventative health care 05/19/2022   History of adenomatous polyp of colon 04/25/2022   Abdominal pain, epigastric 06/07/2021   Hyperlipidemia associated with type 2 diabetes mellitus (HCC) 04/29/2021   Diet-controlled diabetes mellitus (HCC) 04/16/2020   Anxiety    Nonspecific elevation of levels of transaminase or lactic acid dehydrogenase (LDH) 02/28/2014   Osteopenia 02/27/2014   Hyperlipidemia 01/03/2011   Vitamin D deficiency 09/29/2010   Anxiety state 09/29/2010   Essential hypertension 09/29/2010   GERD 09/29/2010   Past Medical History:  Diagnosis Date   Allergy    Anxiety    Diabetes mellitus without complication  (HCC)    GERD (gastroesophageal reflux disease)    Hyperlipidemia    Hypertension    Osteoporosis    Past Surgical History:  Procedure Laterality Date   COLONOSCOPY W/ POLYPECTOMY  04/2009   due 2015   COLONOSCOPY WITH PROPOFOL N/A 08/12/2014   Procedure: COLONOSCOPY WITH PROPOFOL;  Surgeon: Charolett Bumpers, MD;  Location: [facility];  Service: Endoscopy;  Laterality: N/A;   TUBAL LIGATION     Uterine polyps     Social History   Tobacco Use   Smoking status: Never   Smokeless tobacco: Never  Vaping Use   Vaping Use: Never used  Substance Use Topics   Alcohol use: No   Drug use: No   Social History   Socioeconomic History   Marital status: Married    Spouse name: Not on file   Number of children: Not on file   Years of education: Not on file   Highest education level: 12th grade  Occupational History   Occupation: retired Chief Operating Officer  Tobacco Use   Smoking status: Never   Smokeless tobacco: Never  Vaping Use   Vaping Use: Never used  Substance and Sexual Activity   Alcohol use: No   Drug use: No   Sexual activity: Not Currently    Partners: Male    Birth control/protection: Post-menopausal  Other Topics Concern   Not on file  Social History Narrative   Not on file   Social Determinants of Health   Financial Resource Strain: Low Risk  (04/14/2023)   Overall Financial Resource  Strain (CARDIA)    Difficulty of Paying Living Expenses: Not hard at all  Food Insecurity: No Food Insecurity (04/14/2023)   Hunger Vital Sign    Worried About Running Out of Food in the Last Year: Never true    Ran Out of Food in the Last Year: Never true  Transportation Needs: No Transportation Needs (04/14/2023)   PRAPARE - Administrator, Civil Service (Medical): No    Lack of Transportation (Non-Medical): No  Physical Activity: Insufficiently Active (04/14/2023)   Exercise Vital Sign    Days of Exercise per Week: 1 day    Minutes of Exercise per Session: 130 min  Stress:  No Stress Concern Present (04/14/2023)   [employer] of Occupational Health - Occupational Stress Questionnaire    Feeling of Stress : Not at all  Social Connections: Moderately Integrated (04/14/2023)   Social Connection and Isolation Panel [NHANES]    Frequency of Communication with Friends and Family: Once a week    Frequency of Social Gatherings with Friends and Family: Once a week    Attends Religious Services: More than 4 times per year    Active Member of [employer] or Organizations: Yes    Attends Engineer, structural: More than 4 times per year    Marital Status: Married  Catering manager Violence: Not At Risk (03/10/2023)   Humiliation, Afraid, Rape, and Kick questionnaire    Fear of Current or Ex-Partner: No    Emotionally Abused: No    Physically Abused: No    Sexually Abused: No   Family Status  Relation Name Status   Mother Mother Deceased   Father  (Not Specified)   Sister  (Not Specified)   Sister Arline Asp (Not Specified)   Mat Aunt  (Not Specified)   Neg Hx  (Not Specified)   Family History  Problem Relation Age of Onset   Cancer Mother        rectal   Diabetes Mother    Hypertension Mother    Stroke Mother        in 32s   Rectal cancer Mother    Colon polyps Father    Heart attack Father        40 & 44   Hyperlipidemia Sister    Hypertension Sister    Hyperlipidemia Sister    Breast cancer Maternal Aunt    Colon cancer Neg Hx    Esophageal cancer Neg Hx    Ulcerative colitis Neg Hx    Allergies  Allergen Reactions   Penicillins Itching and Rash   Bacitracin Hives      Review of Systems  Constitutional:  Negative for fever and malaise/fatigue.  HENT:  Negative for congestion.   Eyes:  Negative for blurred vision.  Respiratory:  Negative for cough and shortness of breath.   Cardiovascular:  Negative for chest pain, palpitations and leg swelling.  Gastrointestinal:  Negative for abdominal pain, blood in stool, nausea and vomiting.   Genitourinary:  Negative for dysuria and frequency.  Musculoskeletal:  Negative for back pain and falls.  Skin:  Negative for rash.  Neurological:  Negative for dizziness, loss of consciousness and headaches.  Endo/Heme/Allergies:  Negative for environmental allergies.  Psychiatric/Behavioral:  Negative for depression. The patient is not nervous/anxious.       Objective:     BP 100/70 (BP Location: Left Arm, Patient Position: Sitting, Cuff Size: Normal)   Pulse 74   Temp 97.6 F (36.4 C) (Oral)   Resp  16   Ht 5' 1.5" (1.562 m)   Wt 138 lb (62.6 kg)   SpO2 96%   BMI 25.65 kg/m  BP Readings from Last 3 Encounters:  04/21/23 100/70  11/18/22 110/70  11/10/22 114/66   Wt Readings from Last 3 Encounters:  04/21/23 138 lb (62.6 kg)  11/18/22 146 lb 12.8 oz (66.6 kg)  11/10/22 149 lb (67.6 kg)   SpO2 Readings from Last 3 Encounters:  04/21/23 96%  11/18/22 96%  08/01/22 99%      Physical Exam Vitals and nursing note reviewed.  Constitutional:      General: She is not in acute distress.    Appearance: Normal appearance. She is well-developed.  HENT:     Head: Normocephalic and atraumatic.  Eyes:     General: No scleral icterus.       Right eye: No discharge.        Left eye: No discharge.  Cardiovascular:     Rate and Rhythm: Normal rate and regular rhythm.     Heart sounds: No murmur heard. Pulmonary:     Effort: Pulmonary effort is normal. No respiratory distress.     Breath sounds: Normal breath sounds.  Musculoskeletal:        General: Normal range of motion.     Cervical back: Normal range of motion and neck supple.     Right lower leg: No edema.     Left lower leg: No edema.  Skin:    General: Skin is warm and dry.  Neurological:     General: No focal deficit present.     Mental Status: She is alert and oriented to person, place, and time.  Psychiatric:        Mood and Affect: Mood normal.        Behavior: Behavior normal.        Thought Content:  Thought content normal.        Judgment: Judgment normal.      No results found for any visits on 04/21/23.  Last CBC Lab Results  Component Value Date   WBC 8.1 05/19/2022   HGB 13.0 05/19/2022   HCT 40.8 05/19/2022   MCV 84.0 05/19/2022   MCH 29.0 10/05/2020   RDW 15.7 (H) 05/19/2022   PLT 299.0 05/19/2022   Last metabolic panel Lab Results  Component Value Date   GLUCOSE 80 11/18/2022   NA 142 11/18/2022   K 4.3 11/18/2022   CL 105 11/18/2022   CO2 30 11/18/2022   BUN 17 11/18/2022   CREATININE 0.73 11/18/2022   GFRNONAA >60 10/05/2020   CALCIUM 9.8 11/18/2022   PROT 6.8 11/18/2022   ALBUMIN 4.2 11/18/2022   BILITOT 0.4 11/18/2022   ALKPHOS 66 11/18/2022   AST 22 11/18/2022   ALT 29 11/18/2022   ANIONGAP 5 10/05/2020   Last lipids Lab Results  Component Value Date   CHOL 111 11/18/2022   HDL 37.90 (L) 11/18/2022   LDLCALC 56 11/18/2022   TRIG 84.0 11/18/2022   CHOLHDL 3 11/18/2022   Last hemoglobin A1c Lab Results  Component Value Date   HGBA1C 5.7 11/18/2022   Last thyroid functions Lab Results  Component Value Date   TSH 2.05 03/25/2015   Last vitamin D Lab Results  Component Value Date   VD25OH 36.77 11/18/2022   Last vitamin B12 and Folate No results found for: "VITAMINB12", "FOLATE"    The ASCVD Risk score (Arnett DK, et al., 2019) failed to calculate for the following  reasons:   The valid total cholesterol range is 130 to 320 mg/dL    Assessment & Plan:  Assessment and Plan    Weight Management: Successful weight loss achieved and maintained with the aid of Majuro. Patient is at goal weight and is no longer losing weight. -Continue Majuro 15mg  every other day to maintain current weight and prevent development of diabetes.  Pre-Diabetes: Patient is at risk but has not developed full-blown diabetes. Weight loss and Majuro use are preventive measures. -Continue current management plan.  Advance Care Planning: Discussion about MOST  (Medical Orders for Scope of Treatment) form and patient's wishes regarding DNR (Do Not Resuscitate) and long-term feeding tube. Patient initially checked both options but after discussion, expressed uncertainty. -Completed MOST form per patient's current wishes. Advised patient that this can be changed in the future if desired.  Hair Loss: Patient reports hair loss following rapid weight loss. -Reassured patient that this is a common occurrence and usually self-corrects. Encouraged continued use of vitamins and supplements to support hair health.        No follow-ups on file.    Donato Schultz, DO

## 2023-04-21 NOTE — Assessment & Plan Note (Signed)
Check labs  hgba1c to be checked, minimize simple carbs. Increase exercise as tolerated. Continue current meds  

## 2023-04-28 ENCOUNTER — Ambulatory Visit
Admission: RE | Admit: 2023-04-28 | Discharge: 2023-04-28 | Disposition: A | Discharge status: Home / Self Care | Payer: PPO | Source: Ambulatory Visit | Attending: Obstetrics and Gynecology | Admitting: Obstetrics and Gynecology

## 2023-04-28 DIAGNOSIS — Z1231 Encounter for screening mammogram for malignant neoplasm of breast: Secondary | ICD-10-CM

## 2023-04-29 ENCOUNTER — Encounter: Payer: Self-pay | Admitting: Family Medicine

## 2023-05-01 NOTE — Telephone Encounter (Signed)
Noted  

## 2023-06-27 DIAGNOSIS — X32XXXD Exposure to sunlight, subsequent encounter: Secondary | ICD-10-CM | POA: Diagnosis not present

## 2023-06-27 DIAGNOSIS — L570 Actinic keratosis: Secondary | ICD-10-CM | POA: Diagnosis not present

## 2023-07-22 ENCOUNTER — Other Ambulatory Visit: Payer: Self-pay | Admitting: Family Medicine

## 2023-07-22 DIAGNOSIS — E785 Hyperlipidemia, unspecified: Secondary | ICD-10-CM

## 2023-08-10 ENCOUNTER — Other Ambulatory Visit: Payer: Self-pay | Admitting: Family Medicine

## 2023-08-22 DIAGNOSIS — M816 Localized osteoporosis [Lequesne]: Secondary | ICD-10-CM | POA: Diagnosis not present

## 2023-08-22 DIAGNOSIS — N958 Other specified menopausal and perimenopausal disorders: Secondary | ICD-10-CM | POA: Diagnosis not present

## 2023-08-22 LAB — HM DEXA SCAN

## 2023-09-14 ENCOUNTER — Other Ambulatory Visit: Payer: Self-pay | Admitting: Family Medicine

## 2023-09-14 ENCOUNTER — Encounter: Payer: Self-pay | Admitting: Family Medicine

## 2023-09-14 MED ORDER — ESCITALOPRAM OXALATE 10 MG PO TABS: 10.0000 mg | ORAL_TABLET | Freq: Every day | ORAL | 0 refills | Status: DC

## 2023-10-02 ENCOUNTER — Ambulatory Visit: Payer: PPO | Admitting: Family Medicine

## 2023-10-03 ENCOUNTER — Encounter: Payer: Self-pay | Admitting: Family Medicine

## 2023-10-03 ENCOUNTER — Ambulatory Visit (INDEPENDENT_AMBULATORY_CARE_PROVIDER_SITE_OTHER): Payer: PPO | Admitting: Family Medicine

## 2023-10-03 VITALS — BP 120/80 | HR 74 | Temp 97.9°F | Resp 16 | Ht 61.5 in | Wt 137.4 lb

## 2023-10-03 DIAGNOSIS — E785 Hyperlipidemia, unspecified: Secondary | ICD-10-CM

## 2023-10-03 DIAGNOSIS — I10 Essential (primary) hypertension: Secondary | ICD-10-CM

## 2023-10-03 DIAGNOSIS — Z23 Encounter for immunization: Secondary | ICD-10-CM | POA: Diagnosis not present

## 2023-10-03 DIAGNOSIS — E1169 Type 2 diabetes mellitus with other specified complication: Secondary | ICD-10-CM

## 2023-10-03 DIAGNOSIS — E1165 Type 2 diabetes mellitus with hyperglycemia: Secondary | ICD-10-CM | POA: Diagnosis not present

## 2023-10-03 DIAGNOSIS — E119 Type 2 diabetes mellitus without complications: Secondary | ICD-10-CM | POA: Diagnosis not present

## 2023-10-03 LAB — LIPID PANEL
Cholesterol: 139 mg/dL (ref 0–200)
HDL: 45.7 mg/dL (ref >39.00)
LDL Cholesterol: 82 mg/dL (ref 0–99)
NonHDL: 93.63
Total CHOL/HDL Ratio: 3
Triglycerides: 60 mg/dL (ref 0.0–149.0)
VLDL: 12 mg/dL (ref 0.0–40.0)

## 2023-10-03 LAB — CBC WITH DIFFERENTIAL/PLATELET
Basophils Absolute: 0.1 10*3/uL (ref 0.0–0.1)
Basophils Relative: 0.8 % (ref 0.0–3.0)
Eosinophils Absolute: 0.2 10*3/uL (ref 0.0–0.7)
Eosinophils Relative: 2.9 % (ref 0.0–5.0)
HCT: 41.3 % (ref 36.0–46.0)
Hemoglobin: 13.3 g/dL (ref 12.0–15.0)
Lymphocytes Relative: 21.4 % (ref 12.0–46.0)
Lymphs Abs: 1.5 10*3/uL (ref 0.7–4.0)
MCHC: 32.1 g/dL (ref 30.0–36.0)
MCV: 89.8 fL (ref 78.0–100.0)
Monocytes Absolute: 0.6 10*3/uL (ref 0.1–1.0)
Monocytes Relative: 8.9 % (ref 3.0–12.0)
Neutro Abs: 4.8 10*3/uL (ref 1.4–7.7)
Neutrophils Relative %: 66 % (ref 43.0–77.0)
Platelets: 269 10*3/uL (ref 150.0–400.0)
RBC: 4.6 Mil/uL (ref 3.87–5.11)
RDW: 14 % (ref 11.5–15.5)
WBC: 7.2 10*3/uL (ref 4.0–10.5)

## 2023-10-03 LAB — COMPREHENSIVE METABOLIC PANEL
ALT: 48 U/L — ABNORMAL HIGH (ref 0–35)
AST: 30 U/L (ref 0–37)
Albumin: 4 g/dL (ref 3.5–5.2)
Alkaline Phosphatase: 89 U/L (ref 39–117)
BUN: 18 mg/dL (ref 6–23)
CO2: 30 meq/L (ref 19–32)
Calcium: 9.3 mg/dL (ref 8.4–10.5)
Chloride: 107 meq/L (ref 96–112)
Creatinine, Ser: 0.63 mg/dL (ref 0.40–1.20)
GFR: 90.95 mL/min (ref >60.00)
Glucose, Bld: 92 mg/dL (ref 70–99)
Potassium: 5.7 meq/L — ABNORMAL HIGH (ref 3.5–5.1)
Sodium: 142 meq/L (ref 135–145)
Total Bilirubin: 0.3 mg/dL (ref 0.2–1.2)
Total Protein: 6.2 g/dL (ref 6.0–8.3)

## 2023-10-03 LAB — MICROALBUMIN / CREATININE URINE RATIO
Creatinine,U: 90.6 mg/dL
Microalb Creat Ratio: 0.8 mg/g (ref 0.0–30.0)
Microalb, Ur: <0.7 mg/dL (ref 0.0–1.9)

## 2023-10-03 LAB — HEMOGLOBIN A1C: Hgb A1c MFr Bld: 5.7 % (ref 4.6–6.5)

## 2023-10-03 NOTE — Assessment & Plan Note (Signed)
Well controlled, no changes to meds. Encouraged heart healthy diet such as the DASH diet and exercise as tolerated.  °

## 2023-10-03 NOTE — Assessment & Plan Note (Signed)
Encourage heart healthy diet such as MIND or DASH diet, increase exercise, avoid trans fats, simple carbohydrates and processed foods, consider a krill or fish or flaxseed oil cap daily.  °

## 2023-10-03 NOTE — Assessment & Plan Note (Signed)
hgba1c to be checked, minimize simple carbs. Increase exercise as tolerated. Continue current meds  

## 2023-10-03 NOTE — Progress Notes (Signed)
Established Patient Office Visit  Subjective   Patient ID: Miranda Gonzales, female    DOB: [birth year]  Age: 67 y.o. MRN: [id number]  Chief Complaint  Patient presents with   Hyperlipidemia   Diabetes   Follow-up    HPI Discussed the use of AI scribe software for clinical note transcription with the patient, who gave verbal consent to proceed.  History of Present Illness   The patient, with a known history of osteoporosis, presents for a routine follow-up. She reports adherence to her current medication regimen, including Vitamin D and calcium supplements. She has recently undergone a bone density test, the results of which indicate a worsening of her osteoporosis. The patient has not yet sought consultation regarding these results but plans to do so. She also mentions a potential increase in physical activity, specifically weight-bearing exercises, as suggested by her spouse.  The patient also has a history of reflux, for which she takes pantoprazole as needed, although she has not needed to take this medication recently. She also takes simvastatin, escitalopram, and Magtrate, all of which she reports no need for refills at this time.  The patient has a history of diabetes, which is diet-controlled and managed with Mounjaro, primarily for weight management. She does not regularly check her blood sugar levels but has recently lost weight.  The patient has also recently received a flu shot and is up-to-date on her pneumonia and tetanus vaccinations. She has no other health concerns at this time.      Patient Active Problem List   Diagnosis Date Noted   Preventative health care 05/19/2022   History of adenomatous polyp of colon 04/25/2022   Abdominal pain, epigastric 06/07/2021   Hyperlipidemia associated with type 2 diabetes mellitus (HCC) 04/29/2021   Diet-controlled diabetes mellitus (HCC) 04/16/2020   Anxiety    Nonspecific elevation of levels of transaminase or lactic acid  dehydrogenase (LDH) 02/28/2014   Osteopenia 02/27/2014   Hyperlipidemia 01/03/2011   Vitamin D deficiency 09/29/2010   Anxiety state 09/29/2010   Essential hypertension 09/29/2010   GERD 09/29/2010   Past Medical History:  Diagnosis Date   Allergy    Anxiety    Diabetes mellitus without complication (HCC)    GERD (gastroesophageal reflux disease)    Hyperlipidemia    Hypertension    Osteoporosis    Past Surgical History:  Procedure Laterality Date   COLONOSCOPY W/ POLYPECTOMY  04/2009   due 2015   COLONOSCOPY WITH PROPOFOL N/A 08/12/2014   Procedure: COLONOSCOPY WITH PROPOFOL;  Surgeon: Charolett Bumpers, MD;  Location: [facility];  Service: Endoscopy;  Laterality: N/A;   TUBAL LIGATION     Uterine polyps     Social History   Tobacco Use   Smoking status: Never   Smokeless tobacco: Never  Vaping Use   Vaping status: Never Used  Substance Use Topics   Alcohol use: No   Drug use: No   Social History   Socioeconomic History   Marital status: Married    Spouse name: Not on file   Number of children: Not on file   Years of education: Not on file   Highest education level: 12th grade  Occupational History   Occupation: retired Chief Operating Officer  Tobacco Use   Smoking status: Never   Smokeless tobacco: Never  Vaping Use   Vaping status: Never Used  Substance and Sexual Activity   Alcohol use: No   Drug use: No   Sexual activity: Not Currently    Partners:  Male    Birth control/protection: Post-menopausal  Other Topics Concern   Not on file  Social History Narrative   Not on file   Social Determinants of Health   Financial Resource Strain: Low Risk  (09/26/2023)   Overall Financial Resource Strain (CARDIA)    Difficulty of Paying Living Expenses: Not hard at all  Food Insecurity: No Food Insecurity (09/26/2023)   Hunger Vital Sign    Worried About Running Out of Food in the Last Year: Never true    Ran Out of Food in the Last Year: Never true  Transportation Needs: No  Transportation Needs (09/26/2023)   PRAPARE - Administrator, Civil Service (Medical): No    Lack of Transportation (Non-Medical): No  Physical Activity: Inactive (09/26/2023)   Exercise Vital Sign    Days of Exercise per Week: 0 days    Minutes of Exercise per Session: 130 min  Stress: No Stress Concern Present (09/26/2023)   [employer] of Occupational Health - Occupational Stress Questionnaire    Feeling of Stress : Not at all  Social Connections: Socially Integrated (09/26/2023)   Social Connection and Isolation Panel [NHANES]    Frequency of Communication with Friends and Family: Once a week    Frequency of Social Gatherings with Friends and Family: Twice a week    Attends Religious Services: More than 4 times per year    Active Member of [employer] or Organizations: Yes    Attends Engineer, structural: More than 4 times per year    Marital Status: Married  Catering manager Violence: Not At Risk (03/10/2023)   Humiliation, Afraid, Rape, and Kick questionnaire    Fear of Current or Ex-Partner: No    Emotionally Abused: No    Physically Abused: No    Sexually Abused: No   Family Status  Relation Name Status   Mother Mother Deceased   Father  (Not Specified)   Sister  (Not Specified)   Sister Arline Asp (Not Specified)   Mat Aunt  (Not Specified)   Neg Hx  (Not Specified)  No partnership data on file   Family History  Problem Relation Age of Onset   Cancer Mother        rectal   Diabetes Mother    Hypertension Mother    Stroke Mother        in 22s   Rectal cancer Mother    Colon polyps Father    Heart attack Father        83 & 93   Hyperlipidemia Sister    Hypertension Sister    Hyperlipidemia Sister    Breast cancer Maternal Aunt    Colon cancer Neg Hx    Esophageal cancer Neg Hx    Ulcerative colitis Neg Hx    Allergies  Allergen Reactions   Penicillins Itching and Rash   Bacitracin Hives      Review of Systems  Constitutional:  Negative  for chills, fever and malaise/fatigue.  HENT:  Negative for congestion and hearing loss.   Eyes:  Negative for discharge.  Respiratory:  Negative for cough, sputum production and shortness of breath.   Cardiovascular:  Negative for chest pain, palpitations and leg swelling.  Gastrointestinal:  Negative for abdominal pain, blood in stool, constipation, diarrhea, heartburn, nausea and vomiting.  Genitourinary:  Negative for dysuria, frequency, hematuria and urgency.  Musculoskeletal:  Negative for back pain, falls and myalgias.  Skin:  Negative for rash.  Neurological:  Negative for dizziness,  sensory change, loss of consciousness, weakness and headaches.  Endo/Heme/Allergies:  Negative for environmental allergies. Does not bruise/bleed easily.  Psychiatric/Behavioral:  Negative for depression and suicidal ideas. The patient is not nervous/anxious and does not have insomnia.       Objective:     BP 120/80 (BP Location: Right Arm, Patient Position: Sitting, Cuff Size: Normal)   Pulse 74   Temp 97.9 F (36.6 C) (Oral)   Resp 16   Ht 5' 1.5" (1.562 m)   Wt 137 lb 6.4 oz (62.3 kg)   SpO2 100%   BMI 25.54 kg/m  BP Readings from Last 3 Encounters:  10/03/23 120/80  04/21/23 100/70  11/18/22 110/70   Wt Readings from Last 3 Encounters:  10/03/23 137 lb 6.4 oz (62.3 kg)  04/21/23 138 lb (62.6 kg)  11/18/22 146 lb 12.8 oz (66.6 kg)   SpO2 Readings from Last 3 Encounters:  10/03/23 100%  04/21/23 96%  11/18/22 96%      Physical Exam Vitals and nursing note reviewed.  Constitutional:      General: She is not in acute distress.    Appearance: Normal appearance. She is well-developed.  HENT:     Head: Normocephalic and atraumatic.     Right Ear: Tympanic membrane, ear canal and external ear normal. There is no impacted cerumen.     Left Ear: Tympanic membrane, ear canal and external ear normal. There is no impacted cerumen.     Nose: Nose normal.     Mouth/Throat:     Mouth:  Mucous membranes are moist.     Pharynx: Oropharynx is clear. No oropharyngeal exudate or posterior oropharyngeal erythema.  Eyes:     General: No scleral icterus.       Right eye: No discharge.        Left eye: No discharge.     Conjunctiva/sclera: Conjunctivae normal.     Pupils: Pupils are equal, round, and reactive to light.  Neck:     Thyroid: No thyromegaly or thyroid tenderness.     Vascular: No JVD.  Cardiovascular:     Rate and Rhythm: Normal rate and regular rhythm.     Heart sounds: Normal heart sounds. No murmur heard. Pulmonary:     Effort: Pulmonary effort is normal. No respiratory distress.     Breath sounds: Normal breath sounds.  Abdominal:     General: Bowel sounds are normal. There is no distension.     Palpations: Abdomen is soft. There is no mass.     Tenderness: There is no abdominal tenderness. There is no guarding or rebound.  Genitourinary:    Vagina: Normal.  Musculoskeletal:        General: Normal range of motion.     Cervical back: Normal range of motion and neck supple.     Right lower leg: No edema.     Left lower leg: No edema.  Lymphadenopathy:     Cervical: No cervical adenopathy.  Skin:    General: Skin is warm and dry.     Findings: No erythema or rash.  Neurological:     Mental Status: She is alert and oriented to person, place, and time.     Cranial Nerves: No cranial nerve deficit.     Deep Tendon Reflexes: Reflexes are normal and symmetric.  Psychiatric:        Mood and Affect: Mood normal.        Behavior: Behavior normal.        Thought  Content: Thought content normal.        Judgment: Judgment normal.      No results found for any visits on 10/03/23.  Last CBC Lab Results  Component Value Date   WBC 7.0 04/21/2023   HGB 14.4 04/21/2023   HCT 45.1 04/21/2023   MCV 89.0 04/21/2023   MCH 29.0 10/05/2020   RDW 14.2 04/21/2023   PLT 241.0 04/21/2023   Last metabolic panel Lab Results  Component Value Date   GLUCOSE 80  04/21/2023   NA 142 04/21/2023   K 4.9 04/21/2023   CL 106 04/21/2023   CO2 31 04/21/2023   BUN 22 04/21/2023   CREATININE 0.67 04/21/2023   GFR 89.90 04/21/2023   CALCIUM 9.8 04/21/2023   PROT 6.4 04/21/2023   ALBUMIN 4.0 04/21/2023   BILITOT 0.5 04/21/2023   ALKPHOS 86 04/21/2023   AST 26 04/21/2023   ALT 41 (H) 04/21/2023   ANIONGAP 5 10/05/2020   Last lipids Lab Results  Component Value Date   CHOL 108 04/21/2023   HDL 37.80 (L) 04/21/2023   LDLCALC 59 04/21/2023   TRIG 54.0 04/21/2023   CHOLHDL 3 04/21/2023   Last hemoglobin A1c Lab Results  Component Value Date   HGBA1C 5.7 04/21/2023   Last thyroid functions Lab Results  Component Value Date   TSH 2.05 03/25/2015   Last vitamin D Lab Results  Component Value Date   VD25OH 36.77 11/18/2022   Last vitamin B12 and Folate No results found for: "VITAMINB12", "FOLATE"    The ASCVD Risk score (Arnett DK, et al., 2019) failed to calculate for the following reasons:   The valid total cholesterol range is 130 to 320 mg/dL    Assessment & Plan:   Problem List Items Addressed This Visit       Unprioritized   Hyperlipidemia associated with type 2 diabetes mellitus (HCC)   Relevant Orders   Lipid panel   Comprehensive metabolic panel   Hyperlipidemia    Encourage heart healthy diet such as MIND or DASH diet, increase exercise, avoid trans fats, simple carbohydrates and processed foods, consider a krill or fish or flaxseed oil cap daily.        Essential hypertension    Well controlled, no changes to meds. Encouraged heart healthy diet such as the DASH diet and exercise as tolerated.        Relevant Orders   Lipid panel   CBC with Differential/Platelet   Comprehensive metabolic panel   Diet-controlled diabetes mellitus (HCC)    hgba1c to be checked  , minimize simple carbs. Increase exercise as tolerated. Continue current meds       Relevant Orders   CBC with Differential/Platelet   Hemoglobin  A1c   Microalbumin / creatinine urine ratio   Other Visit Diagnoses     Type 2 diabetes mellitus with hyperglycemia, without long-term current use of insulin (HCC)    -  Primary   Relevant Orders   Comprehensive metabolic panel   Hemoglobin A1c   Microalbumin / creatinine urine ratio   Need for influenza vaccination       Relevant Orders   Flu Vaccine Trivalent High Dose (Fluad) (Completed)     Assessment and Plan    Osteoporosis   Her recent bone density scan shows a worsening condition despite taking vitamin D and calcium supplements. We discussed treatment options including Fosamax, Prolia, and Vytone. Due to potential exacerbation of reflux symptoms, Fosamax is not preferred. Prolia, a biannual injection,  and Vytone, a monthly injection for one year before switching to Prolia, are considered based on insurance coverage. She is advised to engage in weight-bearing exercises and research local osteoporosis-specific exercise programs. Her higher risk factors include family history, low body weight, and chronic steroid use. We will discuss bone density results with a specialist and consider Prolia or Vytone if covered by insurance.  Gastroesophageal Reflux Disease (GERD)   She is currently managing her GERD with pantoprazole as needed. Given the potential for Fosamax to exacerbate reflux symptoms, it is advised to avoid this medication. She will continue using pantoprazole as needed.  Type 2 Diabetes Mellitus   Her diabetes is diet-controlled, and she is on Mounjaro primarily for weight management. There have been no recent blood sugar checks. She will continue her current diabetes management and monitor blood sugar levels periodically.  Hyperlipidemia   She is on simvastatin for hyperlipidemia with no new issues reported. She will continue with simvastatin.  General Health Maintenance   She is up to date on pneumonia vaccinations and due for a flu shot. Her last tetanus vaccination was  received in 2018, with the next due in 2028. She had a recent eye exam with Dr. Randon Goldsmith in June. We will administer the flu shot, schedule the next pneumonia shot for 2028, schedule the next tetanus shot for 2028, and confirm eye exam records from Dr. Randon Goldsmith.  Follow-up   She will follow up with a specialist to discuss bone density results and treatment options.        No follow-ups on file.    Donato Schultz, DO

## 2023-10-04 ENCOUNTER — Encounter: Payer: Self-pay | Admitting: Family Medicine

## 2023-10-12 ENCOUNTER — Other Ambulatory Visit: Payer: Self-pay

## 2023-10-12 DIAGNOSIS — E875 Hyperkalemia: Secondary | ICD-10-CM

## 2023-10-12 NOTE — Addendum Note (Signed)
Addended by: Roxanne Gates on: 10/12/2023 04:29 PM   Modules accepted: Orders

## 2023-10-13 ENCOUNTER — Telehealth: Payer: Self-pay | Admitting: Family Medicine

## 2023-10-13 DIAGNOSIS — E559 Vitamin D deficiency, unspecified: Secondary | ICD-10-CM

## 2023-10-13 NOTE — Telephone Encounter (Signed)
Lab added

## 2023-10-13 NOTE — Addendum Note (Signed)
Addended by: Roxanne Gates on: 10/13/2023 11:26 AM   Modules accepted: Orders

## 2023-10-13 NOTE — Telephone Encounter (Signed)
Pt was called to schedule lab appt for BMET. Pt also asked if she could have her Vitamin D checked as well.

## 2023-10-16 ENCOUNTER — Other Ambulatory Visit (INDEPENDENT_AMBULATORY_CARE_PROVIDER_SITE_OTHER): Payer: PPO

## 2023-10-16 DIAGNOSIS — E875 Hyperkalemia: Secondary | ICD-10-CM

## 2023-10-16 DIAGNOSIS — E559 Vitamin D deficiency, unspecified: Secondary | ICD-10-CM

## 2023-10-16 LAB — BASIC METABOLIC PANEL
BUN: 14 mg/dL (ref 6–23)
CO2: 30 meq/L (ref 19–32)
Calcium: 9.2 mg/dL (ref 8.4–10.5)
Chloride: 107 meq/L (ref 96–112)
Creatinine, Ser: 0.66 mg/dL (ref 0.40–1.20)
GFR: 89.92 mL/min (ref >60.00)
Glucose, Bld: 99 mg/dL (ref 70–99)
Potassium: 4.5 meq/L (ref 3.5–5.1)
Sodium: 143 meq/L (ref 135–145)

## 2023-10-16 LAB — VITAMIN D 25 HYDROXY (VIT D DEFICIENCY, FRACTURES): VITD: 28.34 ng/mL — ABNORMAL LOW (ref 30.00–100.00)

## 2023-10-17 ENCOUNTER — Encounter: Payer: Self-pay | Admitting: Family Medicine

## 2023-10-17 NOTE — Telephone Encounter (Signed)
 Care team updated and letter sent for eye exam notes.

## 2023-10-19 ENCOUNTER — Other Ambulatory Visit: Payer: Self-pay | Admitting: Family Medicine

## 2023-10-19 DIAGNOSIS — E1165 Type 2 diabetes mellitus with hyperglycemia: Secondary | ICD-10-CM

## 2023-10-25 ENCOUNTER — Other Ambulatory Visit: Payer: Self-pay | Admitting: Family Medicine

## 2023-10-31 DIAGNOSIS — M810 Age-related osteoporosis without current pathological fracture: Secondary | ICD-10-CM | POA: Diagnosis not present

## 2023-10-31 DIAGNOSIS — N952 Postmenopausal atrophic vaginitis: Secondary | ICD-10-CM | POA: Diagnosis not present

## 2023-10-31 DIAGNOSIS — Z124 Encounter for screening for malignant neoplasm of cervix: Secondary | ICD-10-CM | POA: Diagnosis not present

## 2023-10-31 DIAGNOSIS — Z6825 Body mass index (BMI) 25.0-25.9, adult: Secondary | ICD-10-CM | POA: Diagnosis not present

## 2023-11-22 ENCOUNTER — Ambulatory Visit: Payer: PPO | Admitting: Physician Assistant

## 2023-11-22 VITALS — BP 130/80 | HR 94 | Temp 98.4°F | Ht 61.5 in | Wt 139.1 lb

## 2023-11-22 DIAGNOSIS — R6889 Other general symptoms and signs: Secondary | ICD-10-CM

## 2023-11-22 DIAGNOSIS — U071 COVID-19: Secondary | ICD-10-CM | POA: Diagnosis not present

## 2023-11-22 LAB — POCT INFLUENZA A/B
Influenza A, POC: NEGATIVE
Influenza B, POC: NEGATIVE

## 2023-11-22 LAB — POC COVID19 BINAXNOW: SARS Coronavirus 2 Ag: POSITIVE — AB

## 2023-11-22 MED ORDER — NIRMATRELVIR/RITONAVIR (PAXLOVID)TABLET: 3.0000 | ORAL_TABLET | Freq: Two times a day (BID) | ORAL | 0 refills | Status: DC

## 2023-11-22 MED ORDER — NIRMATRELVIR/RITONAVIR (PAXLOVID)TABLET: 3.0000 | ORAL_TABLET | Freq: Two times a day (BID) | ORAL | 0 refills | Status: AC

## 2023-11-22 NOTE — Progress Notes (Signed)
Established patient visit   Patient: Miranda Gonzales   DOB: [birth year]   68 y.o. Female  MRN: [id number] Visit Date: 11/22/2023  Today's healthcare provider: Alfredia Ferguson, PA-C   Chief Complaint  Patient presents with   Flu like symptoms    Symps started Monday. Cough, headache, little nausated. Did have fever last night-    Subjective    Pt reports headache, cough, nasal congestion, nausea, fever of 100.40F last night. Denies SOB. Taking tylenol otc. Her husband was recently dx with covid.  Medications: Outpatient Medications Prior to Visit  Medication Sig   escitalopram (LEXAPRO) 10 MG tablet Take 1 tablet (10 mg total) by mouth daily.   Lysine 500 MG TABS Take by mouth.   Nutritional Supplements (DHEA PO) Take by mouth daily.   OVER THE COUNTER MEDICATION Iron, one tablet daily.   OVER THE COUNTER MEDICATION Tumeric, one capsule daily.   OVER THE COUNTER MEDICATION Eye vitamin, doesn't know the name.   OVER THE COUNTER MEDICATION Magnesium, two tablets daily.   OVER THE COUNTER MEDICATION Calcium, 4 tablets daily.   OVER THE COUNTER MEDICATION 100 mg. DIM 100mg  once daily   OVER THE COUNTER MEDICATION Sulforaphone 400mg  once daily   pantoprazole (PROTONIX) 40 MG tablet TAKE 1 TABLET(40 MG) BY MOUTH DAILY   simvastatin (ZOCOR) 20 MG tablet Take 1 tablet (20 mg total) by mouth at bedtime.   tirzepatide (MOUNJARO) 15 MG/0.5ML Pen INJECT 15 MG INTO THE SKIN  ONCE A WEEK   No facility-administered medications prior to visit.    Review of Systems  Constitutional:  Positive for fatigue. Negative for fever.  HENT:  Positive for congestion.   Respiratory:  Positive for cough. Negative for shortness of breath.   Cardiovascular:  Negative for chest pain and leg swelling.  Gastrointestinal:  Negative for abdominal pain.  Neurological:  Positive for headaches. Negative for dizziness.       Objective    BP 130/80   Pulse 94   Temp 98.4 F (36.9 C) (Oral)   Ht 5'  1.5" (1.562 m)   Wt 139 lb 2 oz (63.1 kg)   SpO2 97%   BMI 25.86 kg/m    Physical Exam Constitutional:      General: She is awake.     Appearance: She is well-developed.  HENT:     Head: Normocephalic.     Right Ear: Tympanic membrane normal.     Left Ear: Tympanic membrane normal.     Mouth/Throat:     Pharynx: Posterior oropharyngeal erythema present. No oropharyngeal exudate.  Eyes:     Conjunctiva/sclera: Conjunctivae normal.  Cardiovascular:     Rate and Rhythm: Normal rate and regular rhythm.     Heart sounds: Normal heart sounds.  Pulmonary:     Effort: Pulmonary effort is normal.     Breath sounds: Normal breath sounds. No wheezing, rhonchi or rales.  Skin:    General: Skin is warm.  Neurological:     Mental Status: She is alert and oriented to person, place, and time.  Psychiatric:        Attention and Perception: Attention normal.        Mood and Affect: Mood normal.        Speech: Speech normal.        Behavior: Behavior is cooperative.      Results for orders placed or performed in visit on 11/22/23  POCT Influenza A/B  Result Value Ref Range  Influenza A, POC Negative Negative   Influenza B, POC Negative Negative  POC COVID-19  Result Value Ref Range   SARS Coronavirus 2 Ag Positive (A) Negative    Assessment & Plan    COVID-19 -     nirmatrelvir/ritonavir; Take 3 tablets by mouth 2 (two) times daily for 5 days. (Take nirmatrelvir 150 mg two tablets twice daily for 5 days and ritonavir 100 mg one tablet twice daily for 5 days) Please stop simvastatin while you take paxlovid  Dispense: 30 tablet; Refill: 0  Flu-like symptoms -     POCT Influenza A/B -     POC COVID-19 BinaxNow  Discussed use of antiviral for covid, rest, hydration, otc antihistamines.   Return if symptoms worsen or fail to improve.       Alfredia Ferguson, PA-C  [facility] [phone] (phone) [phone] (fax)  [facility]

## 2023-11-23 DIAGNOSIS — M810 Age-related osteoporosis without current pathological fracture: Secondary | ICD-10-CM | POA: Diagnosis not present

## 2023-11-29 DIAGNOSIS — R7401 Elevation of levels of liver transaminase levels: Secondary | ICD-10-CM | POA: Diagnosis not present

## 2024-01-04 ENCOUNTER — Telehealth: Payer: Self-pay | Admitting: Family Medicine

## 2024-01-04 NOTE — Telephone Encounter (Signed)
 Copied from CRM [id number]. Topic: Medicare AWV >> Jan 04, 2024  1:31 PM Payton Doughty wrote: Reason for CRM: Called LVM 01/04/2024 to schedule AWV. Please schedule office or virtual visits  Verlee Rossetti; Care Guide Ambulatory Clinical Support [facility] Direct Dial: [phone]

## 2024-02-27 ENCOUNTER — Encounter: Payer: Self-pay | Admitting: Family Medicine

## 2024-02-27 NOTE — Telephone Encounter (Signed)
 Please advise. Pt is not on blood pressure medication

## 2024-03-02 ENCOUNTER — Other Ambulatory Visit: Payer: Self-pay | Admitting: Family Medicine

## 2024-03-02 DIAGNOSIS — E785 Hyperlipidemia, unspecified: Secondary | ICD-10-CM

## 2024-03-04 DIAGNOSIS — H04123 Dry eye syndrome of bilateral lacrimal glands: Secondary | ICD-10-CM | POA: Diagnosis not present

## 2024-03-04 DIAGNOSIS — H2513 Age-related nuclear cataract, bilateral: Secondary | ICD-10-CM | POA: Diagnosis not present

## 2024-03-04 DIAGNOSIS — H5213 Myopia, bilateral: Secondary | ICD-10-CM | POA: Diagnosis not present

## 2024-03-04 DIAGNOSIS — E119 Type 2 diabetes mellitus without complications: Secondary | ICD-10-CM | POA: Diagnosis not present

## 2024-03-04 LAB — HM DIABETES EYE EXAM

## 2024-03-13 ENCOUNTER — Ambulatory Visit (INDEPENDENT_AMBULATORY_CARE_PROVIDER_SITE_OTHER)

## 2024-03-13 VITALS — BP 130/80 | Ht 61.5 in | Wt 135.0 lb

## 2024-03-13 DIAGNOSIS — Z0000 Encounter for general adult medical examination without abnormal findings: Secondary | ICD-10-CM

## 2024-03-13 DIAGNOSIS — Z2821 Immunization not carried out because of patient refusal: Secondary | ICD-10-CM

## 2024-03-13 NOTE — Progress Notes (Signed)
 Because this visit was a virtual/telehealth visit,  certain criteria was not obtained, such a blood pressure, CBG if applicable, and timed get up and go. Any medications not marked as "taking" were not mentioned during the medication reconciliation part of the visit. Any vitals not documented were not able to be obtained due to this being a telehealth visit or patient was unable to self-report a recent blood pressure reading due to a lack of equipment at home via telehealth. Vitals that have been documented are verbally provided by the patient.   This visit was performed by a medical professional under my direct supervision. I was immediately available for consultation/collaboration. I have reviewed and agree with the Annual Wellness Visit documentation.  Subjective:   Miranda Gonzales is a 68 y.o. who presents for a Medicare Wellness preventive visit.  As a reminder, Annual Wellness Visits don't include a physical exam, and some assessments may be limited, especially if this visit is performed virtually. We may recommend an in-person follow-up visit with your provider if needed.  Visit Complete: Virtual I connected with  Lynnette Saucer on 03/13/24 by a audio enabled telemedicine application and verified that I am speaking with the correct person using two identifiers.  Patient Location: Home  Provider Location: Home Office  I discussed the limitations of evaluation and management by telemedicine. The patient expressed understanding and agreed to proceed.  Vital Signs: Because this visit was a virtual/telehealth visit, some criteria may be missing or patient reported. Any vitals not documented were not able to be obtained and vitals that have been documented are patient reported.  VideoDeclined- This patient declined Librarian, academic. Therefore the visit was completed with audio only.  Persons Participating in Visit: Patient.  AWV Questionnaire: No: Patient  Medicare AWV questionnaire was not completed prior to this visit.  Cardiac Risk Factors include: advanced age (>4men, >98 women);diabetes mellitus;dyslipidemia     Objective:     Today's Vitals   03/13/24 1524  BP: 130/80  Weight: 135 lb (61.2 kg)  Height: 5' 1.5" (1.562 m)   Body mass index is 25.09 kg/m.     03/13/2024    3:24 PM 03/10/2023    3:40 PM 03/08/2022    1:03 PM 11/02/2020   10:17 AM 10/05/2020    1:08 PM 08/12/2014   11:09 AM 07/28/2014    2:26 PM  Advanced Directives  Does Patient Have a Medical Advance Directive? Yes No No No No No No  Type of Estate agent of [place];Living will        Does patient want to make changes to medical advance directive? No - Patient declined        Copy of Healthcare Power of Attorney in Chart? Yes - validated most recent copy scanned in chart (See row information)        Would patient like information on creating a medical advance directive?  No - Patient declined No - Patient declined Yes (MAU/Ambulatory/Procedural Areas - Information given) No - Patient declined No - patient declined information No - patient declined information    Current Medications (verified) Outpatient Encounter Medications as of 03/13/2024  Medication Sig   escitalopram  (LEXAPRO ) 10 MG tablet Take 1 tablet (10 mg total) by mouth daily.   Lysine 500 MG TABS Take by mouth.   Nutritional Supplements (DHEA PO) Take by mouth daily.   OVER THE COUNTER MEDICATION Iron, one tablet daily.   OVER THE COUNTER MEDICATION Tumeric, one capsule  daily.   OVER THE COUNTER MEDICATION Eye vitamin, doesn't know the name.   OVER THE COUNTER MEDICATION Magnesium, two tablets daily.   OVER THE COUNTER MEDICATION Calcium, 4 tablets daily.   OVER THE COUNTER MEDICATION 100 mg. DIM 100mg  once daily   OVER THE COUNTER MEDICATION Sulforaphone 400mg  once daily   pantoprazole  (PROTONIX ) 40 MG tablet TAKE 1 TABLET(40 MG) BY MOUTH DAILY   simvastatin  (ZOCOR ) 20 MG  tablet Take 1 tablet (20 mg total) by mouth at bedtime. Pt needs office visit for further refills   tirzepatide  (MOUNJARO ) 15 MG/0.5ML Pen INJECT 15 MG INTO THE SKIN  ONCE A WEEK   No facility-administered encounter medications on file as of 03/13/2024.    Allergies (verified) Penicillins and Bacitracin   History: Past Medical History:  Diagnosis Date   Allergy    Anxiety    Diabetes mellitus without complication (HCC)    GERD (gastroesophageal reflux disease)    Hyperlipidemia    Hypertension    Osteoporosis    Past Surgical History:  Procedure Laterality Date   COLONOSCOPY W/ POLYPECTOMY  04/2009   due 2015   COLONOSCOPY WITH PROPOFOL  N/A 08/12/2014   Procedure: COLONOSCOPY WITH PROPOFOL ;  Surgeon: Garrett Kallman, MD;  Location: [facility];  Service: Endoscopy;  Laterality: N/A;   TUBAL LIGATION     Uterine polyps     Family History  Problem Relation Age of Onset   Cancer Mother        rectal   Diabetes Mother    Hypertension Mother    Stroke Mother        in 26s   Rectal cancer Mother    Colon polyps Father    Heart attack Father        71 & 59   Hyperlipidemia Sister    Hypertension Sister    Hyperlipidemia Sister    Breast cancer Maternal Aunt    Colon cancer Neg Hx    Esophageal cancer Neg Hx    Ulcerative colitis Neg Hx    Social History   Socioeconomic History   Marital status: Married    Spouse name: Not on file   Number of children: Not on file   Years of education: Not on file   Highest education level: 12th grade  Occupational History   Occupation: retired Chief Operating Officer  Tobacco Use   Smoking status: Never   Smokeless tobacco: Never  Vaping Use   Vaping status: Never Used  Substance and Sexual Activity   Alcohol use: No   Drug use: No   Sexual activity: Not Currently    Partners: Male    Birth control/protection: Post-menopausal  Other Topics Concern   Not on file  Social History Narrative   Not on file   Social Drivers of Health    Financial Resource Strain: Low Risk  (11/22/2023)   Overall Financial Resource Strain (CARDIA)    Difficulty of Paying Living Expenses: Not hard at all  Food Insecurity: No Food Insecurity (03/13/2024)   Hunger Vital Sign    Worried About Running Out of Food in the Last Year: Never true    Ran Out of Food in the Last Year: Never true  Transportation Needs: No Transportation Needs (03/13/2024)   PRAPARE - Administrator, Civil Service (Medical): No    Lack of Transportation (Non-Medical): No  Physical Activity: Sufficiently Active (03/13/2024)   Exercise Vital Sign    Days of Exercise per Week: 3 days  Minutes of Exercise per Session: 90 min  Stress: No Stress Concern Present (03/13/2024)   [employer] of Occupational Health - Occupational Stress Questionnaire    Feeling of Stress : Not at all  Social Connections: Moderately Integrated (03/13/2024)   Social Connection and Isolation Panel [NHANES]    Frequency of Communication with Friends and Family: Once a week    Frequency of Social Gatherings with Friends and Family: Once a week    Attends Religious Services: More than 4 times per year    Active Member of [employer] or Organizations: Yes    Attends Engineer, structural: More than 4 times per year    Marital Status: Married    Tobacco Counseling Counseling given: Not Answered    Clinical Intake:  Pre-visit preparation completed: Yes  Pain : No/denies pain     BMI - recorded: 25.09 Nutritional Status: BMI 25 -29 Overweight Nutritional Risks: None Diabetes: No  Lab Results  Component Value Date   HGBA1C 5.7 10/03/2023   HGBA1C 5.7 04/21/2023   HGBA1C 5.7 11/18/2022     How often do you need to have someone help you when you read instructions, pamphlets, or other written materials from your doctor or pharmacy?: 1 - Never What is the last grade level you completed in school?: 12th grade  Interpreter Needed?: No  Information entered by ::  Juliann Ochoa   Activities of Daily Living     03/13/2024    3:28 PM  In your present state of health, do you have any difficulty performing the following activities:  Hearing? 0  Vision? 0  Difficulty concentrating or making decisions? 0  Walking or climbing stairs? 0  Dressing or bathing? 0  Doing errands, shopping? 0  Preparing Food and eating ? N  Using the Toilet? N  In the past six months, have you accidently leaked urine? N  Do you have problems with loss of bowel control? N  Managing your Medications? N  Managing your Finances? N  Housekeeping or managing your Housekeeping? N    Patient Care Team: Crecencio Dodge, Candida Chalk, DO as PCP - General (Family Medicine) Thora Flint, MD as Consulting Physician (Obstetrics and Gynecology) Alvina Axon, MD as Consulting Physician (Ophthalmology) Pa, [facility]  Indicate any recent Medical Services you may have received from other than [facility] providers in the past year (date may be approximate).     Assessment:    This is a routine wellness examination for Zarah.  Hearing/Vision screen Hearing Screening - Comments:: No hearing difficulties  Vision Screening - Comments:: Patient wears glasses   Goals Addressed             This Visit's Progress    Patient Stated   On track    Lose weight        Depression Screen     03/13/2024    3:28 PM 03/10/2023    3:41 PM 11/18/2022   10:11 AM 03/08/2022    1:02 PM 04/29/2021   11:09 AM 11/02/2020   10:11 AM 04/16/2020    8:56 AM  PHQ 2/9 Scores  PHQ - 2 Score 0 0 0 0 0 0 0  PHQ- 9 Score 0     0     Fall Risk     03/13/2024    3:26 PM 03/03/2023   12:43 PM 11/18/2022   10:11 AM 03/08/2022    1:04 PM 04/29/2021   11:09 AM  Fall Risk   Falls in  the past year? 0 0 0 0 0  Number falls in past yr: 0 0 0 0 0  Injury with Fall? 0 0 0 0 0  Risk for fall due to : No Fall Risks No Fall Risks  Impaired vision   Follow up Falls evaluation completed Falls  evaluation completed Falls evaluation completed Falls prevention discussed Falls evaluation completed    MEDICARE RISK AT HOME:  Medicare Risk at Home Any stairs in or around the home?: Yes If so, are there any without handrails?: No Home free of loose throw rugs in walkways, pet beds, electrical cords, etc?: Yes Adequate lighting in your home to reduce risk of falls?: Yes Life alert?: No Use of a cane, walker or w/c?: No Grab bars in the bathroom?: Yes Shower chair or bench in shower?: Yes Elevated toilet seat or a handicapped toilet?: Yes  TIMED UP AND GO:  Was the test performed?  No  Cognitive Function: 6CIT completed    11/02/2020   10:13 AM  MMSE - Mini Mental State Exam  Orientation to time 5  Orientation to Place 5  Registration 3  Attention/ Calculation 5  Recall 3  Language- name 2 objects 2  Language- repeat 1  Language- follow 3 step command 3  Language- read & follow direction 1  Write a sentence 1  Copy design 1  Total score 30        03/13/2024    3:25 PM 03/10/2023    3:49 PM 03/08/2022    1:06 PM  6CIT Screen  What Year? 0 points 0 points 0 points  What month? 0 points 0 points 0 points  What time? 0 points 0 points 0 points  Count back from 20 0 points 0 points 0 points  Months in reverse 0 points 0 points 0 points  Repeat phrase 0 points 0 points 0 points  Total Score 0 points 0 points 0 points    Immunizations Immunization History  Administered Date(s) Administered   Fluad Trivalent(High Dose 65+) 10/03/2023   Influenza,inj,Quad PF,6+ Mos 08/29/2018, 07/02/2019   Influenza-Unspecified 08/29/2020, 09/08/2021, 09/08/2022   PFIZER(Purple Top)SARS-COV-2 Vaccination 01/16/2020, 02/11/2020, 08/29/2020   PNEUMOCOCCAL CONJUGATE-20 11/16/2021   Pfizer Covid-19 Vaccine Bivalent Booster 80yrs & up 11/16/2021   Pneumococcal Conjugate-13 03/30/2015   Pneumococcal Polysaccharide-23 12/23/2016, 11/02/2020   Tdap 12/23/2016   Unspecified SARS-COV-2  Vaccination 01/16/2020, 02/11/2020   Zoster Recombinant(Shingrix) 12/05/2017, 02/12/2018   Zoster, Live 03/30/2015    Screening Tests Health Maintenance  Topic Date Due   COVID-19 Vaccine (7 - 2024-25 season) 06/25/2023   FOOT EXAM  11/19/2023   HEMOGLOBIN A1C  04/02/2024   MAMMOGRAM  04/27/2024   INFLUENZA VACCINE  05/24/2024   Colonoscopy  08/14/2024   Diabetic kidney evaluation - Urine ACR  10/02/2024   Diabetic kidney evaluation - eGFR measurement  10/15/2024   OPHTHALMOLOGY EXAM  03/04/2025   Medicare Annual Wellness (AWV)  03/13/2025   DTaP/Tdap/Td (2 - Td or Tdap) 12/24/2026   Pneumonia Vaccine 39+ Years old  Completed   DEXA SCAN  Completed   Hepatitis C Screening  Completed   Zoster Vaccines- Shingrix  Completed   HPV VACCINES  Aged Out   Meningococcal B Vaccine  Aged Out    Health Maintenance  Health Maintenance Due  Topic Date Due   COVID-19 Vaccine (7 - 2024-25 season) 06/25/2023   FOOT EXAM  11/19/2023   Health Maintenance Items Addressed:   Additional Screening:  Vision Screening: Recommended annual ophthalmology exams  for early detection of glaucoma and other disorders of the eye.  Dental Screening: Recommended annual dental exams for proper oral hygiene  Community Resource Referral / Chronic Care Management: CRR required this visit?  No   CCM required this visit?  No   Plan:    I have personally reviewed and noted the following in the patient's chart:   Medical and social history Use of alcohol, tobacco or illicit drugs  Current medications and supplements including opioid prescriptions. Patient is not currently taking opioid prescriptions. Functional ability and status Nutritional status Physical activity Advanced directives List of other physicians Hospitalizations, surgeries, and ER visits in previous 12 months Vitals Screenings to include cognitive, depression, and falls Referrals and appointments  In addition, I have reviewed and  discussed with patient certain preventive protocols, quality metrics, and best practice recommendations. A written personalized care plan for preventive services as well as general preventive health recommendations were provided to patient.   Freeda Jerry, [place]   03/13/2024   After Visit Summary: (MyChart) Due to this being a telephonic visit, the after visit summary with patients personalized plan was offered to patient via MyChart   Notes: Nothing significant to report at this time.

## 2024-03-13 NOTE — Patient Instructions (Signed)
 Ms. Coble , Thank you for taking time out of your busy schedule to complete your Annual Wellness Visit with me. I enjoyed our conversation and look forward to speaking with you again next year. I, as well as your care team,  appreciate your ongoing commitment to your health goals. Please review the following plan we discussed and let me know if I can assist you in the future. Your Game plan/ To Do List    Referrals: If you haven't heard from the office you've been referred to, please reach out to them at the phone provided.   Follow up Visits: Next Medicare AWV with our clinical staff: patient declined the next appointment    Have you seen your provider in the last 6 months (3 months if uncontrolled diabetes)? Yes Next Office Visit with your provider: n/a  Clinician Recommendations:  Aim for 30 minutes of exercise or brisk walking, 6-8 glasses of water, and 5 servings of fruits and vegetables each day.       This is a list of the screening recommended for you and due dates:  Health Maintenance  Topic Date Due   COVID-19 Vaccine (7 - 2024-25 season) 06/25/2023   Complete foot exam   11/19/2023   Hemoglobin A1C  04/02/2024   Mammogram  04/27/2024   Flu Shot  05/24/2024   Colon Cancer Screening  08/14/2024   Yearly kidney health urinalysis for diabetes  10/02/2024   Yearly kidney function blood test for diabetes  10/15/2024   Eye exam for diabetics  03/04/2025   Medicare Annual Wellness Visit  03/13/2025   DTaP/Tdap/Td vaccine (2 - Td or Tdap) 12/24/2026   Pneumonia Vaccine  Completed   DEXA scan (bone density measurement)  Completed   Hepatitis C Screening  Completed   Zoster (Shingles) Vaccine  Completed   HPV Vaccine  Aged Out   Meningitis B Vaccine  Aged Out    Advanced directives: (In Chart) A copy of your advanced directives are scanned into your chart should your provider ever need it. Advance Care Planning is important because it:  [x]  Makes sure you receive the medical  care that is consistent with your values, goals, and preferences  [x]  It provides guidance to your family and loved ones and reduces their decisional burden about whether or not they are making the right decisions based on your wishes.  Follow the link provided in your after visit summary or read over the paperwork we have mailed to you to help you started getting your Advance Directives in place. If you need assistance in completing these, please reach out to us  so that we can help you!  See attachments for Preventive Care and Fall Prevention Tips.

## 2024-03-19 ENCOUNTER — Other Ambulatory Visit: Payer: Self-pay | Admitting: Obstetrics and Gynecology

## 2024-03-19 DIAGNOSIS — Z1231 Encounter for screening mammogram for malignant neoplasm of breast: Secondary | ICD-10-CM

## 2024-03-21 ENCOUNTER — Other Ambulatory Visit: Payer: Self-pay | Admitting: Family Medicine

## 2024-03-21 DIAGNOSIS — E1165 Type 2 diabetes mellitus with hyperglycemia: Secondary | ICD-10-CM

## 2024-04-12 DIAGNOSIS — X32XXXD Exposure to sunlight, subsequent encounter: Secondary | ICD-10-CM | POA: Diagnosis not present

## 2024-04-12 DIAGNOSIS — L570 Actinic keratosis: Secondary | ICD-10-CM | POA: Diagnosis not present

## 2024-04-12 DIAGNOSIS — B078 Other viral warts: Secondary | ICD-10-CM | POA: Diagnosis not present

## 2024-04-12 DIAGNOSIS — D225 Melanocytic nevi of trunk: Secondary | ICD-10-CM | POA: Diagnosis not present

## 2024-04-12 DIAGNOSIS — L821 Other seborrheic keratosis: Secondary | ICD-10-CM | POA: Diagnosis not present

## 2024-04-12 DIAGNOSIS — L258 Unspecified contact dermatitis due to other agents: Secondary | ICD-10-CM | POA: Diagnosis not present

## 2024-04-16 ENCOUNTER — Other Ambulatory Visit: Payer: Self-pay | Admitting: Family Medicine

## 2024-04-22 DIAGNOSIS — M810 Age-related osteoporosis without current pathological fracture: Secondary | ICD-10-CM | POA: Diagnosis not present

## 2024-05-01 ENCOUNTER — Ambulatory Visit

## 2024-05-02 ENCOUNTER — Ambulatory Visit
Admission: RE | Admit: 2024-05-02 | Discharge: 2024-05-02 | Disposition: A | Discharge status: Home / Self Care | Source: Ambulatory Visit | Attending: Obstetrics and Gynecology | Admitting: Obstetrics and Gynecology

## 2024-05-02 DIAGNOSIS — Z1231 Encounter for screening mammogram for malignant neoplasm of breast: Secondary | ICD-10-CM

## 2024-05-07 ENCOUNTER — Encounter: Payer: Self-pay | Admitting: Family Medicine

## 2024-05-07 ENCOUNTER — Ambulatory Visit: Admitting: Family Medicine

## 2024-05-07 ENCOUNTER — Ambulatory Visit: Payer: Self-pay | Admitting: Family Medicine

## 2024-05-07 VITALS — BP 112/72 | HR 76 | Temp 97.6°F | Ht 61.5 in | Wt 140.0 lb

## 2024-05-07 DIAGNOSIS — M816 Localized osteoporosis [Lequesne]: Secondary | ICD-10-CM

## 2024-05-07 DIAGNOSIS — Z7985 Long-term (current) use of injectable non-insulin antidiabetic drugs: Secondary | ICD-10-CM

## 2024-05-07 DIAGNOSIS — E119 Type 2 diabetes mellitus without complications: Secondary | ICD-10-CM

## 2024-05-07 DIAGNOSIS — E785 Hyperlipidemia, unspecified: Secondary | ICD-10-CM | POA: Diagnosis not present

## 2024-05-07 LAB — URINALYSIS, ROUTINE W REFLEX MICROSCOPIC
Bilirubin Urine: NEGATIVE
Hgb urine dipstick: NEGATIVE
Ketones, ur: NEGATIVE
Leukocytes,Ua: NEGATIVE
Nitrite: NEGATIVE
RBC / HPF: NONE SEEN (ref 0–?)
Specific Gravity, Urine: 1.02 (ref 1.000–1.030)
Total Protein, Urine: NEGATIVE
Urine Glucose: NEGATIVE
Urobilinogen, UA: 0.2 (ref 0.0–1.0)
pH: 6 (ref 5.0–8.0)

## 2024-05-07 LAB — BASIC METABOLIC PANEL WITH GFR
BUN: 23 mg/dL (ref 6–23)
CO2: 31 meq/L (ref 19–32)
Calcium: 10 mg/dL (ref 8.4–10.5)
Chloride: 104 meq/L (ref 96–112)
Creatinine, Ser: 0.74 mg/dL (ref 0.40–1.20)
GFR: 82.61 mL/min (ref >60.00)
Glucose, Bld: 78 mg/dL (ref 70–99)
Potassium: 3.8 meq/L (ref 3.5–5.1)
Sodium: 138 meq/L (ref 135–145)

## 2024-05-07 LAB — HEMOGLOBIN A1C: Hgb A1c MFr Bld: 5.8 % (ref 4.6–6.5)

## 2024-05-07 LAB — MICROALBUMIN / CREATININE URINE RATIO
Creatinine,U: 79.5 mg/dL
Microalb Creat Ratio: UNDETERMINED mg/g (ref 0.0–30.0)
Microalb, Ur: <0.7 mg/dL

## 2024-05-07 NOTE — Progress Notes (Signed)
 [facility] [address] [place] [address] Dept: [phone] Dept Fax: [phone]  Transfer of Care Office Visit  Subjective:    Patient ID: Miranda Gonzales, female    DOB: [birth year], 68 y.o..   MRN: [id number]  Chief Complaint  Patient presents with   Establish Care    [facility]- establish care.   No concerns.       History of Present Illness:  Patient is in today to establish care. Miranda Gonzales was born in [place]. She worked for 30 years for the [employer]. She is now retired. She has been married for 45 years. She has no children. Miranda Gonzales denies use of alcohol, tobacco, or drugs.  Miranda Gonzales has a history of depression that apparently was triggered late in her working career by stressors on her job. She feels like that is all in the past now and finds her mood is doing quite well. She has been treated with escitalopram  10 mg daily, but admits she may not need this.  Miranda Gonzales has a history of Type 2 diabetes. She had been able to manage this with diet and exercise. However, she was overweight at the time. She weight 186 lbs in 12/2021. She was started on tirzepatide  (Mounjaro ) and has been able to lose 46 lbs. She has not resolution of her GERD, hypertension, and her diabetes has been in excellent control.   Miranda Gonzales has a history of hyperlipidemia. She is managed on simvastatin  20 mg daily.  Miranda Gonzales has a history of osteoporosis. She is receiving Prolia injections q 6 months through dr. Nanda office (GYN).  Past Medical History: Patient Active Problem List   Diagnosis Date Noted   History of adenomatous polyp of colon 04/25/2022   Osteoporosis 09/29/2021   Controlled type 2 diabetes mellitus without complication, without long-term current use of insulin (HCC) 04/16/2020   Nonspecific elevation of levels of transaminase or lactic acid dehydrogenase (LDH) 02/28/2014   Hyperlipidemia 01/03/2011   GERD 09/29/2010    Past Surgical History:  Procedure Laterality Date   COLONOSCOPY W/ POLYPECTOMY  04/2009   due 2015   COLONOSCOPY WITH PROPOFOL  N/A 08/12/2014   Procedure: COLONOSCOPY WITH PROPOFOL ;  Surgeon: Miranda MARLA Louder, MD;  Location: [facility];  Service: Endoscopy;  Laterality: N/A;   POLYPECTOMY     Uterine   TUBAL LIGATION     Family History  Problem Relation Age of Onset   Cancer Mother        Rectal   Diabetes Mother    Hypertension Mother    Stroke Mother        in 32s   Colon polyps Father    Heart attack Father        56 & 62   Heart disease Father    Hyperlipidemia Sister    Hypertension Sister    Hyperlipidemia Sister    Cancer Maternal Aunt        Breast   Diabetes Paternal Aunt    Colon cancer Neg Hx    Esophageal cancer Neg Hx    Ulcerative colitis Neg Hx    Outpatient Medications Prior to Visit  Medication Sig Dispense Refill   escitalopram  (LEXAPRO ) 10 MG tablet Take 1 tablet (10 mg total) by mouth daily. 30 tablet 0   MOUNJARO  15 MG/0.5ML Pen INJECT 15 MG INTO THE SKIN ONCE A WEEK 6 mL 0   pantoprazole  (PROTONIX ) 40 MG tablet TAKE 1 TABLET(40 MG) BY MOUTH  DAILY 90 tablet 1   PROLIA 60 MG/ML SOSY injection Inject 1 mL by subcutaneous route for 180 days.     simvastatin  (ZOCOR ) 20 MG tablet Take 1 tablet (20 mg total) by mouth at bedtime. Pt needs office visit for further refills 90 tablet 0   tretinoin (RETIN-A) 0.025 % cream APPLY CREAM TO FACE EVERY DAY AT BEDTIME     Lysine 500 MG TABS Take by mouth.     Nutritional Supplements (DHEA PO) Take by mouth daily.     OVER THE COUNTER MEDICATION Iron, one tablet daily.     OVER THE COUNTER MEDICATION Tumeric, one capsule daily.     OVER THE COUNTER MEDICATION Eye vitamin, doesn't know the name.     OVER THE COUNTER MEDICATION Magnesium, two tablets daily.     OVER THE COUNTER MEDICATION Calcium, 4 tablets daily.     OVER THE COUNTER MEDICATION 100 mg. DIM 100mg  once daily     OVER THE COUNTER MEDICATION  Sulforaphone 400mg  once daily     No facility-administered medications prior to visit.   Allergies  Allergen Reactions   Penicillins Itching and Rash   Bacitracin Hives      Objective:   Today's Vitals   05/07/24 1359  BP: 112/72  Pulse: 76  Temp: 97.6 F (36.4 C)  TempSrc: Temporal  SpO2: 99%  Weight: 140 lb (63.5 kg)  Height: 5' 1.5 (1.562 m)   Body mass index is 26.02 kg/m.   General: Well developed, well nourished. No acute distress. Feet- Skin intact. No sign of maceration between toes. Nails are normal. Dorsalis pedis and posterior tibial   artery pulses are normal. 5.07 monofilament testing normal. Psych: Alert and oriented. Normal mood and affect.  Health Maintenance Due  Topic Date Due   Diabetic kidney evaluation - Urine ACR  Never done   HEMOGLOBIN A1C  04/02/2024   MAMMOGRAM  04/27/2024   Colonoscopy  08/14/2024   Lab Results    Latest Ref Rng & Units 10/16/2023    9:59 AM 10/03/2023   10:13 AM 04/21/2023   10:42 AM  CMP  Glucose 70 - 99 mg/dL 99  92  80   BUN 6 - 23 mg/dL 14  18  22    Creatinine 0.40 - 1.20 mg/dL 9.33  9.36  9.32   Sodium 135 - 145 mEq/L 143  142  142   Potassium 3.5 - 5.1 mEq/L 4.5  5.7 No hemolysis seen  4.9   Chloride 96 - 112 mEq/L 107  107  106   CO2 19 - 32 mEq/L 30  30  31    Calcium 8.4 - 10.5 mg/dL 9.2  9.3  9.8   Total Protein 6.0 - 8.3 g/dL  6.2  6.4   Total Bilirubin 0.2 - 1.2 mg/dL  0.3  0.5   Alkaline Phos 39 - 117 U/L  89  86   AST 0 - 37 U/L  30  26   ALT 0 - 35 U/L  48  41    Last lipids Lab Results  Component Value Date   CHOL 139 10/03/2023   HDL 45.70 10/03/2023   LDLCALC 82 10/03/2023   TRIG 60.0 10/03/2023   CHOLHDL 3 10/03/2023   Last hemoglobin A1c Lab Results  Component Value Date   HGBA1C 5.7 10/03/2023     Assessment & Plan:   Problem List Items Addressed This Visit       Endocrine   Controlled type 2 diabetes  mellitus without complication, without long-term current use of insulin  (HCC) - Primary   Diabetes has been in excellent control. I will check annual DM labs today. Continue tirzepatide  (Mounjaro ) 15 mg twice a month.      Relevant Orders   Microalbumin / creatinine urine ratio   Basic metabolic panel with GFR   Hemoglobin A1c   Urinalysis, Routine w reflex microscopic     Musculoskeletal and Integument   Osteoporosis   Continue Prolia injections with Dr. Tomblin. I do recommend daily supplementation with calcium and Vit. D.      Relevant Medications   PROLIA 60 MG/ML SOSY injection     Other   Hyperlipidemia   LDL cholesterol is at goal. Continue simvastatin  20 mg daily.       Return in about 6 months (around 11/07/2024) for Reassessment.   Garnette CHRISTELLA Simpler, MD

## 2024-05-07 NOTE — Assessment & Plan Note (Signed)
 Diabetes has been in excellent control. I will check annual DM labs today. Continue tirzepatide  (Mounjaro ) 15 mg twice a month.

## 2024-05-07 NOTE — Assessment & Plan Note (Signed)
 LDL cholesterol is at goal. Continue simvastatin  20 mg daily.

## 2024-05-07 NOTE — Assessment & Plan Note (Signed)
 Continue Prolia injections with Dr. Tomblin. I do recommend daily supplementation with calcium and Vit. D.

## 2024-05-12 ENCOUNTER — Encounter: Payer: Self-pay | Admitting: Family Medicine

## 2024-05-12 DIAGNOSIS — F39 Unspecified mood [affective] disorder: Secondary | ICD-10-CM

## 2024-05-13 MED ORDER — ESCITALOPRAM OXALATE 10 MG PO TABS: 10.0000 mg | ORAL_TABLET | Freq: Every day | ORAL | 3 refills | Status: AC

## 2024-05-22 DIAGNOSIS — M810 Age-related osteoporosis without current pathological fracture: Secondary | ICD-10-CM | POA: Diagnosis not present

## 2024-05-27 ENCOUNTER — Encounter: Payer: Self-pay | Admitting: Family Medicine

## 2024-05-27 ENCOUNTER — Other Ambulatory Visit: Payer: Self-pay | Admitting: Family Medicine

## 2024-05-27 DIAGNOSIS — E785 Hyperlipidemia, unspecified: Secondary | ICD-10-CM

## 2024-05-27 NOTE — Telephone Encounter (Signed)
 Refill request for  Simvastatin  LR hx provider LOV 05/07/24 Please review and advise.  Thanks.  Dm/cma

## 2024-05-28 MED ORDER — SIMVASTATIN 20 MG PO TABS
20.0000 mg | ORAL_TABLET | Freq: Every day | ORAL | 3 refills | Status: AC
Start: 2024-05-28 — End: ?

## 2024-06-18 ENCOUNTER — Encounter: Payer: Self-pay | Admitting: Pharmacist

## 2024-06-18 NOTE — Progress Notes (Signed)
 Pharmacy Quality Measure Review  This patient is appearing on a report for being at risk of failing the adherence measure for diabetes medications this calendar year.   Medication: Mounjaro  Last fill date: 03/22/2024 for 84 day supply She also filled Mounjaro  on 10/27/23; 12/08/23 and 02/10/24 for 28 day supply each time.  Refills show 68 days of coverage gaps so far in 2025.    Lab Results  Component Value Date   HGBA1C 5.8 05/07/2024   Last Rx was prescribed by Dr Cruz but patient is now seeing Dr Thedora at [facility] office.   I tried to call patient to see if she needs refill fro Mounjaro  but has to leave a message. Will forward to Channing Mealing PharmD who covers the [employer] to facilitate refill from their office if needed.   Madelin Ray, PharmD Clinical Pharmacist [facility]  Population Health [phone]

## 2024-06-20 ENCOUNTER — Telehealth: Payer: Self-pay

## 2024-06-20 NOTE — Progress Notes (Signed)
   06/20/2024  Patient ID: Miranda Gonzales, female   DOB: 1956/08/24, 67 y.o.   MRN: [id number]  Patient outreach attempt to follow-up on facilitating a refill for Mounjaro .  This medication was prescribed by former PCP, and was last prescribed in May for an 84 day supply; so it is likely a new prescription needs to be sent to the patient's pharmacy.  I was not able to reach her but did leave a HIPAA compliant voicemail with my direct phone number and will send a MyChart message.  Miranda Gonzales, PharmD, DPLA

## 2024-08-08 ENCOUNTER — Encounter: Payer: Self-pay | Admitting: Family Medicine

## 2024-08-12 ENCOUNTER — Telehealth: Payer: Self-pay

## 2024-08-12 DIAGNOSIS — E1165 Type 2 diabetes mellitus with hyperglycemia: Secondary | ICD-10-CM

## 2024-08-12 MED ORDER — MOUNJARO 15 MG/0.5ML ~~LOC~~ SOAJ: 15.0000 mg | SUBCUTANEOUS | 1 refills | Status: AC

## 2024-08-12 NOTE — Progress Notes (Signed)
   08/12/2024  Patient ID: Miranda Gonzales, female   DOB: April 10, 1956, 68 y.o.   MRN: [id number]  In basket message from patient requesting refill on Mounjaro  15mg  weekly.  Prescription last sent 5/30 was for a 90 day supply with no addition refills.  Patient saw Dr. Thedora 05/07/24 (A1c well controlled at 5.8%) and has another follow-up 11/04/24.  Order pending for PCP to sign if in agreement.    Channing DELENA Mealing, PharmD, DPLA

## 2024-08-14 NOTE — Progress Notes (Unsigned)
   LILLETTE Ileana Collet, PhD, LAT, ATC acting as a scribe for Artist Lloyd, MD.  Miranda Gonzales is a 68 y.o. female who presents to [facility] today for osteoporosis management. Family hx of breast cancer.  DEXA scan (date, T-score): 08/22/23: Spine= -2.9, L-FN= -2.1, R-FN= -2.2 Prior treatment: Prolia- 1st injection Jan and she was getting random water blisters, July 2nd-- terrible stomach pain, nausea/vomiting History of Hip, Spine, or Wrist Fx: no Heart disease or stroke: no Cancer: no Kidney Disease: no Gastric/Peptic Ulcer: no Gastric bypass surgery: no Severe GERD: no Hx of seizures: no Age at Menopause: ~68y/o Calcium intake: yes- 2000mg  Vitamin D  intake: yes Hormone replacement therapy: none Smoking history: never smoked Alcohol: rarely Exercise: was going to the gym, but hasn't been going lately Major dental work in past year: no Parents with hip/spine fracture: no Height loss: none   Pertinent review of systems: No fevers or chills  Relevant historical information: Diabetes   Exam:  BP 108/72   Pulse 75   Ht 5' 1.5 (1.562 m)   Wt 137 lb (62.1 kg)   SpO2 99%   BMI 25.47 kg/m  General: Well Developed, well nourished, and in no acute distress.   MSK: Normal gait    Lab and Radiology Results No results found for this or any previous visit (from the past 72 hours). No results found.     Assessment and Plan: 68 y.o. female with osteoporosis.  Patient has been on Prolia now for 2 different rounds and has not tolerated it well at all.  We talked about options.  Recommend maximizing and optimizing vitamin D  and continuing calcium.  She is not doing much weightbearing exercise and we talked about optimizing that as well and doing some strategizing about options.  For medications she has had 2 different reactions relatively quickly after taking Prolia which indicates that this is probably not a good medication option for her.  After  discussion we will work on Reclast infusion.  Will want to check vitamin D  in a few months.   PDMP not reviewed this encounter. No orders of the defined types were placed in this encounter.  No orders of the defined types were placed in this encounter.    Discussed warning signs or symptoms. Please see discharge instructions. Patient expresses understanding.   The above documentation has been reviewed and is accurate and complete Artist Lloyd, M.D.

## 2024-08-15 ENCOUNTER — Telehealth: Payer: Self-pay

## 2024-08-15 ENCOUNTER — Telehealth: Payer: Self-pay | Admitting: Pharmacy Technician

## 2024-08-15 ENCOUNTER — Ambulatory Visit: Admitting: Family Medicine

## 2024-08-15 VITALS — BP 108/72 | HR 75 | Ht 61.5 in | Wt 137.0 lb

## 2024-08-15 DIAGNOSIS — M810 Age-related osteoporosis without current pathological fracture: Secondary | ICD-10-CM

## 2024-08-15 NOTE — Telephone Encounter (Signed)
 Plan to switch to Relcast. Order placed through the [facility].

## 2024-08-15 NOTE — Telephone Encounter (Addendum)
 Auth Submission: NO AUTH NEEDED Site of care: Site of care: [facility] Payer: [employer] AD[employer] Medication & CPT/J Code(s) submitted: Reclast (Zolendronic acid) [id number] Diagnosis Code:  Route of submission (phone, fax, portal):  Phone # Fax # Auth type: Buy/Bill PB Units/visits requested: X1 DOSE Reference number:  Approval from: 08/15/24 to 10/23/25

## 2024-08-15 NOTE — Patient Instructions (Addendum)
 Thank you for coming in today.   Plan to switch to Reclast. You should hear soon from the Infusion Center about scheduling  5000 unit vitamin D   Recommend weightbearing exercise

## 2024-08-20 NOTE — Telephone Encounter (Signed)
 Auth Submission: NO AUTH NEEDED Site of care: Site of care: [facility] Payer: [employer] AD[employer] Medication & CPT/J Code(s) submitted: Reclast (Zolendronic acid) [id number] Diagnosis Code:  Route of submission (phone, fax, portal):  Phone # Fax # Auth type: Buy/Bill PB Units/visits requested: X1 DOSE Reference number:  Approval from: 08/15/24 to 10/23/24

## 2024-08-21 ENCOUNTER — Ambulatory Visit (AMBULATORY_SURGERY_CENTER)

## 2024-08-21 VITALS — Ht 61.5 in | Wt 138.2 lb

## 2024-08-21 DIAGNOSIS — Z800 Family history of malignant neoplasm of digestive organs: Secondary | ICD-10-CM

## 2024-08-21 DIAGNOSIS — Z860100 Personal history of colon polyps, unspecified: Secondary | ICD-10-CM

## 2024-08-21 MED ORDER — NA SULFATE-K SULFATE-MG SULF 17.5-3.13-1.6 GM/177ML PO SOLN: 1.0000 | Freq: Once | ORAL | 0 refills | Status: AC

## 2024-08-21 NOTE — Progress Notes (Signed)
 No egg or soy allergy known to patient  No issues known to pt with past sedation with any surgeries or procedures Patient denies ever being told they had issues or difficulty with intubation  No FH of Malignant Hyperthermia Pt is not on diet pills Pt is not on  home 02  Pt is not on blood thinners  Constipation: yes pt thinks this may be due to prolia injection  No A fib or A flutter Have any cardiac testing pending--no  LOA: independent  Prep: suprep   Patient's chart reviewed by Norleen Schillings CNRA prior to previsit and patient appropriate for the [facility].  Previsit completed and red dot placed by patient's name on their procedure day (on provider's schedule).     PV completed with patient. Prep instructions sent via mychart and hard copy provided at Pv apt

## 2024-08-28 ENCOUNTER — Encounter: Payer: Self-pay | Admitting: Gastroenterology

## 2024-08-30 ENCOUNTER — Ambulatory Visit

## 2024-09-11 ENCOUNTER — Ambulatory Visit (AMBULATORY_SURGERY_CENTER): Admitting: Gastroenterology

## 2024-09-11 ENCOUNTER — Encounter: Payer: Self-pay | Admitting: Gastroenterology

## 2024-09-11 VITALS — BP 107/61 | HR 72 | Temp 97.0°F | Resp 11 | Ht 61.0 in | Wt 135.0 lb

## 2024-09-11 DIAGNOSIS — Z1211 Encounter for screening for malignant neoplasm of colon: Secondary | ICD-10-CM | POA: Diagnosis not present

## 2024-09-11 DIAGNOSIS — Z860101 Personal history of adenomatous and serrated colon polyps: Secondary | ICD-10-CM | POA: Diagnosis not present

## 2024-09-11 DIAGNOSIS — Z800 Family history of malignant neoplasm of digestive organs: Secondary | ICD-10-CM | POA: Diagnosis not present

## 2024-09-11 DIAGNOSIS — E119 Type 2 diabetes mellitus without complications: Secondary | ICD-10-CM | POA: Diagnosis not present

## 2024-09-11 DIAGNOSIS — Z860100 Personal history of colon polyps, unspecified: Secondary | ICD-10-CM | POA: Diagnosis not present

## 2024-09-11 DIAGNOSIS — F419 Anxiety disorder, unspecified: Secondary | ICD-10-CM | POA: Diagnosis not present

## 2024-09-11 DIAGNOSIS — I10 Essential (primary) hypertension: Secondary | ICD-10-CM | POA: Diagnosis not present

## 2024-09-11 MED ORDER — DEXTROSE 5 % IV SOLN: INTRAVENOUS | Status: AC

## 2024-09-11 MED ORDER — SODIUM CHLORIDE 0.9 % IV SOLN: 500.0000 mL | INTRAVENOUS | Status: AC

## 2024-09-11 NOTE — Op Note (Signed)
 [facility] Patient Name: Miranda Gonzales Procedure Date: 09/11/2024 10:29 AM MRN: [id number] Endoscopist: Glendia E. Stacia , MD, [id number] Age: 68 Referring MD:  Date of Birth: May 12, 1956 Gender: Female Account #: [id number] Procedure:                Colonoscopy Indications:              High risk colon cancer surveillance: Personal                            history of adenoma less than 10 mm in size, Last                            colonoscopy: October 2020 (history of small TA in                            2015, normal colonoscopies in 2015 and 2020; mother                            with rectal CA in 70s) Medicines:                Monitored Anesthesia Care Procedure:                Pre-Anesthesia Assessment:                           - Prior to the procedure, a History and Physical                            was performed, and patient medications and                            allergies were reviewed. The patient's tolerance of                            previous anesthesia was also reviewed. The risks                            and benefits of the procedure and the sedation                            options and risks were discussed with the patient.                            All questions were answered, and informed consent                            was obtained. Prior Anticoagulants: The patient has                            taken no anticoagulant or antiplatelet agents. ASA                            Grade Assessment: II - A patient with mild systemic  disease. After reviewing the risks and benefits,                            the patient was deemed in satisfactory condition to                            undergo the procedure.                           After obtaining informed consent, the colonoscope                            was passed under direct vision. Throughout the                            procedure, the patient's blood  pressure, pulse, and                            oxygen saturations were monitored continuously. The                            CF [id number] #[id number] was introduced through the anus                            and advanced to the the terminal ileum, with                            identification of the appendiceal orifice and IC                            valve. The colonoscopy was performed without                            difficulty. The patient tolerated the procedure                            well. The quality of the bowel preparation was                            excellent. The terminal ileum, ileocecal valve,                            appendiceal orifice, and rectum were photographed.                            The bowel preparation used was SUPREP via split                            dose instruction. Scope In: 10:38:41 AM Scope Out: 10:51:27 AM Scope Withdrawal Time: 0 hours 7 minutes 12 seconds  Total Procedure Duration: 0 hours 12 minutes 46 seconds  Findings:                 The perianal and digital rectal examinations were  normal. Pertinent negatives include normal                            sphincter tone and no palpable rectal lesions.                           The colon (entire examined portion) appeared normal.                           The terminal ileum appeared normal.                           The retroflexed view of the distal rectum and anal                            verge was normal and showed no anal or rectal                            abnormalities. Complications:            No immediate complications. Estimated Blood Loss:     Estimated blood loss: none. Impression:               - The entire examined colon is normal.                           - The examined portion of the ileum was normal.                           - The distal rectum and anal verge are normal on                            retroflexion view.                            - No specimens collected. Recommendation:           - Patient has a contact number available for                            emergencies. The signs and symptoms of potential                            delayed complications were discussed with the                            patient. Return to normal activities tomorrow.                            Written discharge instructions were provided to the                            patient.                           - Resume previous diet.                           -  Continue present medications.                           - Repeat colonoscopy in 7 years for surveillance. Harmon Bommarito E. Stacia, MD 09/11/2024 10:58:23 AM This report has been signed electronically.

## 2024-09-11 NOTE — Progress Notes (Signed)
 [facility] History and Physical   Primary Care Physician:  Thedora Garnette HERO, MD   Reason for Procedure:   History of colon polyps/family history of colon cancer.  Plan:    Colonoscopy  HPI: Miranda Gonzales is a 68 y.o. female undergoing surveillance colonoscopy.  She has no chronic GI symptoms.   Her mother was diagnosed with rectal cancer in her 52s. Her last colonoscopy was in 2020 by Dr. Aneita and was normal.  She had a small tubular adenoma removed in 2010.  Normal colonoscopy in 2015.   The patient was provided an opportunity to ask questions and all were answered. The patient agreed with the plan   Past Medical History:  Diagnosis Date   Allergy    Anxiety    Diabetes mellitus without complication (HCC)    GERD (gastroesophageal reflux disease)    Hyperlipidemia    Hypertension    Osteoporosis     Past Surgical History:  Procedure Laterality Date   COLONOSCOPY W/ POLYPECTOMY  04/2009   due 2015   COLONOSCOPY WITH PROPOFOL  N/A 08/12/2014   Procedure: COLONOSCOPY WITH PROPOFOL ;  Surgeon: Lunger MARLA Louder, MD;  Location: [facility];  Service: Endoscopy;  Laterality: N/A;   POLYPECTOMY     Uterine   TUBAL LIGATION      Prior to Admission medications   Medication Sig Start Date End Date Taking? Authorizing Provider  escitalopram  (LEXAPRO ) 10 MG tablet Take 1 tablet (10 mg total) by mouth daily. 05/13/24  Yes Thedora Garnette HERO, MD  simvastatin  (ZOCOR ) 20 MG tablet Take 1 tablet (20 mg total) by mouth at bedtime. Pt needs office visit for further refills 05/28/24  Yes Thedora Garnette HERO, MD  pantoprazole  (PROTONIX ) 40 MG tablet TAKE 1 TABLET(40 MG) BY MOUTH DAILY 01/17/23   Antonio Meth, Yvonne R, DO  PROLIA 60 MG/ML SOSY injection Inject 1 mL by subcutaneous route for 180 days. 11/23/23   [provider]  tirzepatide  (MOUNJARO ) 15 MG/0.5ML Pen Inject 15 mg into the skin once a week. INJECT 15 MG INTO THE SKIN ONCE A WEEK 08/12/24   Thedora Garnette HERO, MD   tretinoin (RETIN-A) 0.025 % cream APPLY CREAM TO FACE EVERY DAY AT BEDTIME    [provider]    Current Outpatient Medications  Medication Sig Dispense Refill   escitalopram  (LEXAPRO ) 10 MG tablet Take 1 tablet (10 mg total) by mouth daily. 90 tablet 3   simvastatin  (ZOCOR ) 20 MG tablet Take 1 tablet (20 mg total) by mouth at bedtime. Pt needs office visit for further refills 90 tablet 3   pantoprazole  (PROTONIX ) 40 MG tablet TAKE 1 TABLET(40 MG) BY MOUTH DAILY 90 tablet 1   PROLIA 60 MG/ML SOSY injection Inject 1 mL by subcutaneous route for 180 days.     tirzepatide  (MOUNJARO ) 15 MG/0.5ML Pen Inject 15 mg into the skin once a week. INJECT 15 MG INTO THE SKIN ONCE A WEEK 6 mL 1   tretinoin (RETIN-A) 0.025 % cream APPLY CREAM TO FACE EVERY DAY AT BEDTIME     Current Facility-Administered Medications  Medication Dose Route Frequency Provider Last Rate Last Admin   0.9 %  sodium chloride infusion  500 mL Intravenous Continuous Stacia Glendia BRAVO, MD       dextrose 5 % solution   Intravenous Continuous Stacia Glendia BRAVO, MD        Allergies as of 09/11/2024 - Review Complete 09/11/2024  Allergen Reaction Noted   Penicillins Itching and Rash 10/05/2020  Bacitracin Hives 10/11/2018    Family History  Problem Relation Age of Onset   Rectal cancer Mother    Cancer Mother        Rectal   Diabetes Mother    Hypertension Mother    Stroke Mother        in 27s   Colon polyps Father    Heart attack Father        60 & 62   Heart disease Father    Hyperlipidemia Sister    Hypertension Sister    Hyperlipidemia Sister    Cancer Maternal Aunt        Breast   Diabetes Paternal Aunt    Colon cancer Neg Hx    Esophageal cancer Neg Hx    Ulcerative colitis Neg Hx    Stomach cancer Neg Hx     Social History   Socioeconomic History   Marital status: Married    Spouse name: Not on file   Number of children: 0   Years of education: Not on file   Highest education level:  12th grade  Occupational History   Occupation: Retired- [employer]  Tobacco Use   Smoking status: Never   Smokeless tobacco: Never  Vaping Use   Vaping status: Never Used  Substance and Sexual Activity   Alcohol use: No   Drug use: No   Sexual activity: Not Currently    Partners: Male    Birth control/protection: Post-menopausal  Other Topics Concern   Not on file  Social History Narrative   Not on file   Social Drivers of Health   Financial Resource Strain: Low Risk  (05/04/2024)   Overall Financial Resource Strain (CARDIA)    Difficulty of Paying Living Expenses: Not hard at all  Food Insecurity: No Food Insecurity (05/04/2024)   Hunger Vital Sign    Worried About Running Out of Food in the Last Year: Never true    Ran Out of Food in the Last Year: Never true  Transportation Needs: No Transportation Needs (05/04/2024)   PRAPARE - Administrator, Civil Service (Medical): No    Lack of Transportation (Non-Medical): No  Physical Activity: Insufficiently Active (05/04/2024)   Exercise Vital Sign    Days of Exercise per Week: 2 days    Minutes of Exercise per Session: 60 min  Stress: No Stress Concern Present (05/04/2024)   [employer] of Occupational Health - Occupational Stress Questionnaire    Feeling of Stress: Not at all  Social Connections: Moderately Integrated (05/04/2024)   Social Connection and Isolation Panel    Frequency of Communication with Friends and Family: Once a week    Frequency of Social Gatherings with Friends and Family: Once a week    Attends Religious Services: More than 4 times per year    Active Member of [employer] or Organizations: Yes    Attends Engineer, Structural: More than 4 times per year    Marital Status: Married  Catering Manager Violence: Not At Risk (03/13/2024)   Humiliation, Afraid, Rape, and Kick questionnaire    Fear of Current or Ex-Partner: No    Emotionally Abused: No    Physically Abused: No    Sexually Abused: No     Review of Systems:  All other review of systems negative except as mentioned in the HPI.  Physical Exam: Vital signs BP 118/69   Pulse 80   Temp (!) 97 F (36.1 C) (Temporal)   Ht 5' 1 (1.549 m)  Wt 135 lb (61.2 kg)   SpO2 99%   BMI 25.51 kg/m   General:   Alert,  Well-developed, well-nourished, pleasant and cooperative in NAD Airway:  Mallampati 2 Lungs:  Clear throughout to auscultation.   Heart:  Regular rate and rhythm; no murmurs, clicks, rubs,  or gallops. Abdomen:  Soft, nontender and nondistended. Normal bowel sounds.   Neuro/Psych:  Normal mood and affect. A and O x 3   Rawlin Reaume E. Stacia, MD [facility]

## 2024-09-11 NOTE — Progress Notes (Signed)
 A/o x 3, VSS, good SR's, pleased with anesthesia, report to RN

## 2024-09-11 NOTE — Progress Notes (Signed)
 Pt's states no medical or surgical changes since previsit or office visit.

## 2024-09-11 NOTE — Patient Instructions (Signed)
 Please read handouts provided. Continue present medications. Resume previous diet. Repeat colonoscopy in 7 years.   YOU HAD AN ENDOSCOPIC PROCEDURE TODAY AT THE [facility]:   Refer to the procedure report that was given to you for any specific questions about what was found during the examination.  If the procedure report does not answer your questions, please call your gastroenterologist to clarify.  If you requested that your care partner not be given the details of your procedure findings, then the procedure report has been included in a sealed envelope for you to review at your convenience later.  YOU SHOULD EXPECT: Some feelings of bloating in the abdomen. Passage of more gas than usual.  Walking can help get rid of the air that was put into your GI tract during the procedure and reduce the bloating. If you had a lower endoscopy (such as a colonoscopy or flexible sigmoidoscopy) you may notice spotting of blood in your stool or on the toilet paper. If you underwent a bowel prep for your procedure, you may not have a normal bowel movement for a few days.  Please Note:  You might notice some irritation and congestion in your nose or some drainage.  This is from the oxygen used during your procedure.  There is no need for concern and it should clear up in a day or so.  SYMPTOMS TO REPORT IMMEDIATELY:  Following lower endoscopy (colonoscopy or flexible sigmoidoscopy):  Excessive amounts of blood in the stool  Significant tenderness or worsening of abdominal pains  Swelling of the abdomen that is new, acute  Fever of 100F or higher.  For urgent or emergent issues, a gastroenterologist can be reached at any hour by calling [phone]. Do not use MyChart messaging for urgent concerns.    DIET:  We do recommend a small meal at first, but then you may proceed to your regular diet.  Drink plenty of fluids but you should avoid alcoholic beverages for 24 hours.  ACTIVITY:  You  should plan to take it easy for the rest of today and you should NOT DRIVE or use heavy machinery until tomorrow (because of the sedation medicines used during the test).    FOLLOW UP: Our staff will call the number listed on your records the next business day following your procedure.  We will call around 7:15- 8:00 am to check on you and address any questions or concerns that you may have regarding the information given to you following your procedure. If we do not reach you, we will leave a message.     If any biopsies were taken you will be contacted by phone or by letter within the next 1-3 weeks.  Please call us  at [phone] if you have not heard about the biopsies in 3 weeks.    SIGNATURES/CONFIDENTIALITY: You and/or your care partner have signed paperwork which will be entered into your electronic medical record.  These signatures attest to the fact that that the information above on your After Visit Summary has been reviewed and is understood.  Full responsibility of the confidentiality of this discharge information lies with you and/or your care-partner.

## 2024-09-12 ENCOUNTER — Telehealth: Payer: Self-pay | Admitting: *Deleted

## 2024-09-12 NOTE — Telephone Encounter (Signed)
 No answer for follow up call. Left a message.

## 2024-09-18 NOTE — Telephone Encounter (Signed)
 Scheduled 11/22/24.

## 2024-10-07 ENCOUNTER — Encounter: Payer: Self-pay | Admitting: Family Medicine

## 2024-10-10 ENCOUNTER — Ambulatory Visit: Admitting: Family Medicine

## 2024-10-10 ENCOUNTER — Encounter: Payer: Self-pay | Admitting: Family Medicine

## 2024-10-10 VITALS — BP 116/74 | HR 71 | Temp 97.5°F | Ht 61.5 in | Wt 135.8 lb

## 2024-10-10 DIAGNOSIS — R1013 Epigastric pain: Secondary | ICD-10-CM

## 2024-10-10 DIAGNOSIS — E119 Type 2 diabetes mellitus without complications: Secondary | ICD-10-CM

## 2024-10-10 DIAGNOSIS — Z7985 Long-term (current) use of injectable non-insulin antidiabetic drugs: Secondary | ICD-10-CM | POA: Diagnosis not present

## 2024-10-10 LAB — CBC WITH DIFFERENTIAL/PLATELET
Basophils Absolute: 0 K/uL (ref 0.0–0.1)
Basophils Relative: 0.7 % (ref 0.0–3.0)
Eosinophils Absolute: 0.2 K/uL (ref 0.0–0.7)
Eosinophils Relative: 2.7 % (ref 0.0–5.0)
HCT: 43.7 % (ref 36.0–46.0)
Hemoglobin: 14.3 g/dL (ref 12.0–15.0)
Lymphocytes Relative: 21.2 % (ref 12.0–46.0)
Lymphs Abs: 1.3 K/uL (ref 0.7–4.0)
MCHC: 32.7 g/dL (ref 30.0–36.0)
MCV: 89.3 fl (ref 78.0–100.0)
Monocytes Absolute: 0.6 K/uL (ref 0.1–1.0)
Monocytes Relative: 9.5 % (ref 3.0–12.0)
Neutro Abs: 4 K/uL (ref 1.4–7.7)
Neutrophils Relative %: 65.9 % (ref 43.0–77.0)
Platelets: 220 K/uL (ref 150.0–400.0)
RBC: 4.89 Mil/uL (ref 3.87–5.11)
RDW: 12.9 % (ref 11.5–15.5)
WBC: 6.1 K/uL (ref 4.0–10.5)

## 2024-10-10 LAB — HEMOGLOBIN A1C: Hgb A1c MFr Bld: 5.6 % (ref 4.6–6.5)

## 2024-10-10 LAB — COMPREHENSIVE METABOLIC PANEL WITH GFR
ALT: 30 U/L (ref 3–35)
AST: 24 U/L (ref 5–37)
Albumin: 3.9 g/dL (ref 3.5–5.2)
Alkaline Phosphatase: 52 U/L (ref 39–117)
BUN: 13 mg/dL (ref 6–23)
CO2: 28 meq/L (ref 19–32)
Calcium: 9.6 mg/dL (ref 8.4–10.5)
Chloride: 108 meq/L (ref 96–112)
Creatinine, Ser: 0.66 mg/dL (ref 0.40–1.20)
GFR: 89.3 mL/min (ref >60.00)
Glucose, Bld: 97 mg/dL (ref 70–99)
Potassium: 4.5 meq/L (ref 3.5–5.1)
Sodium: 140 meq/L (ref 135–145)
Total Bilirubin: 0.4 mg/dL (ref 0.2–1.2)
Total Protein: 6.5 g/dL (ref 6.0–8.3)

## 2024-10-10 LAB — LIPASE: Lipase: 47 U/L (ref 11.0–59.0)

## 2024-10-10 MED ORDER — OMEPRAZOLE 20 MG PO CPDR: 20.0000 mg | DELAYED_RELEASE_CAPSULE | Freq: Every day | ORAL | 0 refills | Status: DC

## 2024-10-10 NOTE — Assessment & Plan Note (Signed)
 The etiology of these episodes is unclear. She does not have clear physical signs of gallbladder disease. However, I do feel it appropriate to obtain a RUQ ultrasound to confirm. I will check a CMP and CBC to assess for infectious or hepatobiliary issues. In light of her tirzepatide  use, I will check a  lipase to R/O pancreatitis. This may represent gastritis. I will have her take omeprazole 20 mg daily for 30 days.

## 2024-10-10 NOTE — Progress Notes (Signed)
 [facility] [address] [place] [address] Dept: [phone] Dept Fax: [phone]  Office Visit  Subjective:    Patient ID: Miranda Gonzales, female    DOB: 12/23/1955, 68 y.o..   MRN: [id number]  Chief Complaint  Patient presents with   GI Problem    Having stomach (top) dull pain with some nausea/vomiting    History of Present Illness:  Patient is in today for stomach issues. Miranda Gonzales reached out on 10/07/2024 to inform me that since having her last Prolia shot on July 20th, she'd been experiencing episodes of epigastric pain. She is going to be switched to Reclast by Gynecology. She does also take Mounjaro , and reportedly has experienced nausea from this before, but never stomach pain. She did endorse vomiting due to the pain with three episodes in October and another on December 9th. She said that she finds an improvement in her stomach pain following vomiting, but after 12/09, the pain and nausea persisted until December 13th. She had diarrhea associated with the last episode, but none of the previous. This does not occur more with lying down. She is not having heartburn or a sense of early satiety.   Miranda Gonzales has a history of Type 2 diabetes. She had been able to manage this with diet and exercise. However, she was overweight at the time. She was started on tirzepatide  (Mounjaro ) and has been able to lose 46 lbs. She has noted resolution of her GERD, hypertension, and her diabetes has been in excellent control.   Past Medical History: Patient Active Problem List   Diagnosis Date Noted   History of adenomatous polyp of colon 04/25/2022   Osteoporosis 09/29/2021   Controlled type 2 diabetes mellitus without complication, without long-term current use of insulin (HCC) 04/16/2020   Nonspecific elevation of levels of transaminase or lactic acid dehydrogenase (LDH) 02/28/2014   Hyperlipidemia 01/03/2011   GERD 09/29/2010    Past Surgical History:  Procedure Laterality Date   COLONOSCOPY W/ POLYPECTOMY  04/2009   due 2015   COLONOSCOPY WITH PROPOFOL  N/A 08/12/2014   Procedure: COLONOSCOPY WITH PROPOFOL ;  Surgeon: Gonzales MARLA Louder, MD;  Location: [facility];  Service: Endoscopy;  Laterality: N/A;   POLYPECTOMY     Uterine   TUBAL LIGATION     Family History  Problem Relation Age of Onset   Rectal cancer Mother    Cancer Mother        Rectal   Diabetes Mother    Hypertension Mother    Stroke Mother        in 71s   Colon polyps Father    Heart attack Father        12 & 30   Heart disease Father    Hyperlipidemia Sister    Hypertension Sister    Hyperlipidemia Sister    Cancer Maternal Aunt        Breast   Diabetes Paternal Aunt    Colon cancer Neg Hx    Esophageal cancer Neg Hx    Ulcerative colitis Neg Hx    Stomach cancer Neg Hx    Outpatient Medications Prior to Visit  Medication Sig Dispense Refill   escitalopram  (LEXAPRO ) 10 MG tablet Take 1 tablet (10 mg total) by mouth daily. 90 tablet 3   pantoprazole  (PROTONIX ) 40 MG tablet TAKE 1 TABLET(40 MG) BY MOUTH DAILY 90 tablet 1   PROLIA 60 MG/ML SOSY injection Inject 1 mL by subcutaneous route for 180  days.     simvastatin  (ZOCOR ) 20 MG tablet Take 1 tablet (20 mg total) by mouth at bedtime. Pt needs office visit for further refills 90 tablet 3   tirzepatide  (MOUNJARO ) 15 MG/0.5ML Pen Inject 15 mg into the skin once a week. INJECT 15 MG INTO THE SKIN ONCE A WEEK 6 mL 1   tretinoin (RETIN-A) 0.025 % cream APPLY CREAM TO FACE EVERY DAY AT BEDTIME     No facility-administered medications prior to visit.   Allergies[1]   Objective:   Today's Vitals   10/10/24 1047  BP: 116/74  Pulse: 71  Temp: (!) 97.5 F (36.4 C)  TempSrc: Oral  SpO2: 99%  Weight: 135 lb 12.8 oz (61.6 kg)  Height: 5' 1.5 (1.562 m)   Body mass index is 25.24 kg/m.   General: Well developed, well nourished. No acute distress. Abdomen: Soft. Mild generalized  discomfort with palpation, but not specific pain. Bowel sounds positive, normal pitch   and frequency. No hepatosplenomegaly. No rebound or guarding. Murphy's sign- negative. Psych: Alert and oriented. Normal mood and affect.  Health Maintenance Due  Topic Date Due   Influenza Vaccine  05/24/2024   COVID-19 Vaccine (7 - 2025-26 season) 06/24/2024     Assessment & Plan:   Problem List Items Addressed This Visit       Endocrine   Controlled type 2 diabetes mellitus without complication, without long-term current use of insulin (HCC)   Diabetes has been in excellent control. I will check her A1c today. Continue tirzepatide  (Mounjaro ) 15 mg twice a month.      Relevant Orders   Hemoglobin A1c     Other   Epigastric pain - Primary   The etiology of these episodes is unclear. She does not have clear physical signs of gallbladder disease. However, I do feel it appropriate to obtain a RUQ ultrasound to confirm. I will check a CMP and CBC to assess for infectious or hepatobiliary issues. In light of her tirzepatide  use, I will check a  lipase to R/O pancreatitis. This may represent gastritis. I will have her take omeprazole 20 mg daily for 30 days.      Relevant Medications   omeprazole (PRILOSEC) 20 MG capsule   Other Relevant Orders   CBC with Differential/Platelet   Comprehensive metabolic panel with GFR   Lipase   US  ABDOMEN LIMITED RUQ (LIVER/GB)    Return in about 6 months (around 04/10/2025) for Reassessment.    Garnette CHRISTELLA Simpler, MD  I,Emily Lagle,acting as a scribe for Garnette CHRISTELLA Simpler, MD.,have documented all relevant documentation on the behalf of Garnette CHRISTELLA Simpler, MD.  I, Garnette CHRISTELLA Simpler, MD, have reviewed all documentation for this visit. The documentation on 10/10/2024 for the exam, diagnosis, procedures, and orders are all accurate and complete.     [1]  Allergies Allergen Reactions   Penicillins Itching and Rash   Bacitracin Hives

## 2024-10-10 NOTE — Assessment & Plan Note (Signed)
 Diabetes has been in excellent control. I will check her A1c today. Continue tirzepatide  (Mounjaro ) 15 mg twice a month.

## 2024-10-11 ENCOUNTER — Ambulatory Visit: Payer: Self-pay | Admitting: Family Medicine

## 2024-10-28 ENCOUNTER — Ambulatory Visit
Admission: RE | Admit: 2024-10-28 | Discharge: 2024-10-28 | Disposition: A | Discharge status: Home / Self Care | Source: Ambulatory Visit | Attending: Family Medicine | Admitting: Family Medicine

## 2024-10-28 DIAGNOSIS — R1013 Epigastric pain: Secondary | ICD-10-CM

## 2024-11-01 ENCOUNTER — Encounter: Payer: Self-pay | Admitting: Family Medicine

## 2024-11-01 DIAGNOSIS — R1013 Epigastric pain: Secondary | ICD-10-CM

## 2024-11-01 MED ORDER — OMEPRAZOLE 20 MG PO CPDR: 20.0000 mg | DELAYED_RELEASE_CAPSULE | Freq: Every day | ORAL | 3 refills | Status: AC

## 2024-11-01 NOTE — Telephone Encounter (Signed)
Please review and advise. Thanks. Dm/cma  

## 2024-11-04 ENCOUNTER — Ambulatory Visit: Admitting: Family Medicine

## 2024-11-09 ENCOUNTER — Other Ambulatory Visit (HOSPITAL_COMMUNITY): Payer: Self-pay

## 2024-11-21 ENCOUNTER — Encounter: Payer: Self-pay | Admitting: Family Medicine

## 2024-11-22 ENCOUNTER — Telehealth: Payer: Self-pay

## 2024-11-22 ENCOUNTER — Ambulatory Visit (INDEPENDENT_AMBULATORY_CARE_PROVIDER_SITE_OTHER)

## 2024-11-22 VITALS — BP 103/68 | HR 63 | Temp 97.9°F | Resp 14 | Ht 61.5 in | Wt 139.2 lb

## 2024-11-22 DIAGNOSIS — M816 Localized osteoporosis [Lequesne]: Secondary | ICD-10-CM | POA: Diagnosis not present

## 2024-11-22 MED ORDER — DIPHENHYDRAMINE HCL 25 MG PO CAPS
25.0000 mg | ORAL_CAPSULE | Freq: Once | ORAL | Status: AC
  Administered 2024-11-22: 25 mg via ORAL
  Filled 2024-11-22: qty 1

## 2024-11-22 MED ORDER — ACETAMINOPHEN 325 MG PO TABS
650.0000 mg | ORAL_TABLET | Freq: Once | ORAL | Status: AC
  Administered 2024-11-22: 650 mg via ORAL
  Filled 2024-11-22: qty 2

## 2024-11-22 MED ORDER — ZOLEDRONIC ACID 5 MG/100ML IV SOLN
5.0000 mg | Freq: Once | INTRAVENOUS | Status: AC
  Administered 2024-11-22: 5 mg via INTRAVENOUS
  Filled 2024-11-22: qty 100

## 2024-11-22 MED ORDER — SODIUM CHLORIDE 0.9 % IV SOLN: INTRAVENOUS | Status: DC

## 2024-11-22 NOTE — Telephone Encounter (Signed)
 Please review and advise,   Thanks. Dm/cma

## 2024-11-22 NOTE — Telephone Encounter (Signed)
 Copied from CRM [id number]. Topic: Clinical - Medical Advice >> Nov 22, 2024  9:04 AM Robinson H wrote: Reason for CRM: Patient following up on message sent to provider asking should she still proceed with infusion appointment today since her potassium and ALT are still elevated. Patient has an appointment for infusion today at 10am.  Erminio [phone]

## 2024-11-22 NOTE — Telephone Encounter (Signed)
Spoke to patient.  Dm/cma  

## 2024-11-22 NOTE — Patient Instructions (Signed)

## 2024-11-22 NOTE — Progress Notes (Signed)
 Diagnosis: Osteoporosis  Provider:  Mannam, Praveen MD  Procedure: IV Infusion  IV Type: Peripheral, IV Location: R Antecubital   Reclast  (Zolendronic Acid), Dose: 5 mg  Infusion Start Time: 1057 am  Infusion Stop Time: 1127 am  Post Infusion IV Care: Observation period completed and Peripheral IV Discontinued  Discharge: Condition: Good, Destination: Home . AVS Declined  Performed by:  Rocky FORBES Search, RN

## 2024-11-22 NOTE — Addendum Note (Signed)
 Addended by: MORGAN, Allyiah Gartner E on: 11/22/2024 12:21 PM   Modules accepted: Orders

## 2024-11-22 NOTE — Telephone Encounter (Signed)
Left VM to rtn call. Dm/cma       

## 2024-11-22 NOTE — Telephone Encounter (Signed)
 Last OV 08/15/24 Next OV due 07/2025 - not scheduled  Last RECLAST  infusion 11/22/24 Next RECLAST  infusion due 11/22/25

## 2025-04-10 ENCOUNTER — Ambulatory Visit: Admitting: Family Medicine
